# Patient Record
Sex: Male | Born: 1965 | Race: Black or African American | Hispanic: No | Marital: Single | Smoking: Former smoker
Health system: Southern US, Community
[De-identification: ages and names within clinical notes are randomized; demographics above are authoritative.]

## PROBLEM LIST (undated history)

## (undated) DIAGNOSIS — I10 Essential (primary) hypertension: Secondary | ICD-10-CM

## (undated) DIAGNOSIS — E785 Hyperlipidemia, unspecified: Secondary | ICD-10-CM

## (undated) DIAGNOSIS — I2510 Atherosclerotic heart disease of native coronary artery without angina pectoris: Secondary | ICD-10-CM

## (undated) HISTORY — DX: Hyperlipidemia, unspecified: E78.5

## (undated) HISTORY — PX: CARDIAC CATHETERIZATION: SHX172

## (undated) HISTORY — DX: Atherosclerotic heart disease of native coronary artery without angina pectoris: I25.10

---

## 2008-03-02 ENCOUNTER — Emergency Department: Payer: Self-pay | Admitting: Internal Medicine

## 2009-05-19 ENCOUNTER — Emergency Department: Payer: Self-pay | Admitting: Emergency Medicine

## 2009-11-10 ENCOUNTER — Ambulatory Visit: Payer: Self-pay | Admitting: Cardiovascular Disease

## 2009-11-10 ENCOUNTER — Inpatient Hospital Stay: Payer: Self-pay | Admitting: Internal Medicine

## 2011-11-02 ENCOUNTER — Emergency Department: Payer: Self-pay | Admitting: Emergency Medicine

## 2016-08-11 ENCOUNTER — Encounter: Payer: Self-pay | Admitting: Emergency Medicine

## 2016-08-11 DIAGNOSIS — F17210 Nicotine dependence, cigarettes, uncomplicated: Secondary | ICD-10-CM | POA: Insufficient documentation

## 2016-08-11 DIAGNOSIS — N390 Urinary tract infection, site not specified: Secondary | ICD-10-CM | POA: Insufficient documentation

## 2016-08-11 DIAGNOSIS — Z5181 Encounter for therapeutic drug level monitoring: Secondary | ICD-10-CM | POA: Insufficient documentation

## 2016-08-11 DIAGNOSIS — I10 Essential (primary) hypertension: Secondary | ICD-10-CM | POA: Insufficient documentation

## 2016-08-11 NOTE — ED Triage Notes (Signed)
Pt presents to ED with headache and elevated blood pressure for the past few days. Pt states he has been taking his medications as prescribed. Pt currently has clear speech and steady gait. No distress noted. Appears slighly anxious. Very talkative in triage.

## 2016-08-12 ENCOUNTER — Emergency Department
Admission: EM | Admit: 2016-08-12 | Discharge: 2016-08-12 | Disposition: A | Discharge status: Home / Self Care | Payer: Medicare Other | Attending: Emergency Medicine | Admitting: Emergency Medicine

## 2016-08-12 ENCOUNTER — Emergency Department: Payer: Medicare Other

## 2016-08-12 DIAGNOSIS — I10 Essential (primary) hypertension: Secondary | ICD-10-CM

## 2016-08-12 DIAGNOSIS — N390 Urinary tract infection, site not specified: Secondary | ICD-10-CM

## 2016-08-12 HISTORY — DX: Essential (primary) hypertension: I10

## 2016-08-12 LAB — URINALYSIS, COMPLETE (UACMP) WITH MICROSCOPIC
Bilirubin Urine: NEGATIVE
Glucose, UA: NEGATIVE mg/dL
Ketones, ur: NEGATIVE mg/dL
Nitrite: NEGATIVE
Protein, ur: NEGATIVE mg/dL
Specific Gravity, Urine: 1.021 (ref 1.005–1.030)
pH: 5 (ref 5.0–8.0)

## 2016-08-12 LAB — TROPONIN I: Troponin I: <0.03 ng/mL (ref <0.03)

## 2016-08-12 LAB — URINE DRUG SCREEN, QUALITATIVE (ARMC ONLY)
Amphetamines, Ur Screen: NOT DETECTED
Barbiturates, Ur Screen: NOT DETECTED
Benzodiazepine, Ur Scrn: NOT DETECTED
Cannabinoid 50 Ng, Ur ~~LOC~~: NOT DETECTED
Cocaine Metabolite,Ur ~~LOC~~: NOT DETECTED
MDMA (Ecstasy)Ur Screen: NOT DETECTED
Methadone Scn, Ur: NOT DETECTED
Opiate, Ur Screen: NOT DETECTED
Phencyclidine (PCP) Ur S: NOT DETECTED
Tricyclic, Ur Screen: NOT DETECTED

## 2016-08-12 LAB — CBC WITH DIFFERENTIAL/PLATELET
Basophils Absolute: 0 10*3/uL (ref 0–0.1)
Basophils Relative: 1 %
Eosinophils Absolute: 0.2 10*3/uL (ref 0–0.7)
Eosinophils Relative: 3 %
HCT: 43.4 % (ref 40.0–52.0)
Hemoglobin: 15.2 g/dL (ref 13.0–18.0)
Lymphocytes Relative: 52 %
Lymphs Abs: 4 10*3/uL — ABNORMAL HIGH (ref 1.0–3.6)
MCH: 30.3 pg (ref 26.0–34.0)
MCHC: 35.2 g/dL (ref 32.0–36.0)
MCV: 86.3 fL (ref 80.0–100.0)
Monocytes Absolute: 0.6 10*3/uL (ref 0.2–1.0)
Monocytes Relative: 8 %
Neutro Abs: 2.8 10*3/uL (ref 1.4–6.5)
Neutrophils Relative %: 36 %
Platelets: 239 10*3/uL (ref 150–440)
RBC: 5.02 MIL/uL (ref 4.40–5.90)
RDW: 14.9 % — ABNORMAL HIGH (ref 11.5–14.5)
WBC: 7.7 10*3/uL (ref 3.8–10.6)

## 2016-08-12 LAB — BASIC METABOLIC PANEL
Anion gap: 6 (ref 5–15)
BUN: 14 mg/dL (ref 6–20)
CO2: 25 mmol/L (ref 22–32)
Calcium: 8.7 mg/dL — ABNORMAL LOW (ref 8.9–10.3)
Chloride: 107 mmol/L (ref 101–111)
Creatinine, Ser: 0.86 mg/dL (ref 0.61–1.24)
GFR calc Af Amer: >60 mL/min (ref >60)
GFR calc non Af Amer: >60 mL/min (ref >60)
Glucose, Bld: 124 mg/dL — ABNORMAL HIGH (ref 65–99)
Potassium: 3.5 mmol/L (ref 3.5–5.1)
Sodium: 138 mmol/L (ref 135–145)

## 2016-08-12 MED ORDER — DOXYCYCLINE HYCLATE 100 MG PO TABS
100.0000 mg | ORAL_TABLET | Freq: Once | ORAL | Status: AC
  Administered 2016-08-12: 100 mg via ORAL
  Filled 2016-08-12: qty 1

## 2016-08-12 MED ORDER — ACETAMINOPHEN 325 MG PO TABS
650.0000 mg | ORAL_TABLET | Freq: Once | ORAL | Status: AC
  Administered 2016-08-12: 650 mg via ORAL
  Filled 2016-08-12: qty 2

## 2016-08-12 MED ORDER — CLONIDINE HCL 0.1 MG PO TABS
0.1000 mg | ORAL_TABLET | Freq: Once | ORAL | Status: AC
  Administered 2016-08-12: 0.1 mg via ORAL
  Filled 2016-08-12: qty 1

## 2016-08-12 MED ORDER — DOXYCYCLINE HYCLATE 50 MG PO CAPS: 100.0000 mg | ORAL_CAPSULE | Freq: Two times a day (BID) | ORAL | 0 refills | Status: DC

## 2016-08-12 NOTE — ED Notes (Signed)
Pt went to x-ray.

## 2016-08-12 NOTE — ED Provider Notes (Signed)
[facility] Emergency Department Provider Note   ____________________________________________   First MD Initiated Contact with Patient 08/12/16 0026     (approximate)  I have reviewed the triage vital signs and the nursing notes.   HISTORY  Chief Complaint Hypertension and Headache    HPI Jeff Rios is a 51 y.o. male with a history of hypertension who presents to the ED from home with a chief complaint of generalized headache secondary to elevated blood pressure. Patient does not recall the name of his antihypertensive, but states "when I'm feeling good eye don't take it like should". Reports he has been taking his BP medicine daily for the past 1-2 weeks. Noted generalized headache for the past several days, which is his every that his blood pressure is elevated. Patient has not been taking his blood pressure at home and does not know what his baseline blood pressure is. Aside from the mild headache, patient denies associated vision changes, neck pain, chest pain, shortness of breath, abdominal pain, nausea, vomiting, dizziness, numbness or tingling. Denies recent travel or trauma. Has not taken anything for his headache.Admits to increased stressors recently.   Past Medical History:  Diagnosis Date  . Hypertension     There are no active problems to display for this patient.   History reviewed. No pertinent surgical history.  Prior to Admission medications   Not on File    Allergies Patient has no known allergies.  No family history on file.  Social History Social History  Substance Use Topics  . Smoking status: Current Every Day Smoker    Packs/day: 1.00    Types: Cigarettes  . Smokeless tobacco: Never Used  . Alcohol use No  Denies illicit drug use  Review of Systems  Constitutional: No fever/chills. Eyes: No visual changes. ENT: No sore throat. Cardiovascular: Denies chest pain. Respiratory: Denies shortness of  breath. Gastrointestinal: No abdominal pain.  No nausea, no vomiting.  No diarrhea.  No constipation. Genitourinary: Negative for dysuria. Musculoskeletal: Negative for back pain. Skin: Negative for rash. Neurological: Positive for headache. Negative for focal weakness or numbness.  10-point ROS otherwise negative.  ____________________________________________   PHYSICAL EXAM:  VITAL SIGNS: ED Triage Vitals  Enc Vitals Group     BP 08/11/16 2136 (!) 151/95     Pulse Rate 08/11/16 2136 82     Resp 08/11/16 2136 18     Temp 08/11/16 2136 98.2 F (36.8 C)     Temp Source 08/11/16 2136 Oral     SpO2 08/11/16 2136 97 %     Weight 08/11/16 2137 160 lb (72.6 kg)     Height 08/11/16 2137  (1.6 m)     Head Circumference --      Peak Flow --      Pain Score 08/11/16 2135 8     Pain Loc --      Pain Edu? --      Excl. in GC? --     Constitutional: Alert and oriented. Well appearing and in no acute distress. Eyes: Conjunctivae are normal. PERRL. EOMI. Head: Atraumatic. Nose: No congestion/rhinnorhea. Mouth/Throat: Mucous membranes are moist.  Oropharynx non-erythematous. Neck: No stridor.  No carotid bruits.  Supple neck without meningismus. Cardiovascular: Normal rate, regular rhythm. Grossly normal heart sounds.  Good peripheral circulation. Respiratory: Normal respiratory effort.  No retractions. Lungs CTAB. Gastrointestinal: Soft and nontender. No distention. No abdominal bruits. No CVA tenderness. Musculoskeletal: No lower extremity tenderness nor edema.  No joint effusions.  Neurologic:  Normal speech and language. No gross focal neurologic deficits are appreciated. MAEx4. No gait instability. Skin:  Skin is warm, dry and intact. No rash noted. Psychiatric: Mood and affect are normal. Speech and behavior are normal.  ____________________________________________   LABS (all labs ordered are listed, but only abnormal results are displayed)  Labs Reviewed  CBC WITH  DIFFERENTIAL/PLATELET - Abnormal; Notable for the following:       Result Value   RDW 14.9 (*)    Lymphs Abs 4.0 (*)    All other components within normal limits  BASIC METABOLIC PANEL - Abnormal; Notable for the following:    Glucose, Bld 124 (*)    Calcium 8.7 (*)    All other components within normal limits  URINALYSIS, COMPLETE (UACMP) WITH MICROSCOPIC - Abnormal; Notable for the following:    Color, Urine YELLOW (*)    APPearance HAZY (*)    Hgb urine dipstick SMALL (*)    Leukocytes, UA MODERATE (*)    Bacteria, UA RARE (*)    Squamous Epithelial / LPF 0-5 (*)    Crystals PRESENT (*)    All other components within normal limits  TROPONIN I  URINE DRUG SCREEN, QUALITATIVE ([facility] ONLY)   ____________________________________________  EKG  ED ECG REPORT I, Kylani Wires J, the attending physician, personally viewed and interpreted this ECG.   Date: 08/12/2016  EKG Time: 0054  Rate: 78  Rhythm: normal EKG, normal sinus rhythm  Axis: RAD  Intervals:none  ST&T Change: Nonspecific  ____________________________________________  RADIOLOGY  Chest X-ray interpreted per Dr. Cherly Hensen: No acute cardiopulmonary process seen. ____________________________________________   PROCEDURES  Procedure(s) performed: None  Procedures  Critical Care performed: No  ____________________________________________   INITIAL IMPRESSION / ASSESSMENT AND PLAN / ED COURSE  Pertinent labs & imaging results that were available during my care of the patient were reviewed by me and considered in my medical decision making (see chart for details).  51 year old male with a history of hypertension who presents for generalized headache secondary to elevated blood pressure. He is neurologically intact without focal deficits, and denies illicit drug use. Do not feel CT head is warranted at this time. Will obtain screening lab work, administer clonidine and reassess.  Clinical Course as of Aug 13 315    Wed Aug 12, 2016  1610 Delay secondary to lab processing urine. Updated patient on laboratory urinalysis results. Will place him on doxycycline. Blood pressure currently 118/82 without intervention. Strict return precautions given. Patient verbalizes understanding and agrees with plan of care.  [JS]    Clinical Course User Index [JS] Irean Hong, MD     ____________________________________________   FINAL CLINICAL IMPRESSION(S) / ED DIAGNOSES  Final diagnoses:  Essential hypertension  Urinary tract infection without hematuria, site unspecified      NEW MEDICATIONS STARTED DURING THIS VISIT:  New Prescriptions   No medications on file     Note:  This document was prepared using Dragon voice recognition software and may include unintentional dictation errors.    Irean Hong, MD 08/12/16 [phone]

## 2016-08-12 NOTE — Discharge Instructions (Signed)
1. Take antibiotic as prescribed (doxycycline 100 mg twice daily 7 days). 2. Continue to take your blood pressure medications daily as directed by your doctor.  3. Return to the ER for worsening symptoms, persistent vomiting, difficulty breathing or other concerns.

## 2016-08-12 NOTE — ED Notes (Signed)
Pt returned from xray

## 2018-10-15 ENCOUNTER — Emergency Department
Admission: EM | Admit: 2018-10-15 | Discharge: 2018-10-15 | Disposition: A | Discharge status: Home / Self Care | Payer: Medicare Other | Attending: Emergency Medicine | Admitting: Emergency Medicine

## 2018-10-15 ENCOUNTER — Encounter: Payer: Self-pay | Admitting: Emergency Medicine

## 2018-10-15 ENCOUNTER — Other Ambulatory Visit: Payer: Self-pay

## 2018-10-15 DIAGNOSIS — K641 Second degree hemorrhoids: Secondary | ICD-10-CM | POA: Diagnosis not present

## 2018-10-15 DIAGNOSIS — I10 Essential (primary) hypertension: Secondary | ICD-10-CM | POA: Insufficient documentation

## 2018-10-15 DIAGNOSIS — F17210 Nicotine dependence, cigarettes, uncomplicated: Secondary | ICD-10-CM | POA: Diagnosis not present

## 2018-10-15 DIAGNOSIS — K6289 Other specified diseases of anus and rectum: Secondary | ICD-10-CM | POA: Diagnosis present

## 2018-10-15 MED ORDER — HYDROCODONE-ACETAMINOPHEN 5-325 MG PO TABS: 1.0000 | ORAL_TABLET | Freq: Four times a day (QID) | ORAL | 0 refills | Status: AC | PRN

## 2018-10-15 MED ORDER — DIBUCAINE (PERIANAL) 1 % EX OINT: 1.0000 "application " | TOPICAL_OINTMENT | CUTANEOUS | 0 refills | Status: AC | PRN

## 2018-10-15 MED ORDER — HYDROCORTISONE ACETATE 25 MG RE SUPP: 25.0000 mg | Freq: Two times a day (BID) | RECTAL | 1 refills | Status: DC

## 2018-10-15 MED ORDER — ONDANSETRON 4 MG PO TBDP
4.0000 mg | ORAL_TABLET | Freq: Once | ORAL | Status: AC
  Administered 2018-10-15: 4 mg via ORAL
  Filled 2018-10-15: qty 1

## 2018-10-15 MED ORDER — HYDROCODONE-ACETAMINOPHEN 5-325 MG PO TABS
1.0000 | ORAL_TABLET | Freq: Once | ORAL | Status: AC
  Administered 2018-10-15: 1 via ORAL
  Filled 2018-10-15: qty 1

## 2018-10-15 MED ORDER — IBUPROFEN 800 MG PO TABS
800.0000 mg | ORAL_TABLET | Freq: Once | ORAL | Status: AC
  Administered 2018-10-15: 800 mg via ORAL
  Filled 2018-10-15: qty 1

## 2018-10-15 NOTE — ED Triage Notes (Signed)
Pt arrived via POV with reports of hemorrhoid pain states worse since yesterday, pt states he has used Anusol in the past which helped, but does not have any more.  Pt has tried using warm water. Pt requesting something to make them go away and not return. Has hx of hemorrhoids.

## 2018-10-15 NOTE — ED Provider Notes (Signed)
[facility] Emergency Department Provider Note  ____________________________________________  Time seen: Approximately 3:32 PM  I have reviewed the triage vital signs and the nursing notes.   HISTORY  Chief Complaint Rectal Pain (Hemorrhoids)    HPI Jeff Rios is a 53 y.o. male presents to the emergency department with 8 out of 10 acute hemorrhoid pain.  Patient states that he has a history of hemorrhoids and has always had to use Anusol in the past.  Patient states that he is out of Anusol.  Patient states that he only feels hemorrhoid pain with straining.  Pain became worse last night patient became concerned.  He denies recent constipation.  No other alleviating measures have been attempted.        Past Medical History:  Diagnosis Date  . Hypertension     There are no active problems to display for this patient.   History reviewed. No pertinent surgical history.  Prior to Admission medications   Medication Sig Start Date End Date Taking? Authorizing Provider  dibucaine (NUPERCAINAL) 1 % OINT Place 1 application rectally as needed for up to 5 days for hemorrhoids. 10/15/18 10/20/18  Orvil FeilWoods, Harlean Regula M, PA-C  doxycycline (VIBRAMYCIN) 50 MG capsule Take 2 capsules (100 mg total) by mouth 2 (two) times daily. 08/12/16   Irean HongSung, Jade J, MD  HYDROcodone-acetaminophen (NORCO) 5-325 MG tablet Take 1 tablet by mouth every 6 (six) hours as needed for up to 2 days for moderate pain. 10/15/18 10/17/18  Orvil FeilWoods, Valleri Hendricksen M, PA-C  hydrocortisone (ANUSOL-HC) 25 MG suppository Place 1 suppository (25 mg total) rectally every 12 (twelve) hours. 10/15/18 10/15/19  Orvil FeilWoods, Leeland Lovelady M, PA-C    Allergies Patient has no known allergies.  History reviewed. No pertinent family history.  Social History Social History   Tobacco Use  . Smoking status: Current Every Day Smoker    Packs/day: 1.00    Types: Cigarettes  . Smokeless tobacco: Never Used  Substance Use Topics  .  Alcohol use: No  . Drug use: No     Review of Systems  Constitutional: No fever/chills Eyes: No visual changes. No discharge ENT: No upper respiratory complaints. Cardiovascular: no chest pain. Respiratory: no cough. No SOB. Gastrointestinal: No abdominal pain.  No nausea, no vomiting.  No diarrhea.  No constipation.  Patient has hemorrhoid pain. Genitourinary: Negative for dysuria. No hematuria Musculoskeletal: Negative for musculoskeletal pain. Neurological: Negative for headaches, focal weakness or numbness.   ____________________________________________   PHYSICAL EXAM:  VITAL SIGNS: ED Triage Vitals  Enc Vitals Group     BP 10/15/18 1237 (!) 161/82     Pulse Rate 10/15/18 1237 (!) 102     Resp 10/15/18 1237 16     Temp 10/15/18 1237 98.6 F (37 C)     Temp Source 10/15/18 1237 Oral     SpO2 10/15/18 1237 98 %     Weight 10/15/18 1237 170 lb (77.1 kg)     Height 10/15/18 1237 5\' 4"  (1.626 m)     Head Circumference --      Peak Flow --      Pain Score 10/15/18 1252 7     Pain Loc --      Pain Edu? --      Excl. in GC? --      Constitutional: Alert and oriented. Well appearing and in no acute distress. Eyes: Conjunctivae are normal. PERRL. EOMI. Head: Atraumatic. Cardiovascular: Normal rate, regular rhythm. Normal S1 and S2.  Good peripheral circulation. Respiratory:  Normal respiratory effort without tachypnea or retractions. Lungs CTAB. Good air entry to the bases with no decreased or absent breath sounds. Gastrointestinal: Bowel sounds 4 quadrants. Soft and nontender to palpation. No guarding or rigidity. No palpable masses. No distention. No CVA tenderness.  Patient has a grade 2 hemorrhoids. Musculoskeletal: Full range of motion to all extremities. No gross deformities appreciated. Neurologic:  Normal speech and language. No gross focal neurologic deficits are appreciated.  Skin:  Skin is warm, dry and intact. No rash noted. Psychiatric: Mood and affect are  normal. Speech and behavior are normal. Patient exhibits appropriate insight and judgement.   ____________________________________________   LABS (all labs ordered are listed, but only abnormal results are displayed)  Labs Reviewed - No data to display ____________________________________________  EKG   ____________________________________________  RADIOLOGY   No results found.  ____________________________________________    PROCEDURES  Procedure(s) performed:    Procedures    Medications  HYDROcodone-acetaminophen (NORCO/VICODIN) 5-325 MG per tablet 1 tablet (1 tablet Oral Given 10/15/18 1540)  ibuprofen (ADVIL) tablet 800 mg (800 mg Oral Given 10/15/18 1541)  ondansetron (ZOFRAN-ODT) disintegrating tablet 4 mg (4 mg Oral Given 10/15/18 1541)     ____________________________________________   INITIAL IMPRESSION / ASSESSMENT AND PLAN / ED COURSE  Pertinent labs & imaging results that were available during my care of the patient were reviewed by me and considered in my medical decision making (see chart for details).  Review of the [place] CSRS was performed in accordance of the [place] prior to dispensing any controlled drugs.           Assessment and Plan:  Hemorrhoids Patient presents to the emergency department with hemorrhoid pain.  Patient has grade 2 hemorrhoids on physical exam.  Patient was discharged with Anusol, dibucaine and a short course of Norco.  He was advised to follow-up with general surgery as needed.  He voiced understanding regarding post ED care.  All patient questions were answered.    ____________________________________________  FINAL CLINICAL IMPRESSION(S) / ED DIAGNOSES  Final diagnoses:  Grade II hemorrhoids      NEW MEDICATIONS STARTED DURING THIS VISIT:  ED Discharge Orders         Ordered    hydrocortisone (ANUSOL-HC) 25 MG suppository  Every 12 hours     10/15/18 1557    dibucaine (NUPERCAINAL) 1 % OINT  As needed      10/15/18 1557    HYDROcodone-acetaminophen (NORCO) 5-325 MG tablet  Every 6 hours PRN     10/15/18 1557              This chart was dictated using voice recognition software/Dragon. Despite best efforts to proofread, errors can occur which can change the meaning. Any change was purely unintentional.    Lannie Fields, PA-C 10/15/18 1600    Harvest Dark, MD 10/15/18 2141

## 2018-10-15 NOTE — Discharge Instructions (Signed)
You have been diagnosed with hemorrhoids. You can apply dibucaine ointment for rectal pain. Use Anusol. Make sure to take Epson salt baths.

## 2019-03-05 ENCOUNTER — Emergency Department
Admission: EM | Admit: 2019-03-05 | Discharge: 2019-03-06 | Disposition: A | Discharge status: Home / Self Care | Payer: Medicare Other | Attending: Emergency Medicine | Admitting: Emergency Medicine

## 2019-03-05 ENCOUNTER — Emergency Department: Payer: Medicare Other

## 2019-03-05 ENCOUNTER — Encounter: Payer: Self-pay | Admitting: Emergency Medicine

## 2019-03-05 ENCOUNTER — Other Ambulatory Visit: Payer: Self-pay

## 2019-03-05 DIAGNOSIS — Z79899 Other long term (current) drug therapy: Secondary | ICD-10-CM | POA: Insufficient documentation

## 2019-03-05 DIAGNOSIS — T783XXA Angioneurotic edema, initial encounter: Secondary | ICD-10-CM | POA: Insufficient documentation

## 2019-03-05 DIAGNOSIS — F17210 Nicotine dependence, cigarettes, uncomplicated: Secondary | ICD-10-CM | POA: Insufficient documentation

## 2019-03-05 DIAGNOSIS — I10 Essential (primary) hypertension: Secondary | ICD-10-CM | POA: Diagnosis not present

## 2019-03-05 MED ORDER — EPINEPHRINE 0.3 MG/0.3ML IJ SOAJ: 0.3000 mg | INTRAMUSCULAR | 1 refills | Status: AC | PRN

## 2019-03-05 MED ORDER — METOPROLOL TARTRATE 25 MG PO TABS: 12.5000 mg | ORAL_TABLET | Freq: Two times a day (BID) | ORAL | 0 refills | Status: DC

## 2019-03-05 MED ORDER — DIPHENHYDRAMINE HCL 50 MG/ML IJ SOLN
12.5000 mg | Freq: Once | INTRAMUSCULAR | Status: AC
  Administered 2019-03-05: 12.5 mg via INTRAVENOUS
  Filled 2019-03-05: qty 1

## 2019-03-05 MED ORDER — SODIUM CHLORIDE 0.9 % IV BOLUS
1000.0000 mL | Freq: Once | INTRAVENOUS | Status: AC
  Administered 2019-03-05: 1000 mL via INTRAVENOUS

## 2019-03-05 MED ORDER — HYDROCHLOROTHIAZIDE 12.5 MG PO TABS: 12.5000 mg | ORAL_TABLET | Freq: Every day | ORAL | 0 refills | Status: DC

## 2019-03-05 MED ORDER — METHYLPREDNISOLONE SODIUM SUCC 125 MG IJ SOLR
125.0000 mg | Freq: Once | INTRAMUSCULAR | Status: AC
  Administered 2019-03-05: 125 mg via INTRAVENOUS
  Filled 2019-03-05: qty 2

## 2019-03-05 MED ORDER — FAMOTIDINE IN NACL 20-0.9 MG/50ML-% IV SOLN
20.0000 mg | Freq: Once | INTRAVENOUS | Status: AC
  Administered 2019-03-05: 20 mg via INTRAVENOUS
  Filled 2019-03-05: qty 50

## 2019-03-05 MED ORDER — PREDNISONE 20 MG PO TABS: 40.0000 mg | ORAL_TABLET | Freq: Every day | ORAL | 0 refills | Status: AC

## 2019-03-05 NOTE — Discharge Instructions (Addendum)
You presented with swelling.  You should take the steroids for the next 5 days.  It is possible that this could be secondary to the amlodipine so we will discontinue this.  We have restarted a new medication to help with your blood pressure called metoprolol. Wait into the swelling comes down to restart this just so that we can tell if there is a reaction to it.  You should follow with your primary care doctor in one week to have BP check and they can increase medications if needed.   Use the EpiPen only for a new reaction that causes shortness of breath.

## 2019-03-05 NOTE — ED Provider Notes (Addendum)
[facility] Emergency Department Provider Note  ____________________________________________   First MD Initiated Contact with Patient 03/05/19 [phone]     (approximate)  I have reviewed the triage vital signs and the nursing notes.   HISTORY  Chief Complaint Allergic Reaction    HPI Jeff Rios is a 53 y.o. male with hypertension on amlodipine who presents with allergic reaction.  Patient does not remember being stung by anything but around 430 he looked in the mirror and noted that he had increased swelling of his lips, tongue, around his eyes.  He denies any shortness of breath or feeling any scratchiness in his throat.  Denies any wheezing or stridor.  He denies ever having this previous.  Denies any ACE inhibitor use.  Swelling onset 430, constant, staying about constant, nothing brought it on, EMS gave 50 of p.o. Benadryl        Past Medical History:  Diagnosis Date  . Hypertension     There are no active problems to display for this patient.   No past surgical history on file.  Prior to Admission medications   Medication Sig Start Date End Date Taking? Authorizing Provider  doxycycline (VIBRAMYCIN) 50 MG capsule Take 2 capsules (100 mg total) by mouth 2 (two) times daily. 08/12/16   Irean Hong, MD  hydrocortisone (ANUSOL-HC) 25 MG suppository Place 1 suppository (25 mg total) rectally every 12 (twelve) hours. 10/15/18 10/15/19  Orvil Feil, PA-C    Allergies Patient has no known allergies.  No family history on file.  Social History Social History   Tobacco Use  . Smoking status: Current Every Day Smoker    Packs/day: 1.00    Types: Cigarettes  . Smokeless tobacco: Never Used  Substance Use Topics  . Alcohol use: No  . Drug use: No      Review of Systems Constitutional: No fever/chills Eyes: No visual changes. ENT: No sore throat. + facial swelling  Cardiovascular: Denies chest pain. Respiratory: Denies  shortness of breath. Gastrointestinal: No abdominal pain.  No nausea, no vomiting.  No diarrhea.  No constipation. Genitourinary: Negative for dysuria. Musculoskeletal: Negative for back pain. Skin: Negative for rash. Neurological: Negative for headaches, focal weakness or numbness. All other ROS negative ____________________________________________   PHYSICAL EXAM:  VITAL SIGNS: Blood pressure (!) 141/86, pulse (!) 105, temperature 97.6 F (36.4 C), temperature source Oral, resp. rate (!) 24, height 5\' 2"  (1.575 m), weight 79.4 kg, SpO2 94 %.  Constitutional: Alert and oriented. Well appearing and in no acute distress. Eyes: Conjunctivae are normal. EOMI. mild swelling around the eyes. Head: Atraumatic. Nose: No congestion/rhinnorhea. Mouth/Throat: Slight swelling of the lips and the tongue.  Oropharynx appears clear. Neck: No stridor. Trachea Midline. FROM Cardiovascular: tachycardiac regular rhythm. Grossly normal heart sounds.  Good peripheral circulation. Respiratory: Normal respiratory effort.  No retractions. Lungs CTAB. Gastrointestinal: Soft and nontender. No distention. No abdominal bruits.  Musculoskeletal: No lower extremity tenderness nor edema.  No joint effusions. Neurologic:  Normal speech and language. No gross focal neurologic deficits are appreciated.  Skin:  Skin is warm, dry and intact. No rash noted. Psychiatric: Mood and affect are normal. Speech and behavior are normal. GU: Deferred   ____________________________________________   ED ECG REPORT I, , the attending physician, personally viewed and interpreted this ECG.  EKG is sinus tachycardia rate of 107, no ST elevation, no T inversion, normal intervals. ____________________________________________ PROCEDURES  Procedure(s) performed (including Critical Care):  Procedures  ____________________________________________   INITIAL IMPRESSION / ASSESSMENT AND PLAN / ED COURSE  Jeff Rios was evaluated in Emergency Department on 03/05/2019 for the symptoms described in the history of present illness. He was evaluated in the context of the global COVID-19 pandemic, which necessitated consideration that the patient might be at risk for infection with the SARS-CoV-2 virus that causes COVID-19. Institutional protocols and algorithms that pertain to the evaluation of patients at risk for COVID-19 are in a state of rapid change based on information released by regulatory bodies including the CDC and federal and state organizations. These policies and algorithms were followed during the patient's care in the ED.    Patient is a 53 year old who presents with sudden onset swelling of his mouth, tongue, eyes.  No oropharynx involvement.  Lungs are clear no evidence of wheezing.  No other system involved such as GI.  Will give patient Benadryl, Pepcid, steroids and continue to closely monitor and will have low threshold to give epinephrine if he develops any other additional symptoms.  No evidence of cellulitis or abscess based on examination.   8:13 PM reevaluated patient has not had any progression of symptoms.  Patient continues to deny any symptoms in the back of his throat.  10:38 PM evaluated patient no progression of swelling.  Lungs sound clear.  Discussed with patient follow-up with primary care doctor and allergist for testing for hereditary angioedema.  Will discharge with 5-day course of prednisone as well as an EpiPen and we discussed appropriate use for that.  Lungs continue sound clear.  Chest x-ray without any evidence of pneumonia or aspiration.  11:25 PM reevaluated patient.  Patient is tolerating p.o.  Patient's swelling is better in nature.  There are some case reports of amlodipine causing angioedema so I discussed with patient holding off on that for now just in case it was the cause of the reaction.  We will start him on a low-dose of metoprolol to help prevent  hypertension.  Patient will hold off on starting that for the next few days until this swelling has come down.  Patient feels comfortable with this plan. Recommend BP check 1 week.      ____________________________________________   FINAL CLINICAL IMPRESSION(S) / ED DIAGNOSES   Final diagnoses:  Angioedema, initial encounter      MEDICATIONS GIVEN DURING THIS VISIT:  Medications  diphenhydrAMINE (BENADRYL) injection 12.5 mg (12.5 mg Intravenous Given 03/05/19 1939)  famotidine (PEPCID) IVPB 20 mg premix (0 mg Intravenous Stopped 03/05/19 2119)  methylPREDNISolone sodium succinate (SOLU-MEDROL) 125 mg/2 mL injection 125 mg (125 mg Intravenous Given 03/05/19 1938)  sodium chloride 0.9 % bolus 1,000 mL (1,000 mLs Intravenous New Bag/Given 03/05/19 2120)     ED Discharge Orders         Ordered    hydrochlorothiazide (HYDRODIURIL) 12.5 MG tablet  Daily,   Status:  Discontinued     03/05/19 2327    predniSONE (DELTASONE) 20 MG tablet  Daily     03/05/19 2329    EPINEPHrine 0.3 mg/0.3 mL IJ SOAJ injection  As needed     03/05/19 2329    metoprolol tartrate (LOPRESSOR) 25 MG tablet  2 times daily     03/05/19 2350           Note:  This document was prepared using Dragon voice recognition software and may include unintentional dictation errors.   Vanessa [place], MD 03/05/19 2332    Vanessa [place], MD 03/05/19 2351

## 2019-03-05 NOTE — ED Triage Notes (Signed)
PT to ED via [facility] from home with c/o allergic reaction. pt has taken amlodipine and naproxen for a while, no new medications started recently. Pt states he ate chocolate today and noticed tongue started swelling and rash to body around 630pm.  50mg  bendryl iv given ems. Pt currently alert and oriented. Pt respirations even and unlabored. Swelling noted to tongue and lips. Pt denies shortness of breath or throat pain at this time. VSS for ems. MD funke at bedside.

## 2019-03-06 NOTE — ED Notes (Signed)
E-signature not working at this time. Pt verbalized understanding of D/C instructions, prescriptions and follow up care with no further questions at this time. Pt in NAD and ambulatory at time of D/C.  

## 2019-03-18 ENCOUNTER — Encounter: Payer: Self-pay | Admitting: Emergency Medicine

## 2019-03-18 ENCOUNTER — Inpatient Hospital Stay
Admission: EM | Admit: 2019-03-18 | Discharge: 2019-03-21 | DRG: 247 | Disposition: A | Discharge status: Home / Self Care | Payer: Medicare Other | Attending: Internal Medicine | Admitting: Internal Medicine

## 2019-03-18 ENCOUNTER — Emergency Department: Payer: Medicare Other

## 2019-03-18 ENCOUNTER — Emergency Department
Admission: EM | Admit: 2019-03-18 | Discharge: 2019-03-18 | Discharge status: Absent without leave | Payer: Medicare Other | Source: Home / Self Care

## 2019-03-18 ENCOUNTER — Encounter
Admission: EM | Disposition: A | Discharge status: Home / Self Care | Payer: Self-pay | Source: Home / Self Care | Attending: Internal Medicine

## 2019-03-18 ENCOUNTER — Inpatient Hospital Stay (HOSPITAL_COMMUNITY)
Admit: 2019-03-18 | Discharge: 2019-03-18 | Disposition: A | Discharge status: Home / Self Care | Payer: Medicare Other | Attending: Cardiovascular Disease | Admitting: Cardiovascular Disease

## 2019-03-18 ENCOUNTER — Other Ambulatory Visit: Payer: Self-pay

## 2019-03-18 DIAGNOSIS — F17210 Nicotine dependence, cigarettes, uncomplicated: Secondary | ICD-10-CM | POA: Diagnosis present

## 2019-03-18 DIAGNOSIS — I422 Other hypertrophic cardiomyopathy: Secondary | ICD-10-CM | POA: Diagnosis present

## 2019-03-18 DIAGNOSIS — Z720 Tobacco use: Secondary | ICD-10-CM

## 2019-03-18 DIAGNOSIS — I249 Acute ischemic heart disease, unspecified: Secondary | ICD-10-CM

## 2019-03-18 DIAGNOSIS — I214 Non-ST elevation (NSTEMI) myocardial infarction: Secondary | ICD-10-CM | POA: Diagnosis present

## 2019-03-18 DIAGNOSIS — I200 Unstable angina: Secondary | ICD-10-CM

## 2019-03-18 DIAGNOSIS — R079 Chest pain, unspecified: Secondary | ICD-10-CM | POA: Diagnosis present

## 2019-03-18 DIAGNOSIS — I2510 Atherosclerotic heart disease of native coronary artery without angina pectoris: Secondary | ICD-10-CM | POA: Diagnosis not present

## 2019-03-18 DIAGNOSIS — R001 Bradycardia, unspecified: Secondary | ICD-10-CM | POA: Diagnosis not present

## 2019-03-18 DIAGNOSIS — F17200 Nicotine dependence, unspecified, uncomplicated: Secondary | ICD-10-CM | POA: Diagnosis not present

## 2019-03-18 DIAGNOSIS — E785 Hyperlipidemia, unspecified: Secondary | ICD-10-CM | POA: Diagnosis present

## 2019-03-18 DIAGNOSIS — Z20828 Contact with and (suspected) exposure to other viral communicable diseases: Secondary | ICD-10-CM | POA: Diagnosis present

## 2019-03-18 DIAGNOSIS — R072 Precordial pain: Secondary | ICD-10-CM

## 2019-03-18 DIAGNOSIS — R739 Hyperglycemia, unspecified: Secondary | ICD-10-CM | POA: Diagnosis present

## 2019-03-18 DIAGNOSIS — I25110 Atherosclerotic heart disease of native coronary artery with unstable angina pectoris: Secondary | ICD-10-CM | POA: Diagnosis present

## 2019-03-18 DIAGNOSIS — I10 Essential (primary) hypertension: Secondary | ICD-10-CM

## 2019-03-18 HISTORY — PX: CORONARY/GRAFT ACUTE MI REVASCULARIZATION: CATH118305

## 2019-03-18 HISTORY — PX: LEFT HEART CATH AND CORONARY ANGIOGRAPHY: CATH118249

## 2019-03-18 HISTORY — PX: CORONARY STENT INTERVENTION: CATH118234

## 2019-03-18 LAB — CBC WITH DIFFERENTIAL/PLATELET
Abs Immature Granulocytes: 0.03 10*3/uL (ref 0.00–0.07)
Basophils Absolute: 0 10*3/uL (ref 0.0–0.1)
Basophils Relative: 0 %
Eosinophils Absolute: 0.1 10*3/uL (ref 0.0–0.5)
Eosinophils Relative: 1 %
Hemoglobin: 15.2 g/dL (ref 13.0–17.0)
Immature Granulocytes: 0 %
Lymphocytes Relative: 29 %
Lymphs Abs: 2.2 10*3/uL (ref 0.7–4.0)
Monocytes Absolute: 0.7 10*3/uL (ref 0.1–1.0)
Monocytes Relative: 9 %
Neutro Abs: 4.5 10*3/uL (ref 1.7–7.7)
Neutrophils Relative %: 61 %
Platelets: 248 10*3/uL (ref 150–400)
WBC: 7.5 10*3/uL (ref 4.0–10.5)

## 2019-03-18 LAB — HEMOGLOBIN A1C
Hgb A1c MFr Bld: 5.4 % (ref 4.8–5.6)
Mean Plasma Glucose: 108.28 mg/dL

## 2019-03-18 LAB — URINE DRUG SCREEN, QUALITATIVE (ARMC ONLY)
Amphetamines, Ur Screen: NOT DETECTED
Barbiturates, Ur Screen: NOT DETECTED
Benzodiazepine, Ur Scrn: POSITIVE — AB
Cannabinoid 50 Ng, Ur ~~LOC~~: NOT DETECTED
Cocaine Metabolite,Ur ~~LOC~~: NOT DETECTED
MDMA (Ecstasy)Ur Screen: NOT DETECTED
Methadone Scn, Ur: NOT DETECTED
Opiate, Ur Screen: POSITIVE — AB
Phencyclidine (PCP) Ur S: NOT DETECTED
Tricyclic, Ur Screen: NOT DETECTED

## 2019-03-18 LAB — APTT: aPTT: 26 seconds (ref 24–36)

## 2019-03-18 LAB — COMPREHENSIVE METABOLIC PANEL
ALT: 21 U/L (ref 0–44)
AST: 20 U/L (ref 15–41)
Albumin: 4 g/dL (ref 3.5–5.0)
Alkaline Phosphatase: 54 U/L (ref 38–126)
Anion gap: 8 (ref 5–15)
BUN: 16 mg/dL (ref 6–20)
CO2: 25 mmol/L (ref 22–32)
Calcium: 8.7 mg/dL — ABNORMAL LOW (ref 8.9–10.3)
Chloride: 102 mmol/L (ref 98–111)
Creatinine, Ser: 1.11 mg/dL (ref 0.61–1.24)
GFR calc Af Amer: >60 mL/min (ref >60)
GFR calc non Af Amer: >60 mL/min (ref >60)
Glucose, Bld: 110 mg/dL — ABNORMAL HIGH (ref 70–99)
Potassium: 3.5 mmol/L (ref 3.5–5.1)
Sodium: 135 mmol/L (ref 135–145)
Total Bilirubin: 0.7 mg/dL (ref 0.3–1.2)
Total Protein: 7 g/dL (ref 6.5–8.1)

## 2019-03-18 LAB — TROPONIN I (HIGH SENSITIVITY)
Troponin I (High Sensitivity): 59 ng/L — ABNORMAL HIGH (ref <18)
Troponin I (High Sensitivity): 210 ng/L (ref <18)
Troponin I (High Sensitivity): 876 ng/L (ref <18)
Troponin I (High Sensitivity): 24643 ng/L (ref <18)

## 2019-03-18 LAB — PROTIME-INR
INR: 1 (ref 0.8–1.2)
Prothrombin Time: 12.7 seconds (ref 11.4–15.2)

## 2019-03-18 LAB — LIPID PANEL
Cholesterol: 164 mg/dL (ref 0–200)
HDL: 42 mg/dL (ref >40)
LDL Cholesterol: 116 mg/dL — ABNORMAL HIGH (ref 0–99)
Total CHOL/HDL Ratio: 3.9 RATIO
Triglycerides: 32 mg/dL (ref <150)
VLDL: 6 mg/dL (ref 0–40)

## 2019-03-18 LAB — ECHOCARDIOGRAM COMPLETE
Height: 64 in
Weight: 2868.8 oz

## 2019-03-18 LAB — LIPASE, BLOOD: Lipase: 38 U/L (ref 11–51)

## 2019-03-18 LAB — SARS CORONAVIRUS 2 BY RT PCR (HOSPITAL ORDER, PERFORMED IN ~~LOC~~ HOSPITAL LAB): SARS Coronavirus 2: NEGATIVE

## 2019-03-18 LAB — SARS CORONAVIRUS 2 (TAT 6-24 HRS): SARS Coronavirus 2: NEGATIVE

## 2019-03-18 LAB — MRSA PCR SCREENING: MRSA by PCR: NEGATIVE

## 2019-03-18 LAB — ETHANOL: Alcohol, Ethyl (B): <10 mg/dL (ref <10)

## 2019-03-18 LAB — HIV ANTIBODY (ROUTINE TESTING W REFLEX): HIV Screen 4th Generation wRfx: NONREACTIVE

## 2019-03-18 SURGERY — CORONARY/GRAFT ACUTE MI REVASCULARIZATION
Anesthesia: Moderate Sedation

## 2019-03-18 MED ORDER — MIDAZOLAM HCL 2 MG/2ML IJ SOLN
INTRAMUSCULAR | Status: DC | PRN
  Administered 2019-03-18: 1 mg via INTRAVENOUS

## 2019-03-18 MED ORDER — PRASUGREL HCL 10 MG PO TABS
10.0000 mg | ORAL_TABLET | Freq: Every day | ORAL | Status: DC
  Administered 2019-03-19 – 2019-03-21 (×2): 10 mg via ORAL
  Filled 2019-03-18 (×3): qty 1

## 2019-03-18 MED ORDER — HEPARIN (PORCINE) 25000 UT/250ML-% IV SOLN
850.0000 [IU]/h | INTRAVENOUS | Status: DC
  Administered 2019-03-18: 850 [IU]/h via INTRAVENOUS
  Filled 2019-03-18: qty 250

## 2019-03-18 MED ORDER — HEPARIN (PORCINE) IN NACL 1000-0.9 UT/500ML-% IV SOLN
INTRAVENOUS | Status: AC
  Filled 2019-03-18: qty 1000

## 2019-03-18 MED ORDER — HEPARIN SODIUM (PORCINE) 1000 UNIT/ML IJ SOLN
INTRAMUSCULAR | Status: DC | PRN
  Administered 2019-03-18: 3000 [IU] via INTRAVENOUS
  Administered 2019-03-18: 4000 [IU] via INTRAVENOUS

## 2019-03-18 MED ORDER — SODIUM CHLORIDE 0.9 % WEIGHT BASED INFUSION: 3.0000 mL/kg/h | INTRAVENOUS | Status: DC

## 2019-03-18 MED ORDER — SODIUM CHLORIDE 0.9% FLUSH
3.0000 mL | Freq: Two times a day (BID) | INTRAVENOUS | Status: DC
  Administered 2019-03-18 – 2019-03-19 (×3): 3 mL via INTRAVENOUS

## 2019-03-18 MED ORDER — VERAPAMIL HCL 2.5 MG/ML IV SOLN
INTRAVENOUS | Status: DC | PRN
  Administered 2019-03-18: 2.5 mg via INTRA_ARTERIAL

## 2019-03-18 MED ORDER — ATROPINE SULFATE 1 MG/10ML IJ SOSY
PREFILLED_SYRINGE | INTRAMUSCULAR | Status: AC
  Filled 2019-03-18: qty 10

## 2019-03-18 MED ORDER — ATORVASTATIN CALCIUM 20 MG PO TABS
40.0000 mg | ORAL_TABLET | Freq: Every day | ORAL | Status: DC
  Administered 2019-03-18 – 2019-03-19 (×2): 40 mg via ORAL
  Filled 2019-03-18 (×2): qty 2

## 2019-03-18 MED ORDER — HEPARIN BOLUS VIA INFUSION
4000.0000 [IU] | Freq: Once | INTRAVENOUS | Status: AC
  Administered 2019-03-18: 4000 [IU] via INTRAVENOUS
  Filled 2019-03-18: qty 4000

## 2019-03-18 MED ORDER — MORPHINE SULFATE (PF) 2 MG/ML IV SOLN
2.0000 mg | INTRAVENOUS | Status: DC | PRN
  Administered 2019-03-18: 2 mg via INTRAVENOUS
  Filled 2019-03-18: qty 1

## 2019-03-18 MED ORDER — ASPIRIN EC 81 MG PO TBEC
81.0000 mg | DELAYED_RELEASE_TABLET | Freq: Every day | ORAL | Status: DC
  Administered 2019-03-18 – 2019-03-21 (×3): 81 mg via ORAL
  Filled 2019-03-18 (×3): qty 1

## 2019-03-18 MED ORDER — ALPRAZOLAM 0.25 MG PO TABS: 0.2500 mg | ORAL_TABLET | Freq: Two times a day (BID) | ORAL | Status: DC | PRN

## 2019-03-18 MED ORDER — SODIUM CHLORIDE 0.9% FLUSH: 3.0000 mL | Freq: Two times a day (BID) | INTRAVENOUS | Status: DC

## 2019-03-18 MED ORDER — SODIUM CHLORIDE 0.9% FLUSH: 3.0000 mL | INTRAVENOUS | Status: DC | PRN

## 2019-03-18 MED ORDER — SODIUM CHLORIDE 0.9 % IV BOLUS
1000.0000 mL | Freq: Once | INTRAVENOUS | Status: AC
  Administered 2019-03-18: 1000 mL via INTRAVENOUS

## 2019-03-18 MED ORDER — SODIUM CHLORIDE 0.9 % IV SOLN: 250.0000 mL | INTRAVENOUS | Status: DC | PRN

## 2019-03-18 MED ORDER — IOHEXOL 300 MG/ML  SOLN
INTRAMUSCULAR | Status: DC | PRN
  Administered 2019-03-18: 135 mL

## 2019-03-18 MED ORDER — HEPARIN SODIUM (PORCINE) 5000 UNIT/ML IJ SOLN: 4000.0000 [IU] | Freq: Once | INTRAMUSCULAR | Status: DC

## 2019-03-18 MED ORDER — METOPROLOL TARTRATE 25 MG PO TABS: 12.5000 mg | ORAL_TABLET | Freq: Two times a day (BID) | ORAL | Status: DC

## 2019-03-18 MED ORDER — MIDAZOLAM HCL 2 MG/2ML IJ SOLN
INTRAMUSCULAR | Status: AC
  Filled 2019-03-18: qty 2

## 2019-03-18 MED ORDER — PRASUGREL HCL 10 MG PO TABS
ORAL_TABLET | ORAL | Status: AC
  Filled 2019-03-18: qty 6

## 2019-03-18 MED ORDER — ACETAMINOPHEN 325 MG PO TABS
650.0000 mg | ORAL_TABLET | ORAL | Status: DC | PRN
  Administered 2019-03-18 – 2019-03-20 (×3): 650 mg via ORAL
  Filled 2019-03-18 (×2): qty 2

## 2019-03-18 MED ORDER — ZOLPIDEM TARTRATE 5 MG PO TABS: 5.0000 mg | ORAL_TABLET | Freq: Every evening | ORAL | Status: DC | PRN

## 2019-03-18 MED ORDER — NITROGLYCERIN 1 MG/10 ML FOR IR/CATH LAB
INTRA_ARTERIAL | Status: DC | PRN
  Administered 2019-03-18: 100 ug via INTRACORONARY

## 2019-03-18 MED ORDER — PRASUGREL HCL 10 MG PO TABS
ORAL_TABLET | ORAL | Status: DC | PRN
  Administered 2019-03-18: 60 mg via ORAL

## 2019-03-18 MED ORDER — VERAPAMIL HCL 2.5 MG/ML IV SOLN
INTRAVENOUS | Status: AC
  Filled 2019-03-18: qty 2

## 2019-03-18 MED ORDER — ALUM & MAG HYDROXIDE-SIMETH 200-200-20 MG/5ML PO SUSP
30.0000 mL | Freq: Once | ORAL | Status: AC
  Administered 2019-03-18: 30 mL via ORAL
  Filled 2019-03-18: qty 30

## 2019-03-18 MED ORDER — CHLORHEXIDINE GLUCONATE CLOTH 2 % EX PADS
6.0000 | MEDICATED_PAD | Freq: Every day | CUTANEOUS | Status: DC
  Administered 2019-03-18: 6 via TOPICAL

## 2019-03-18 MED ORDER — HEPARIN (PORCINE) IN NACL 1000-0.9 UT/500ML-% IV SOLN
INTRAVENOUS | Status: DC | PRN
  Administered 2019-03-18: 500 mL

## 2019-03-18 MED ORDER — ENOXAPARIN SODIUM 40 MG/0.4ML ~~LOC~~ SOLN
40.0000 mg | SUBCUTANEOUS | Status: DC
  Administered 2019-03-19: 40 mg via SUBCUTANEOUS
  Filled 2019-03-18 (×2): qty 0.4

## 2019-03-18 MED ORDER — ONDANSETRON HCL 4 MG/2ML IJ SOLN: 4.0000 mg | Freq: Four times a day (QID) | INTRAMUSCULAR | Status: DC | PRN

## 2019-03-18 MED ORDER — ATROPINE SULFATE 1 MG/10ML IJ SOSY
PREFILLED_SYRINGE | INTRAMUSCULAR | Status: DC | PRN
  Administered 2019-03-18: 1 mg via INTRAVENOUS

## 2019-03-18 MED ORDER — LIDOCAINE VISCOUS HCL 2 % MT SOLN
15.0000 mL | Freq: Once | OROMUCOSAL | Status: AC
  Administered 2019-03-18: 15 mL via ORAL
  Filled 2019-03-18: qty 15

## 2019-03-18 MED ORDER — NITROGLYCERIN 2 % TD OINT: 1.0000 [in_us] | TOPICAL_OINTMENT | Freq: Four times a day (QID) | TRANSDERMAL | Status: DC

## 2019-03-18 MED ORDER — HEPARIN (PORCINE) 25000 UT/250ML-% IV SOLN: 10.0000 [IU]/kg/h | INTRAVENOUS | Status: DC

## 2019-03-18 MED ORDER — ASPIRIN 81 MG PO CHEW
324.0000 mg | CHEWABLE_TABLET | ORAL | Status: AC
  Administered 2019-03-18: 324 mg via ORAL
  Filled 2019-03-18: qty 4

## 2019-03-18 MED ORDER — ACETAMINOPHEN 500 MG PO TABS
1000.0000 mg | ORAL_TABLET | Freq: Once | ORAL | Status: AC
  Administered 2019-03-18: 1000 mg via ORAL
  Filled 2019-03-18: qty 2

## 2019-03-18 MED ORDER — SODIUM CHLORIDE 0.9 % IV SOLN
INTRAVENOUS | Status: DC
  Administered 2019-03-18: 09:00:00 via INTRAVENOUS

## 2019-03-18 MED ORDER — SODIUM CHLORIDE 0.9 % WEIGHT BASED INFUSION
1.0000 mL/kg/h | INTRAVENOUS | Status: AC
  Administered 2019-03-18 (×2): 1 mL/kg/h via INTRAVENOUS

## 2019-03-18 MED ORDER — SODIUM CHLORIDE 0.9 % WEIGHT BASED INFUSION: 1.0000 mL/kg/h | INTRAVENOUS | Status: DC

## 2019-03-18 MED ORDER — SODIUM CHLORIDE 0.9 % IV SOLN: INTRAVENOUS | Status: DC

## 2019-03-18 MED ORDER — HEPARIN SODIUM (PORCINE) 1000 UNIT/ML IJ SOLN
INTRAMUSCULAR | Status: AC
  Filled 2019-03-18: qty 1

## 2019-03-18 MED ORDER — NITROGLYCERIN 0.4 MG SL SUBL
0.4000 mg | SUBLINGUAL_TABLET | SUBLINGUAL | Status: DC | PRN
  Administered 2019-03-18 (×3): 0.4 mg via SUBLINGUAL
  Filled 2019-03-18 (×3): qty 1

## 2019-03-18 MED ORDER — NITROGLYCERIN 0.4 MG/SPRAY TL SOLN: 1.0000 | Status: DC | PRN

## 2019-03-18 MED ORDER — NITROGLYCERIN 1 MG/10 ML FOR IR/CATH LAB
INTRA_ARTERIAL | Status: AC
  Filled 2019-03-18: qty 10

## 2019-03-18 SURGICAL SUPPLY — 14 items
BALLN MINITREK RX 2.0X12 (BALLOONS) ×2
BALLN ~~LOC~~ EUPHORA RX 2.5X15 (BALLOONS) ×2
BALLOON MINITREK RX 2.0X12 (BALLOONS) ×1 IMPLANT
BALLOON ~~LOC~~ EUPHORA RX 2.5X15 (BALLOONS) ×1 IMPLANT
CATH INFINITI 5FR JK (CATHETERS) ×2 IMPLANT
CATH VISTA GUIDE 6FR JR4 (CATHETERS) ×2 IMPLANT
DEVICE INFLAT 30 PLUS (MISCELLANEOUS) ×2 IMPLANT
DEVICE RAD COMP TR BAND LRG (VASCULAR PRODUCTS) ×2 IMPLANT
GLIDESHEATH SLEND SS 6F .021 (SHEATH) ×2 IMPLANT
KIT MANI 3VAL PERCEP (MISCELLANEOUS) ×2 IMPLANT
PACK CARDIAC CATH (CUSTOM PROCEDURE TRAY) ×2 IMPLANT
STENT RESOLUTE ONYX 2.25X22 (Permanent Stent) ×2 IMPLANT
WIRE ROSEN-J .035X260CM (WIRE) ×2 IMPLANT
WIRE RUNTHROUGH .014X180CM (WIRE) ×2 IMPLANT

## 2019-03-18 NOTE — Progress Notes (Addendum)
Phlebotomy called RN saying patient was having chest pain. On assessment patient reported 9-10/10 substernal chest tightness. BP 115/80, but dropped to 86/60 after 1 SL nitro with no relief in chest pain. O2 applied. BP slightly improved but CP remained 9-10/10. Per Christell Faith PA, gave morphine instead of another nitro. EKG done and given to [facility] who is discussing with MD. Will continue to monitor.  10:40 -- prepping for cardiac cath. Chewable aspirin given as ordered. Patient resting a little better but continues to report 9-10/10 chest pain/tightness. Consent signed. Lab called with critical troponin 876; Christell Faith PA was in the room and notified. Vitals stable at this time. 115/72, HR 53, O2 100% on 2L. Rapid COVID swab done and delivered to lab.  Patient transferred to cath lab with 2 RNs, pads and monitor on. Bedside handoff in cath lab. Dr. Fletcher Anon waiting in cath lab and spoke with patient.

## 2019-03-18 NOTE — Progress Notes (Signed)
1330 Patient received from the Cath lab post PCI. TR band intact with 13cc of air in band.

## 2019-03-18 NOTE — H&P (Addendum)
[facility] NAME: Jeff Rios    MR#:  [id number]  DATE OF BIRTH:  [birth year]  DATE OF ADMISSION:  03/18/2019  PRIMARY CARE PHYSICIAN: Patient, [name]   REQUESTING/REFERRING PHYSICIAN: Brenton Grills, MD CHIEF COMPLAINT:   Chief Complaint  Patient presents with  . Chest Pain    HISTORY OF PRESENT ILLNESS:  Jeff Rios  is a 53 y.o. African-American male male with a known history of, who presented to the emergency room with acute onset of midsternal chest pain, graded 8-10/10 in severity that started at 1 AM this morning while the patient was getting up and going to the bathroom.  He describes it as chest burning and epigastric tightness with no radiation.  He denies any associated dyspnea or palpitations.  No cough or wheezing or hemoptysis.  No leg pain or edema or recent travels or surgeries.  No bleeding diathesis.  He continues to smoke about 3 to 4 cigarettes/day.  On presentation to the emergency room, blood pressure was 147/96 with otherwise normal vital signs.  Labs revealed borderline potassium of 3.5 and high high-sensitivity troponin I of 59.  For which x-ray showed no acute cardiopulmonary disease.  EKG showed normal sinus rhythm with a rate of 76 with right axis deviation and suspected left atrial enlargement with poor R wave progression and T wave inversion laterally.  There was a question initially about the possibility of STEMI and Dr. Fletcher Anon was notified however he did not think EKG was consistent with STEMI.  Repeat EKG revealed normal sinus rhythm with rate of 67 with similar findings, T wave inversion anterolaterally and inferiorly.  The patient was given IV heparin bolus and infusion as well as sublingual nitroglycerin, a liter bolus of IV normal saline 1 g p.o. Tylenol.  He will be admitted to telemetry bed for further evaluation and management. PAST MEDICAL HISTORY:   Past Medical History:  Diagnosis Date  .  Hypertension     PAST SURGICAL HISTORY:  History reviewed. No pertinent surgical history.  SOCIAL HISTORY:   Social History   Tobacco Use  . Smoking status: Current Every Day Smoker    Packs/day: 1.00    Types: Cigarettes  . Smokeless tobacco: Never Used  Substance Use Topics  . Alcohol use: No    FAMILY HISTORY:  History reviewed. No pertinent family history.  DRUG ALLERGIES:  No Known Allergies  REVIEW OF SYSTEMS:   ROS As per history of present illness. All pertinent systems were reviewed above. Constitutional,  HEENT, cardiovascular, respiratory, GI, GU, musculoskeletal, neuro, psychiatric, endocrine,  integumentary and hematologic systems were reviewed and are otherwise  negative/unremarkable except for positive findings mentioned above in the HPI.   MEDICATIONS AT HOME:   Prior to Admission medications   Medication Sig Start Date End Date Taking? Authorizing Provider  metoprolol tartrate (LOPRESSOR) 25 MG tablet Take 0.5 tablets (12.5 mg total) by mouth 2 (two) times daily. 03/05/19 04/04/19  Vanessa Glen Flora, MD  hydrochlorothiazide (HYDRODIURIL) 12.5 MG tablet Take 1 tablet (12.5 mg total) by mouth daily. 03/05/19 03/05/19  Vanessa McPherson, MD      VITAL SIGNS:  Blood pressure 140/83, pulse 71, temperature 98.1 F (36.7 C), temperature source Oral, resp. rate (!) 25, height 5\' 4"  (1.626 m), weight 72.6 kg, SpO2 96 %.  PHYSICAL EXAMINATION:  Physical Exam  GENERAL:  53 y.o.-year-old African-American male patient lying in the bed with no acute distress.  EYES: Pupils equal,  round, reactive to light and accommodation. No scleral icterus. Extraocular muscles intact.  HEENT: Head atraumatic, normocephalic. Oropharynx and nasopharynx clear.  NECK:  Supple, no jugular venous distention. No thyroid enlargement, no tenderness.  LUNGS: Normal breath sounds bilaterally, no wheezing, rales,rhonchi or crepitation. No use of accessory muscles of respiration.  CARDIOVASCULAR:  Regular rate and rhythm, S1, S2 normal. No murmurs, rubs, or gallops.  ABDOMEN: Soft, nondistended, nontender. Bowel sounds present. No organomegaly or mass.  EXTREMITIES: No pedal edema, cyanosis, or clubbing.  NEUROLOGIC: Cranial nerves II through XII are intact. Muscle strength 5/5 in all extremities. Sensation intact. Gait not checked.  PSYCHIATRIC: The patient is alert and oriented x 3.  Normal affect and good eye contact. SKIN: No obvious rash, lesion, or ulcer.   LABORATORY PANEL:   CBC No results for input(s): WBC, HGB, HCT, PLT in the last 168 hours. ------------------------------------------------------------------------------------------------------------------  Chemistries  Recent Labs  Lab 03/18/19 0451  NA 135  K 3.5  CL 102  CO2 25  GLUCOSE 110*  BUN 16  CREATININE 1.11  CALCIUM 8.7*  AST 20  ALT 21  ALKPHOS 54  BILITOT 0.7   ------------------------------------------------------------------------------------------------------------------  Cardiac Enzymes No results for input(s): TROPONINI in the last 168 hours. ------------------------------------------------------------------------------------------------------------------  RADIOLOGY:  Dg Chest Portable 1 View  Result Date: 03/18/2019 CLINICAL DATA:  Chest pain. Code STEMI. EXAM: PORTABLE CHEST 1 VIEW COMPARISON:  03/05/2019 FINDINGS: The heart size appears within normal limits. There is no pleural effusion or edema. The lungs are clear. Visualized osseous structures are unremarkable. IMPRESSION: No active cardiopulmonary abnormalities. Electronically Signed   By: Signa Kell M.D.   On: 03/18/2019 05:20      IMPRESSION AND PLAN:   1.  Acute coronary syndrome manifested by chest pain with abnormal EKG with ischemic changes and elevated troponin I concerning for likely non-ST elevation MI. -The patient will be admitted to telemetry  bed. -We will follow serial troponin I's. -We will continue IV  heparin drip. -We will place the patient on aspirin as well as statin therapy and obtain fasting lipids. -We will manage pain with as needed IV morphine sulfate and sublingual nitroglycerin. -We will place him on Nitropaste.  We will continue his beta-blocker therapy. -With persistent pain we will place him on IV nitro drip. -We will obtain a 2D echo. -A cardiology consultation will be obtained by Dr. Antoine Poche with [facility].  I notified him regarding the consult.  2.  Hypertension.  We will continue beta-blocker therapy.  3.  Ongoing tobacco abuse.  I counseled him for smoking cessation and he will receive further counseling here.  4.  DVT prophylaxis.  The patient will be on IV heparin.   All the records are reviewed and case discussed with ED provider. The plan of care was discussed in details with the patient (and family). I answered all questions. The patient agreed to proceed with the above mentioned plan. Further management will depend upon hospital course.   CODE STATUS: Full code  TOTAL TIME TAKING CARE OF THIS PATIENT: 50 minutes.    Hannah Beat M.D on 03/18/2019 at 6:21 AM  [facility]   From 7 PM-7 AM, contact night-coverage [email or web address]  CC: Primary care physician; Patient, [name]   Note: This dictation was prepared with Dragon dictation along with smaller phrase technology. Any transcriptional errors that result from this process are unintentional.

## 2019-03-18 NOTE — Progress Notes (Signed)
PROGRESS NOTE    Jeff Rios  EXH:[id number] DOB: 08/27/1965 DOA: 03/18/2019 PCP: Patient, [name]   Brief Narrative:  53 year old African-American male with a known history of hypertension who presented to the ER with chest pain that started approximately 1:00 in the morning.  Patient reported substernal with chest burning and tightness.  He denied radiation dyspnea palpitations.  He did endorse continued tobacco dependence.  In the emergency room patient was found to have a mildly elevated troponin which increased on subsequent troponin.  He also was noted to have ST changes which was reviewed by cardiology and felt not to be an ST elevation MI.  They did recommend heparin.  After admission patient continued chest pain repeat EKG cardiology reviewed did not feel it was an ST elevation MI but because of persistent chest pain in the setting of heparin are taken the patient to the cath lab for catheterization   Assessment & Plan:   Active Problems:   Chest pain   ACS (acute coronary syndrome) (HCC)   Non-ST elevation (NSTEMI) myocardial infarction (HCC)  Non-ST elevation MI: Aspirin, Lipitor, heparin drip Cardiology seen consultation with planned heart catheterization.  Hypertension: Patient was taking metoprolol preadmission will continue post-cath   DVT prophylaxis: FY:[id number] drip  Code Status: Full code    Code Status Orders  (From admission, onward)         Start     Ordered   03/18/19 0615  Full code  Continuous     03/18/19 0619        Code Status History    This patient has a current code status but no historical code status.   Advance Care Planning Activity     Family Communication: Discussed in detail with patient Disposition Plan:   Patient will remain inpatient for heart catheterization, post catheterization care IV anticoagulation with heparin and expert subspecialty consultation Consults called: None Admission status: Inpatient   Consultants:    Cardiology  Procedures:  Dg Chest Portable 1 View  Result Date: 03/18/2019 CLINICAL DATA:  Chest pain. Code STEMI. EXAM: PORTABLE CHEST 1 VIEW COMPARISON:  03/05/2019 FINDINGS: The heart size appears within normal limits. There is no pleural effusion or edema. The lungs are clear. Visualized osseous structures are unremarkable. IMPRESSION: No active cardiopulmonary abnormalities. Electronically Signed   By: Kerby Moors M.D.   On: 03/18/2019 05:20   Dg Chest Portable 1 View  Result Date: 03/05/2019 CLINICAL DATA:  Allergic reaction, tongue swelling, rash, shortness of breath, smoker EXAM: PORTABLE CHEST 1 VIEW COMPARISON:  Portable exam 2220 hours compared to 08/12/2016 FINDINGS: Borderline enlargement of cardiac silhouette. Mediastinal contours and pulmonary vascularity normal. Mild chronic elevation of RIGHT diaphragm with mild RIGHT basilar atelectasis. Remaining lungs clear. No pleural effusion or pneumothorax. IMPRESSION: Mild RIGHT basilar atelectasis. Electronically Signed   By: Lavonia Dana M.D.   On: 03/05/2019 22:28     Antimicrobials:   None   Subjective: Patient with chest pain this morning .EKG obtained Reviewed by cardiology with plans for heart catheterization urgently for persistent chest pain in the setting of heparin drip  Objective: Vitals:   03/18/19 1331 03/18/19 1400 03/18/19 1430 03/18/19 1500  BP:  127/89  112/72  Pulse: 89 79 82 71  Resp: (!) 23 (!) 24 (!) 21 (!) 22  Temp:      TempSrc:      SpO2:  97% 98% 98%  Weight:      Height:       No  intake or output data in the 24 hours ending 03/18/19 1612 Filed Weights   03/18/19 0454 03/18/19 0831  Weight: 72.6 kg 81.3 kg    Examination:  General exam: Appears calm appears somewhat uncomfortable  Respiratory system: Clear to auscultation. Respiratory effort normal. Cardiovascular system: S1 & S2 heard, RRR. No JVD, murmurs, rubs, gallops or clicks. No pedal edema. Gastrointestinal system: Abdomen  is nondistended, soft and nontender. No organomegaly or masses felt. Normal bowel sounds heard. Central nervous system: Alert and oriented. No focal neurological deficits. Extremities: Warm well perfused, no edema. Skin: No rashes, lesions or ulcers Psychiatry: Judgement and insight appear normal. Mood & affect appropriate.     Data Reviewed: I have personally reviewed following labs and imaging studies  CBC: Recent Labs  Lab 03/18/19 0451  WBC 7.5  NEUTROABS 4.5  HGB 15.2  HCT RESULTS UNAVAILABLE DUE TO INTERFERING SUBSTANCE  MCV RESULTS UNAVAILABLE DUE TO INTERFERING SUBSTANCE  PLT 248   Basic Metabolic Panel: Recent Labs  Lab 03/18/19 0451  NA 135  K 3.5  CL 102  CO2 25  GLUCOSE 110*  BUN 16  CREATININE 1.11  CALCIUM 8.7*   GFR: Estimated Creatinine Clearance: 75.7 mL/min (by C-G formula based on SCr of 1.11 mg/dL). Liver Function Tests: Recent Labs  Lab 03/18/19 0451  AST 20  ALT 21  ALKPHOS 54  BILITOT 0.7  PROT 7.0  ALBUMIN 4.0   Recent Labs  Lab 03/18/19 0451  LIPASE 38   No results for input(s): AMMONIA in the last 168 hours. Coagulation Profile: Recent Labs  Lab 03/18/19 0451  INR 1.0   Cardiac Enzymes: No results for input(s): CKTOTAL, CKMB, CKMBINDEX, TROPONINI in the last 168 hours. BNP (last 3 results) No results for input(s): PROBNP in the last 8760 hours. HbA1C: No results for input(s): HGBA1C in the last 72 hours. CBG: No results for input(s): GLUCAP in the last 168 hours. Lipid Profile: Recent Labs    03/18/19 0701  CHOL 164  HDL 42  LDLCALC 116*  TRIG 32  CHOLHDL 3.9   Thyroid Function Tests: No results for input(s): TSH, T4TOTAL, FREET4, T3FREE, THYROIDAB in the last 72 hours. Anemia Panel: No results for input(s): VITAMINB12, FOLATE, FERRITIN, TIBC, IRON, RETICCTPCT in the last 72 hours. Sepsis Labs: No results for input(s): PROCALCITON, LATICACIDVEN in the last 168 hours.  Recent Results (from the past 240  hour(s))  SARS CORONAVIRUS 2 (TAT 6-24 HRS) Nasopharyngeal Nasopharyngeal Swab     Status: None   Collection Time: 03/18/19  4:51 AM   Specimen: Nasopharyngeal Swab  Result Value Ref Range Status   SARS Coronavirus 2 NEGATIVE NEGATIVE Final    Comment: (NOTE) SARS-CoV-2 target nucleic acids are NOT DETECTED. The SARS-CoV-2 RNA is generally detectable in upper and lower respiratory specimens during the acute phase of infection. Negative results do not preclude SARS-CoV-2 infection, do not rule out co-infections with other pathogens, and should not be used as the sole basis for treatment or other patient management decisions. Negative results must be combined with clinical observations, patient history, and epidemiological information. The expected result is Negative. Fact Sheet for Patients: HairSlick.no Fact Sheet for Healthcare Providers: quierodirigir.com This test is not yet approved or cleared by the [place] FDA and  has been authorized for detection and/or diagnosis of SARS-CoV-2 by FDA under an Emergency Use Authorization (EUA). This EUA will remain  in effect (meaning this test can be used) for the duration of the COVID-19 declaration under Section 56  4(b)(1) of the Act, 21 U.S.C. section 360bbb-3(b)(1), unless the authorization is terminated or revoked sooner. Performed at [facility], [address] [place], [place] [address]   SARS Coronavirus 2 by RT PCR (hospital order, performed in [facility] hospital lab) Nasopharyngeal Nasopharyngeal Swab     Status: None   Collection Time: 03/18/19 10:50 AM   Specimen: Nasopharyngeal Swab  Result Value Ref Range Status   SARS Coronavirus 2 NEGATIVE NEGATIVE Final    Comment: (NOTE) If result is NEGATIVE SARS-CoV-2 target nucleic acids are NOT DETECTED. The SARS-CoV-2 RNA is generally detectable in upper and lower  respiratory specimens during the acute phase of  infection. The lowest  concentration of SARS-CoV-2 viral copies this assay can detect is 250  copies / mL. A negative result does not preclude SARS-CoV-2 infection  and should not be used as the sole basis for treatment or other  patient management decisions.  A negative result may occur with  improper specimen collection / handling, submission of specimen other  than nasopharyngeal swab, presence of viral mutation(s) within the  areas targeted by this assay, and inadequate number of viral copies  (<250 copies / mL). A negative result must be combined with clinical  observations, patient history, and epidemiological information. If result is POSITIVE SARS-CoV-2 target nucleic acids are DETECTED. The SARS-CoV-2 RNA is generally detectable in upper and lower  respiratory specimens dur ing the acute phase of infection.  Positive  results are indicative of active infection with SARS-CoV-2.  Clinical  correlation with patient history and other diagnostic information is  necessary to determine patient infection status.  Positive results do  not rule out bacterial infection or co-infection with other viruses. If result is PRESUMPTIVE POSTIVE SARS-CoV-2 nucleic acids MAY BE PRESENT.   A presumptive positive result was obtained on the submitted specimen  and confirmed on repeat testing.  While 2019 novel coronavirus  (SARS-CoV-2) nucleic acids may be present in the submitted sample  additional confirmatory testing may be necessary for epidemiological  and / or clinical management purposes  to differentiate between  SARS-CoV-2 and other Sarbecovirus currently known to infect humans.  If clinically indicated additional testing with an alternate test  methodology [phone] is advised. The SARS-CoV-2 RNA is generally  detectable in upper and lower respiratory sp ecimens during the acute  phase of infection. The expected result is Negative. Fact Sheet for Patients:   BoilerBrush.com.cyhttps://www.fda.gov/media/136312/download Fact Sheet for Healthcare Providers: https://pope.com/https://www.fda.gov/media/136313/download This test is not yet approved or cleared by the [place] FDA and has been authorized for detection and/or diagnosis of SARS-CoV-2 by FDA under an Emergency Use Authorization (EUA).  This EUA will remain in effect (meaning this test can be used) for the duration of the COVID-19 declaration under Section 564(b)(1) of the Act, 21 U.S.C. section 360bbb-3(b)(1), unless the authorization is terminated or revoked sooner. Performed at [facility], [address] [place], [place] [address]          Radiology Studies: Dg Chest Portable 1 View  Result Date: 03/18/2019 CLINICAL DATA:  Chest pain. Code STEMI. EXAM: PORTABLE CHEST 1 VIEW COMPARISON:  03/05/2019 FINDINGS: The heart size appears within normal limits. There is no pleural effusion or edema. The lungs are clear. Visualized osseous structures are unremarkable. IMPRESSION: No active cardiopulmonary abnormalities. Electronically Signed   By: Signa Kellaylor  Stroud M.D.   On: 03/18/2019 05:20        Scheduled Meds: . aspirin EC  81 mg Oral Daily  .  atorvastatin  40 mg Oral q1800  . [START ON 03/19/2019] enoxaparin (LOVENOX) injection  40 mg Subcutaneous Q24H  . [START ON 03/19/2019] prasugrel  10 mg Oral Daily  . sodium chloride flush  3 mL Intravenous Q12H  . sodium chloride flush  3 mL Intravenous Q12H   Continuous Infusions: . sodium chloride 555 mL/hr at 03/18/19 1204  . sodium chloride    . sodium chloride    . [START ON 03/19/2019] sodium chloride     Followed by  . [START ON 03/19/2019] sodium chloride    . sodium chloride 1 mL/kg/hr (03/18/19 1330)     LOS: 0 days    Time spent: 35 min    Burke Keels, MD [facility]  If 7PM-7AM, please contact night-coverage  03/18/2019, 4:12 PM

## 2019-03-18 NOTE — Progress Notes (Signed)
Christell Faith PA notified of elevated repeat troponin

## 2019-03-18 NOTE — Consult Note (Addendum)
Cardiology Consultation:   Patient ID: Jeff Rios; [id number]; 1965-12-21   Admit date: 03/18/2019 Date of Consult: 03/18/2019  Primary Care Provider: Patient, [name] Primary Cardiologist: [facility] - [facility]   Patient Profile:   Jeff Rios is a 53 y.o. male with a hx of HTN and ongoing tobacco abuse who is being seen today for the evaluation of NSTEMI at the request of Dr. Arville CareMansy.  History of Present Illness:   Jeff Rios has no previously known cardiac history.  Patient was recently seen in the ED on 03/05/2019 with idiopathic angioedema and treated with steroids.  There was some concern this may have been related to his amlodipine leading to its discontinuation and initiation of metoprolol.  Following this, patient did note improvement in swelling.  He was woken up this morning around 1 AM with severe substernal chest pain that was described as pressure and squeezing.  Pain was rated 10 out of 10.  There has been some associated nausea, shortness of breath, and diaphoresis.  Patient notes over the past several weeks he has had intermittent exertional chest discomfort though this is the first time it has woken him up from sleep.  He denies any lower extremity swelling, abdominal surgeon, orthopnea, PND, early satiety.  No palpitations, dizziness, presyncope, syncope.  Given severity of pain he presented [facility].  [facility] they were found to have BP 140s systolic upon admission, currently in the 90s systolic, HR 50s to 60s bpm, temp afebrile, oxygen saturation 97% on room air, weight 72.6 kg. EKG showed sinus rhythm with diffuse nonspecific st/t changes as detailed below, CXR showed no acute cardiopulmonary process.  EKG was reviewed by on-call STEMI MD and nondiagnostic for ST elevation MI.  With recommendation to trend troponin and repeat EKG.  Follow-up EKG largely unchanged as outlined below.  Labs showed high sensitivity  troponin 59-->210, Potassium 3.5, BUN/SCr 16/1.11, WBC 7.5, HGB 15.2, PLT 248, LDL 116. He has received SL NTG x 3 along with being placed on a heparin gtt. Currently, he notes 10 out of 10 chest pain. He was given SL NTG x 1 with drop in systolic BP from 829115 to 91 mmHg and continued angina. In this setting he was given IV morphine. Repeat EKG this morning showed sinus bradycardia, 55 bpm, possible left atrial enlargement, baseline wandering along the inferior leads, continued anterior lateral T wave inversion. Repeat high sensitivity troponin 876.  Patient has continued to note 10 out of 10 chest pain since his arrival to the ED despite following a nitroglycerin x4 as well as IV morphine.  Upon initial evaluation this morning, patient noted severe 10 out of 10 chest pain located substernally and described as pressure/squeezing without radiation.  He denies any associated diaphoresis, nausea, vomiting, shortness of breath, or palpitations at this time.  Patient denies any illicit substances.  He denies any family history of premature coronary disease.  Patient's Covid test is pending.  Past Medical History:  Diagnosis Date   Hypertension     History reviewed. No pertinent surgical history.   Home Meds: Prior to Admission medications   Medication Sig Start Date End Date Taking? Authorizing Provider  metoprolol tartrate (LOPRESSOR) 25 MG tablet Take 0.5 tablets (12.5 mg total) by mouth 2 (two) times daily. 03/05/19 04/04/19 Yes Concha SeFunke, Mary E, MD  hydrochlorothiazide (HYDRODIURIL) 12.5 MG tablet Take 1 tablet (12.5 mg total) by mouth daily. 03/05/19 03/05/19  Fuller PlanFunke,  Alben Spittle, MD    Inpatient Medications: Scheduled Meds:  aspirin EC  81 mg Oral Daily   atorvastatin  40 mg Oral q1800   metoprolol tartrate  12.5 mg Oral BID   nitroGLYCERIN  1 inch Topical Q6H   Continuous Infusions:  sodium chloride 75 mL/hr at 03/18/19 0848   heparin 850 Units/hr (03/18/19 0546)   PRN Meds: acetaminophen,  ALPRAZolam, morphine injection, nitroGLYCERIN, ondansetron (ZOFRAN) IV, zolpidem  Allergies:  No Known Allergies  Social History:   Social History   Socioeconomic History   Marital status: Single    Spouse name: Not on file   Number of children: Not on file   Years of education: Not on file   Highest education level: Not on file  Occupational History   Not on file  Social Needs   Financial resource strain: Not on file   Food insecurity    Worry: Not on file    Inability: Not on file   Transportation needs    Medical: Not on file    Non-medical: Not on file  Tobacco Use   Smoking status: Current Every Day Smoker    Packs/day: 1.00    Types: Cigarettes   Smokeless tobacco: Never Used  Substance and Sexual Activity   Alcohol use: No   Drug use: No   Sexual activity: Not on file  Lifestyle   Physical activity    Days per week: Not on file    Minutes per session: Not on file   Stress: Not on file  Relationships   Social connections    Talks on phone: Not on file    Gets together: Not on file    Attends religious service: Not on file    Active member of club or organization: Not on file    Attends meetings of clubs or organizations: Not on file    Relationship status: Not on file   Intimate partner violence    Fear of current or ex partner: Not on file    Emotionally abused: Not on file    Physically abused: Not on file    Forced sexual activity: Not on file  Other Topics Concern   Not on file  Social History Narrative   Not on file     Family History:  History reviewed. No pertinent family history.   Patient unaware of any family history  ROS:  Review of Systems  Constitutional: Positive for diaphoresis and malaise/fatigue. Negative for chills, fever and weight loss.  HENT: Negative for congestion.   Eyes: Negative for discharge and redness.  Respiratory: Positive for shortness of breath. Negative for cough, hemoptysis, sputum production  and wheezing.   Cardiovascular: Positive for chest pain. Negative for palpitations, orthopnea, claudication, leg swelling and PND.  Gastrointestinal: Positive for nausea. Negative for abdominal pain, blood in stool, heartburn, melena and vomiting.  Genitourinary: Negative for hematuria.  Musculoskeletal: Negative for falls and myalgias.  Skin: Negative for rash.  Neurological: Positive for weakness. Negative for dizziness, tingling, tremors, sensory change, speech change, focal weakness and loss of consciousness.  Endo/Heme/Allergies: Does not bruise/bleed easily.  Psychiatric/Behavioral: Negative for substance abuse. The patient is not nervous/anxious.   All other systems reviewed and are negative.     Physical Exam/Data:   Vitals:   03/18/19 0630 03/18/19 0730 03/18/19 0831 03/18/19 0958  BP: 130/83 128/82 129/83 115/80  Pulse: 65 64 (!) 56 (!) 53  Resp: 14 (!) 21 16   Temp:   97.8 F (36.6  C)   TempSrc:   Oral   SpO2: 99% 97% 100%   Weight:      Height:       No intake or output data in the 24 hours ending 03/18/19 1003 Filed Weights   03/18/19 0454  Weight: 72.6 kg   Body mass index is 27.46 kg/m.   Physical Exam: General: Toxic appearing. Head: Normocephalic, atraumatic, sclera non-icteric, no xanthomas, nares without discharge.  Neck: Negative for carotid bruits. JVD not elevated. Lungs: Clear bilaterally to auscultation without wheezes, rales, or rhonchi. Breathing is unlabored. Heart: RRR with S1 S2. No murmurs, rubs, or gallops appreciated.  Pain is not reproducible to palpation. Abdomen: Soft, non-tender, non-distended with normoactive bowel sounds. No hepatomegaly. No rebound/guarding. No obvious abdominal masses. Msk:  Strength and tone appear normal for age. Extremities: No clubbing or cyanosis. No edema. Distal pedal pulses are 2+ and equal bilaterally. Neuro: Alert and oriented X 3. No facial asymmetry. No focal deficit. Moves all extremities  spontaneously. Psych:  Responds to questions appropriately with a normal affect.   EKG:  The EKG was personally reviewed and demonstrates: 11/14, 4: 48 - NSR, 76 bpm, RAD, baseline wandering, LVH with early repolarization abnormality, lateral TWI. 11/14, 5:43 - NSR, 67 bpm, possible prior septal infarct, diffuse nonspecific st/t changes.  11/14, 1030 - sinus bradycardia, 55 bpm, possible left atrial enlargement, baseline wandering along the inferior leads, anterolateral T wave inversion Telemetry:  Telemetry was personally reviewed and demonstrates: SR to sinus bradycardia, 50s to 60s bpm, rare PVC, TWI  Weights: Filed Weights   03/18/19 0454  Weight: 72.6 kg    Relevant CV Studies: 2D echo pending  Laboratory Data:  Chemistry Recent Labs  Lab 03/18/19 0451  NA 135  K 3.5  CL 102  CO2 25  GLUCOSE 110*  BUN 16  CREATININE 1.11  CALCIUM 8.7*  GFRNONAA >60  GFRAA >60  ANIONGAP 8    Recent Labs  Lab 03/18/19 0451  PROT 7.0  ALBUMIN 4.0  AST 20  ALT 21  ALKPHOS 54  BILITOT 0.7   Hematology Recent Labs  Lab 03/18/19 0451  WBC 7.5  RBC RESULTS UNAVAILABLE DUE TO INTERFERING SUBSTANCE  HGB 15.2  HCT RESULTS UNAVAILABLE DUE TO INTERFERING SUBSTANCE  MCV RESULTS UNAVAILABLE DUE TO INTERFERING SUBSTANCE  [facility] RESULTS UNAVAILABLE DUE TO INTERFERING SUBSTANCE  [facility] RESULTS UNAVAILABLE DUE TO INTERFERING SUBSTANCE  RDW RESULTS UNAVAILABLE DUE TO INTERFERING SUBSTANCE  PLT 248   Cardiac EnzymesNo results for input(s): TROPONINI in the last 168 hours. No results for input(s): TROPIPOC in the last 168 hours.  BNPNo results for input(s): BNP, PROBNP in the last 168 hours.  DDimer No results for input(s): DDIMER in the last 168 hours.  Radiology/Studies:  Dg Chest Portable 1 View  Result Date: 03/18/2019 IMPRESSION: No active cardiopulmonary abnormalities. Electronically Signed   By: Kerby Moors M.D.   On: 03/18/2019 05:20    Assessment and Plan:   1.  NSTEMI: -Ongoing, 10 out of 10 chest pain since arrival to the ED -High-sensitivity troponin has trended to 876 this morning -EKG nondiagnostic for ST elevation MI though with continued anterolateral T wave inversion -Case discussed with on-call STEMI MD with recommendation to activate cath lab for urgent LHC this morning -[facility] notified -Will attempt to get patient swabbed with rapid test for Covid given emergent procedure -Give ASA 324 mg x 1 -Hold off on loading dose of Brilinta after discussion with interventional cardiology -Place Zoll monitor on  patient -Sublingual nitroglycerin as needed -IV morphine as needed -Supplemental oxygen -Obtain echo -Hypotension precludes addition of beta-blocker or ACE inhibitor at this time, escalate as able -Lipitor -Risks and benefits of cardiac catheterization have been discussed with the patient including risks of bleeding, bruising, infection, kidney damage, stroke, heart attack, urgent need for bypass, injury to a limb and death. The patient understands these risks and is willing to proceed with the procedure. All questions have been answered and concerns listened to  2.  HTN: -Blood pressure soft in the setting of #1 -Metoprolol and HCTZ on hold -Resume as able  3.  HLD: -LDL of 116 with likely goal being less than 70 following LHC -Lipitor as above  4.  Hyperglycemia: -Check A1c for further risk stratification  5.  Tobacco abuse: -Complete cessation is recommended   For questions or updates, please contact [facility] Please consult [email or web address] for contact info under Cardiology/STEMI.   Signed, Eula Listen, PA-C [facility] Pager: [phone] 03/18/2019, 10:03 AM

## 2019-03-18 NOTE — ED Provider Notes (Signed)
[facility] Emergency Department Provider Note  ____________________________________________  Time seen: Approximately 5:49 AM  I have reviewed the triage vital signs and the nursing notes.   HISTORY  Chief Complaint Chest Pain    HPI Jeff Rios is a 53 y.o. male with a history of hypertension who comes the ED complaining of chest pain that started at 1:00 AM this morning while he was getting up to go to the bathroom.  Pain is in the center of the chest, described as tightness and burning, nonradiating, no aggravating or alleviating factors.  No shortness of breath diaphoresis or vomiting.  Patient notes that he has been having similar pain for the past week that is typically worse with exertion and better with rest.  Overall has been getting worse over the week.   EMS was concerned that the patient's initial EKG showed a STEMI, the activated code STEMI.  However, Dr. Kirke Corin of cardiology called to the ED and advised at 4:40 AM that the EKG does not meet STEMI criteria and Cath Lab did not need to be activated unless other concerning findings were discovered.   Past Medical History:  Diagnosis Date  . Hypertension      There are no active problems to display for this patient.    History reviewed. No pertinent surgical history.   Prior to Admission medications   Medication Sig Start Date End Date Taking? Authorizing Provider  doxycycline (VIBRAMYCIN) 50 MG capsule Take 2 capsules (100 mg total) by mouth 2 (two) times daily. 08/12/16   Irean Hong, MD  hydrocortisone (ANUSOL-HC) 25 MG suppository Place 1 suppository (25 mg total) rectally every 12 (twelve) hours. 10/15/18 10/15/19  Orvil Feil, PA-C  metoprolol tartrate (LOPRESSOR) 25 MG tablet Take 0.5 tablets (12.5 mg total) by mouth 2 (two) times daily. 03/05/19 04/04/19  Concha Se, MD  hydrochlorothiazide (HYDRODIURIL) 12.5 MG tablet Take 1 tablet (12.5 mg total) by mouth daily. 03/05/19  03/05/19  Concha Se, MD     Allergies Patient has no known allergies.   History reviewed. No pertinent family history.  Social History Social History   Tobacco Use  . Smoking status: Current Every Day Smoker    Packs/day: 1.00    Types: Cigarettes  . Smokeless tobacco: Never Used  Substance Use Topics  . Alcohol use: No  . Drug use: No    Review of Systems  Constitutional:   No fever or chills.  ENT:   No sore throat. No rhinorrhea. Cardiovascular: Positive as above chest pain without syncope. Respiratory:   No dyspnea or cough. Gastrointestinal:   Negative for abdominal pain, vomiting and diarrhea.  Musculoskeletal:   Negative for focal pain or swelling All other systems reviewed and are negative except as documented above in ROS and HPI.  ____________________________________________   PHYSICAL EXAM:  VITAL SIGNS: ED Triage Vitals  Enc Vitals Group     BP 03/18/19 0453 (!) 147/96     Pulse Rate 03/18/19 0453 70     Resp 03/18/19 0453 20     Temp 03/18/19 0453 98.1 F (36.7 C)     Temp Source 03/18/19 0453 Oral     SpO2 03/18/19 0453 97 %     Weight 03/18/19 0454 160 lb (72.6 kg)     Height 03/18/19 0454 5\' 4"  (1.626 m)     Head Circumference --      Peak Flow --      Pain Score 03/18/19 0453 10  Pain Loc --      Pain Edu? --      Excl. in [place]? --     Vital signs reviewed, nursing assessments reviewed.   Constitutional:   Alert and oriented. Non-toxic appearance. Eyes:   Conjunctivae are normal. EOMI. PERRL. ENT      Head:   Normocephalic and atraumatic.      Nose:   Wearing a mask.      Mouth/Throat:   Wearing a mask.      Neck:   No meningismus. Full ROM. Hematological/Lymphatic/Immunilogical:   No cervical lymphadenopathy. Cardiovascular:   RRR. Symmetric bilateral radial and DP pulses.  No murmurs. Cap refill less than 2 seconds. Respiratory:   Normal respiratory effort without tachypnea/retractions. Breath sounds are clear and equal  bilaterally. No wheezes/rales/rhonchi. Gastrointestinal:   Soft and nontender. Non distended. There is no CVA tenderness.  No rebound, rigidity, or guarding.  Musculoskeletal:   Normal range of motion in all extremities. No joint effusions.  No lower extremity tenderness.  No edema.  Chest wall nontender Neurologic:   Normal speech and language.  Motor grossly intact. No acute focal neurologic deficits are appreciated.  Skin:    Skin is warm, dry and intact. No rash noted.  No petechiae, purpura, or bullae.  ____________________________________________    LABS (pertinent positives/negatives) (all labs ordered are listed, but only abnormal results are displayed) Labs Reviewed  COMPREHENSIVE METABOLIC PANEL - Abnormal; Notable for the following components:      Result Value   Glucose, Bld 110 (*)    Calcium 8.7 (*)    All other components within normal limits  TROPONIN I (HIGH SENSITIVITY) - Abnormal; Notable for the following components:   Troponin I (High Sensitivity) 59 (*)    All other components within normal limits  SARS CORONAVIRUS 2 (TAT 6-24 HRS)  ETHANOL  LIPASE, BLOOD  PROTIME-INR  APTT  CBC WITH DIFFERENTIAL/PLATELET  HEPARIN LEVEL (UNFRACTIONATED)   ____________________________________________   EKG  Interpreted by me Sinus rhythm rate of 76, right axis, normal intervals.  Poor R wave progression.  Normal ST segments.  T wave inversions in V5 and V6.  Repeat EKG interpreted by me, shows sinus rhythm rate of 67.  Right axis, poor R wave progression.  No evolving ischemic changes.  Persistent T wave inversions in the lateral leads.  ____________________________________________    [id number]  Dg Chest Portable 1 View  Result Date: 03/18/2019 CLINICAL DATA:  Chest pain. Code STEMI. EXAM: PORTABLE CHEST 1 VIEW COMPARISON:  03/05/2019 FINDINGS: The heart size appears within normal limits. There is no pleural effusion or edema. The lungs are clear. Visualized  osseous structures are unremarkable. IMPRESSION: No active cardiopulmonary abnormalities. Electronically Signed   By: Kerby Moors M.D.   On: 03/18/2019 05:20    ____________________________________________   PROCEDURES .Critical Care Performed by: Carrie Mew, MD Authorized by: Carrie Mew, MD   Critical care provider statement:    Critical care time (minutes):  35   Critical care time was exclusive of:  Separately billable procedures and treating other patients   Critical care was necessary to treat or prevent imminent or life-threatening deterioration of the following conditions:  Cardiac failure   Critical care was time spent personally by me on the following activities:  Development of treatment plan with patient or surrogate, discussions with consultants, evaluation of patient's response to treatment, examination of patient, obtaining history from patient or surrogate, ordering and performing treatments and interventions, ordering and review of laboratory  studies, ordering and review of radiographic studies, pulse oximetry, re-evaluation of patient's condition and review of old charts    ____________________________________________  DIFFERENTIAL DIAGNOSIS   Non-STEMI, unstable angina, pneumonia, pleural effusion, pleurisy, GERD  CLINICAL IMPRESSION / ASSESSMENT AND PLAN / ED COURSE  Medications ordered in the ED: Medications  nitroGLYCERIN (NITROSTAT) SL tablet 0.4 mg (0.4 mg Sublingual Given 03/18/19 0548)  heparin bolus via infusion 4,000 Units (4,000 Units Intravenous Bolus from Bag 03/18/19 0547)    Followed by  heparin ADULT infusion 100 units/mL (25000 units/25150mL sodium chloride 0.45%) (850 Units/hr Intravenous New Bag/Given 03/18/19 0546)  sodium chloride 0.9 % bolus 1,000 mL (1,000 mLs Intravenous New Bag/Given 03/18/19 0504)  acetaminophen (TYLENOL) tablet 1,000 mg (1,000 mg Oral Given 03/18/19 0502)    Pertinent labs & imaging results that were  available during my care of the patient were reviewed by me and considered in my medical decision making (see chart for details).  Jeff Rios was evaluated in Emergency Department on 03/18/2019 for the symptoms described in the history of present illness. He was evaluated in the context of the global COVID-19 pandemic, which necessitated consideration that the patient might be at risk for infection with the SARS-CoV-2 virus that causes COVID-19. Institutional protocols and algorithms that pertain to the evaluation of patients at risk for COVID-19 are in a state of rapid change based on information released by regulatory bodies including the CDC and federal and state organizations. These policies and algorithms were followed during the patient's care in the ED.   Patient presents with acute chest pain onset this morning at 1:00 AM.  EKG nondiagnostic.  Concerning for unstable angina with accelerating symptoms.  Will start heparin bolus and drip while waiting for labs.  Clinical Course as of Mar 17 552  Sat Mar 18, 2019  [id number] Initial troponin 59.  Heparin bolus and infusion already ordered for symptoms of unstable angina.   [PS]    Clinical Course User Index [PS] Sharman CheekStafford, Davari Lopes, MD     ____________________________________________   FINAL CLINICAL IMPRESSION(S) / ED DIAGNOSES    Final diagnoses:  Precordial pain  Unstable angina [facility](HCC)     ED Discharge Orders    None      Portions of this note were generated with dragon dictation software. Dictation errors may occur despite best attempts at proofreading.   Sharman CheekStafford, Elizebath Wever, MD 03/18/19 [phone]

## 2019-03-18 NOTE — ED Notes (Signed)
Report given to Taylor, RN

## 2019-03-18 NOTE — Progress Notes (Signed)
Patient admitted to unit. Oriented to room, call bell, and staff. Bed in lowest position. Fall safety plan reviewed. Full assessment to Epic. Skin assessment verified with Stacie Glaze. Telemetry box verification with tele clerk- Box#: --[id number]---. Will continue to monitor.  Patient resting at this time. Reports pain is manageable, maybe 3/10, but states it has been going up with any activity. Encouraged him to rest, reminded that he had orders for NPO and bedrest. Call bell and phone in reach.  **Home meds sent to pharmacy**

## 2019-03-18 NOTE — ED Triage Notes (Signed)
Patient presents from home with chest pain, code STEMI called en route however cardiologist cancelled on arrival. Patient is complaining of burning pain in mid chest area without radiation. VSS

## 2019-03-18 NOTE — Progress Notes (Signed)
ANTICOAGULATION CONSULT NOTE - Initial Consult  Pharmacy Consult for Heparin Indication: chest pain/ACS  No Known Allergies  Patient Measurements: Height: 5\' 4"  (162.6 cm) Weight: 160 lb (72.6 kg) IBW/kg (Calculated) : 59.2 HEPARIN DW (KG): 72.6  Vital Signs: Temp: 98.1 F (36.7 C) (11/14 0453) Temp Source: Oral (11/14 0453) BP: 144/88 (11/14 0500) Pulse Rate: 67 (11/14 0500)  Labs: No results for input(s): HGB, HCT, PLT, APTT, LABPROT, INR, HEPARINUNFRC, HEPRLOWMOCWT, CREATININE, CKTOTAL, CKMB, TROPONINIHS in the last 72 hours.  CrCl cannot be calculated (Patient's most recent lab result is older than the maximum 21 days allowed.).   Medical History: Past Medical History:  Diagnosis Date  . Hypertension    Medications:  (Not in a hospital admission)  Assessment: Patient presents from home with chest pain, code STEMI called en route however cardiologist cancelled on arrival. Patient is complaining of burning pain in mid chest area without radiation.  Pharmacy asked to initiate and monitor Heparin per protocol.  Baseline labs ordered.   Goal of Therapy:  Heparin level 0.3-0.7 units/ml Monitor platelets by anticoagulation protocol: Yes   Plan:  Heparin 4000 units bolus x 1 then infusion at 850 units/hr Check HL 8 hours after infusion started  Hart Robinsons A 03/18/2019,5:10 AM

## 2019-03-18 NOTE — ED Notes (Signed)
Attempt to call report unsuccessful.

## 2019-03-18 NOTE — Progress Notes (Signed)
*  PRELIMINARY RESULTS* Echocardiogram 2D Echocardiogram has been performed.  Jeff Rios 03/18/2019, 3:34 PM

## 2019-03-19 DIAGNOSIS — I10 Essential (primary) hypertension: Secondary | ICD-10-CM | POA: Diagnosis present

## 2019-03-19 DIAGNOSIS — E785 Hyperlipidemia, unspecified: Secondary | ICD-10-CM | POA: Diagnosis present

## 2019-03-19 LAB — CBC
HCT: 38.1 % — ABNORMAL LOW (ref 39.0–52.0)
Hemoglobin: 13.8 g/dL (ref 13.0–17.0)
MCH: 29.4 pg (ref 26.0–34.0)
MCHC: 36.2 g/dL — ABNORMAL HIGH (ref 30.0–36.0)
MCV: 81.1 fL (ref 80.0–100.0)
Platelets: 217 10*3/uL (ref 150–400)
RBC: 4.7 MIL/uL (ref 4.22–5.81)
RDW: 13.3 % (ref 11.5–15.5)
WBC: 6.3 10*3/uL (ref 4.0–10.5)
nRBC: 0 % (ref 0.0–0.2)

## 2019-03-19 LAB — BASIC METABOLIC PANEL
Anion gap: 5 (ref 5–15)
BUN: 10 mg/dL (ref 6–20)
CO2: 24 mmol/L (ref 22–32)
Calcium: 8 mg/dL — ABNORMAL LOW (ref 8.9–10.3)
Chloride: 108 mmol/L (ref 98–111)
Creatinine, Ser: 1.13 mg/dL (ref 0.61–1.24)
GFR calc Af Amer: >60 mL/min (ref >60)
GFR calc non Af Amer: >60 mL/min (ref >60)
Glucose, Bld: 126 mg/dL — ABNORMAL HIGH (ref 70–99)
Potassium: 4.1 mmol/L (ref 3.5–5.1)
Sodium: 137 mmol/L (ref 135–145)

## 2019-03-19 MED ORDER — POTASSIUM CHLORIDE CRYS ER 20 MEQ PO TBCR
10.0000 meq | EXTENDED_RELEASE_TABLET | Freq: Once | ORAL | Status: AC
  Administered 2019-03-19: 10 meq via ORAL
  Filled 2019-03-19: qty 1

## 2019-03-19 MED ORDER — CHLORHEXIDINE GLUCONATE CLOTH 2 % EX PADS
6.0000 | MEDICATED_PAD | Freq: Every day | CUTANEOUS | Status: DC
  Administered 2019-03-19: 6 via TOPICAL

## 2019-03-19 MED ORDER — FUROSEMIDE 10 MG/ML IJ SOLN
20.0000 mg | Freq: Once | INTRAMUSCULAR | Status: AC
  Administered 2019-03-19: 20 mg via INTRAVENOUS
  Filled 2019-03-19: qty 2

## 2019-03-19 NOTE — Progress Notes (Addendum)
Progress Note  Patient Name: Jeff Rios Date of Encounter: 03/19/2019  Primary Cardiologist: New to [facility] - consult by Arida  Subjective   No further chest pain. No SOB, palpitations, dizziness, presyncope, or syncope. Tolerating DAPT without issues. No issues from right radial cath site. BP improving.   Inpatient Medications    Scheduled Meds: . aspirin EC  81 mg Oral Daily  . atorvastatin  40 mg Oral q1800  . Chlorhexidine Gluconate Cloth  6 each Topical Daily  . enoxaparin (LOVENOX) injection  40 mg Subcutaneous Q24H  . prasugrel  10 mg Oral Daily  . sodium chloride flush  3 mL Intravenous Q12H   Continuous Infusions:  PRN Meds: acetaminophen, ALPRAZolam, morphine injection, nitroGLYCERIN, ondansetron (ZOFRAN) IV, zolpidem   Vital Signs    Vitals:   03/19/19 0300 03/19/19 0400 03/19/19 0500 03/19/19 0600  BP: 119/90 129/75 124/90 111/85  Pulse: (!) 51 (!) 52 95 (!) 55  Resp: (!) 25 18 16  (!) 28  Temp:      TempSrc:      SpO2: 99% 98% 98% 100%  Weight:      Height:        Intake/Output Summary (Last 24 hours) at 03/19/2019 0754 Last data filed at 03/19/2019 0000 Gross per 24 hour  Intake 1109.87 ml  Output -  Net 1109.87 ml   Filed Weights   03/18/19 0454 03/18/19 0831 03/18/19 1330  Weight: 72.6 kg 81.3 kg 85.5 kg    Telemetry    SR - Personally Reviewed  ECG    No new tracings - Personally Reviewed  Physical Exam   GEN: No acute distress.   Neck: No JVD. Cardiac: RRR, no murmurs, rubs, or gallops. Right radial cardiac cath site is well healing without bleeding, bruising, swelling, warmth, erythema, or TTP. Radial pulse 2+.  Respiratory: Clear to auscultation bilaterally.  GI: Soft, nontender, non-distended.   MS: No edema; No deformity. Neuro:  Alert and oriented x 3; Nonfocal.  Psych: Normal affect.  Labs    Chemistry Recent Labs  Lab 03/18/19 0451 03/19/19 0614  NA 135 137  K 3.5 4.1  CL 102 108  CO2 25 24  GLUCOSE  110* 126*  BUN 16 10  CREATININE 1.11 1.13  CALCIUM 8.7* 8.0*  PROT 7.0  --   ALBUMIN 4.0  --   AST 20  --   ALT 21  --   ALKPHOS 54  --   BILITOT 0.7  --   GFRNONAA >60 >60  GFRAA >60 >60  ANIONGAP 8 5     Hematology Recent Labs  Lab 03/18/19 0451 03/19/19 0614  WBC 7.5 6.3  RBC RESULTS UNAVAILABLE DUE TO INTERFERING SUBSTANCE 4.70  HGB 15.2 13.8  HCT RESULTS UNAVAILABLE DUE TO INTERFERING SUBSTANCE 38.1*  MCV RESULTS UNAVAILABLE DUE TO INTERFERING SUBSTANCE 81.1  MCH RESULTS UNAVAILABLE DUE TO INTERFERING SUBSTANCE 29.4  MCHC RESULTS UNAVAILABLE DUE TO INTERFERING SUBSTANCE 36.2*  RDW RESULTS UNAVAILABLE DUE TO INTERFERING SUBSTANCE 13.3  PLT 248 217    Cardiac EnzymesNo results for input(s): TROPONINI in the last 168 hours. No results for input(s): TROPIPOC in the last 168 hours.   BNPNo results for input(s): BNP, PROBNP in the last 168 hours.   DDimer No results for input(s): DDIMER in the last 168 hours.   Radiology    Dg Chest Portable 1 View  Result Date: 03/18/2019 IMPRESSION: No active cardiopulmonary abnormalities. Electronically Signed   By: Queen Slough.D.  On: 03/18/2019 05:20    Cardiac Studies   LHC 03/18/2019:  The left ventricular systolic function is normal.  LV end diastolic pressure is normal.  The left ventricular ejection fraction is 55-65% by visual estimate.  Mid LAD lesion is 80% stenosed.  Prox RCA lesion is 30% stenosed.  Dist RCA lesion is 100% stenosed.  Post intervention, there is a 0% residual stenosis.  A drug-eluting stent was successfully placed using a STENT RESOLUTE ONYX [id number].  RPDA lesion is 40% stenosed.   1.  Significant two-vessel coronary artery disease.  The culprit is an occluded distal right coronary artery with faint left-to-right collaterals.  The supplied area is not large and that is the likely reason for nondiagnostic EKG changes for inferior STEMI.  There is also significant mid LAD stenosis  which appears to be hazy. 2.  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  Significantly elevated LVEDP in the low 30s. 3.  Successful angioplasty and drug-eluting stent placement to the distal RCA.  Recommendations: Continue dual antiplatelet therapy for at least 1 year.  Aggressive treatment of risk factors. Recommend staged PCI of the LAD before hospital discharge.  This is planned for Monday morning. __________  2D echo 03/18/2019: 1. Left ventricular ejection fraction, by visual estimation, is 60 to 65%. The left ventricle has normal function. There is mildly increased left ventricular hypertrophy.  2. Left ventricular diastolic parameters are consistent with Grade I diastolic dysfunction (impaired relaxation).  3. Global right ventricle has normal systolic function.The right ventricular size is normal. No increase in right ventricular wall thickness.  4. The mitral valve is normal in structure. No evidence of mitral valve regurgitation. No evidence of mitral stenosis.  5. The tricuspid valve is normal in structure. Tricuspid valve regurgitation is not demonstrated.  6. The aortic valve is normal in structure. Aortic valve regurgitation is not visualized. No evidence of aortic valve sclerosis or stenosis.  7. The pulmonic valve was normal in structure. Pulmonic valve regurgitation is trivial.  8. TR signal is inadequate for assessing pulmonary artery systolic pressure.  9. The inferior vena cava is normal in size with greater than 50% respiratory variability, suggesting right atrial pressure of 3 mmHg.  Patient Profile     53 y.o. male with history of HTN and ongoing tobacco abuse who is being seen today for the evaluation of NSTEMI.  Assessment & Plan    1. NSTEMI/CAD involving the native coronary arteries: -Admitted with NSTEMI on 11/14 -Underwent urgent LHC morning  of 11/14 secondary to ongoing 10/10 angina as detailed above -Status post PCI/DEs to the RCA with residual disease in the LAD with plans for staged PCI 03/20/2019 -DAPT with ASA and Effient for the next 12 months without interruption -Aggressive risk factor modification  -NPO at midnight -Lipitor as below -Post cath instructions discussed in detail  -Cardiac rehab  -Risks and benefits of cardiac catheterization have been discussed with the patient including risks of bleeding, bruising, infection, kidney damage, stroke, heart attack, urgent need for bypass, injury to a limb, and death. The patient understands these risks and is willing to proceed with the procedure. All questions have been answered and concerns listened to  2. Possible HCM: -Spade-like appearance of the LV on LV gram with a mid cavity gradient suggestive of HCM -Significantly elevated LVEDP in the low 30s mmHg -Gentle diuresis as allowed by BP -Plan for cMRI as  an outpatient, further recommendations pending  3. HTN: -Blood pressure is higher and improving into the low 100s to 120s systolic -Metoprolol currently on hold secondary to bradycardic rates, restart when/if able  4. HLD: -LDL of 116 this admission with a goal of < 70 -Started on Lipitor 40 mg this admission -Will need follow up fasting labs as an outpatient in ~ 8 weeks, if LDL remains above goal at that time escalate Lipitor to 80 mg daily  5. Hyperglycemia: -A1c 5.4 this admission  6. Tobacco abuse: -Cessation is advised  -Declines nicotine patches while admitted at this time, though would like them at discharge    For questions or updates, please contact [facility] Please consult [email or web address] for contact info under Cardiology/STEMI.    Signed, Eula Listen, PA-C [facility] Pager: [phone] 03/19/2019, 7:54 AM  I have seen and examined this patient with Eula Listen.  Agree with above, note added to reflect my findings.  On exam, RRR, no  murmurs, lungs clear.  No current chest pain.  Respiratory status is stable.  He feels well this morning.  We will plan for repeat heart catheterization tomorrow with possible stenting of the LAD.  Will M. Camnitz MD 03/19/2019 12:56 PM

## 2019-03-19 NOTE — Progress Notes (Signed)
PROGRESS NOTE    Jeff Rios  PPI:[id number] DOB: May 02, 1966 DOElmyra Rios: 03/18/2019 PCP: Patient, [name]   Brief Narrative:  53 year old African-American male with a known history of hypertension who presented to the ER with chest pain that started approximately 1:00 in the morning.  Patient reported substernal with chest burning and tightness.  He denied radiation dyspnea palpitations.  He did endorse continued tobacco dependence.  In the emergency room patient was found to have a mildly elevated troponin which increased on subsequent troponin.  He also was noted to have ST changes which was reviewed by cardiology and felt not to be an ST elevation MI.  They did recommend heparin.  After admission patient continued chest pain repeat EKG cardiology reviewed did not feel it was an ST elevation MI but because of persistent chest pain in the setting of heparin are taken the patient to the cath lab for catheterization   Assessment & Plan:   Active Problems:   Chest pain   ACS (acute coronary syndrome) (HCC)   Non-ST elevation (NSTEMI) myocardial infarction [facility](HCC)   Non-ST elevation MI: Status post left heart cath 1114 with PCI/DES to the RCA Planned staged cath on Monday for residual disease in LAD Continue dual antiplatelet therapy with aspirin and Effient for 12 months Patient is n.p.o. after midnight Cont Lipitor  Hypertension: Patient was taking metoprolol preadmission Holding post-cath secondary bradycardia titrated as possible  Hyperlipidemia Continue Lipitor LDL 116 goal is less than 70  Hyperglycemia hemoglobin A1c controlled at 5.4  DVT prophylaxis: Lovenox SQ  Code Status: full code    Code Status Orders  (From admission, onward)         Start     Ordered   03/18/19 0615  Full code  Continuous     03/18/19 0619        Code Status History    This patient has a current code status but no historical code status.   Advance Care Planning Activity     Family  Communication: Discussed in detail with patient Disposition Plan:   Remain inpatient with plans heart catheterization tomorrow.  Patient medically not stable for discharge Consults called: None Admission status: Inpatient   Consultants:   Cardiology  Procedures:  Dg Chest Portable 1 View  Result Date: 03/18/2019 CLINICAL DATA:  Chest pain. Code STEMI. EXAM: PORTABLE CHEST 1 VIEW COMPARISON:  03/05/2019 FINDINGS: The heart size appears within normal limits. There is no pleural effusion or edema. The lungs are clear. Visualized osseous structures are unremarkable. IMPRESSION: No active cardiopulmonary abnormalities. Electronically Signed   By: Jeff Rios  Rios M.D.   On: 03/18/2019 05:20   Dg Chest Portable 1 View  Result Date: 03/05/2019 CLINICAL DATA:  Allergic reaction, tongue swelling, rash, shortness of breath, smoker EXAM: PORTABLE CHEST 1 VIEW COMPARISON:  Portable exam 2220 hours compared to 08/12/2016 FINDINGS: Borderline enlargement of cardiac silhouette. Mediastinal contours and pulmonary vascularity normal. Mild chronic elevation of RIGHT diaphragm with mild RIGHT basilar atelectasis. Remaining lungs clear. No pleural effusion or pneumothorax. IMPRESSION: Mild RIGHT basilar atelectasis. Electronically Signed   By: Jeff SouthwardMark  Rios M.D.   On: 03/05/2019 22:28     Antimicrobials:   None   Subjective: Patient reports no chest pain this morning Ambulating nicely without complication  Objective: Vitals:   03/19/19 1000 03/19/19 1100 03/19/19 1200 03/19/19 1303  BP: 107/76   113/62  Pulse: 66   71  Resp: (!) 27 (!) 22 (!) 23 (!) 29  Temp:  TempSrc:      SpO2: 96%   98%  Weight:      Height:        Intake/Output Summary (Last 24 hours) at 03/19/2019 1552 Last data filed at 03/19/2019 1300 Gross per 24 hour  Intake 1469.87 ml  Output -  Net 1469.87 ml   Filed Weights   03/18/19 0454 03/18/19 0831 03/18/19 1330  Weight: 72.6 kg 81.3 kg 85.5 kg    Examination:   General exam: Appears calm and comfortable  Respiratory system: Clear to auscultation. Respiratory effort normal. Cardiovascular system: Bradycardic moderately, no murmurs noted Gastrointestinal system: Abdomen is nondistended, soft and nontender. No organomegaly or masses felt. Normal bowel sounds heard. Central nervous system: Alert and oriented. No focal neurological deficits. Extremities: Radial puncture site for heart cath stable no evidence of bleeding or swelling, otherwise exam normal warm well perfused. Skin: No rashes, lesions or ulcers Psychiatry: Judgement and insight appear normal. Mood & affect appropriate.     Data Reviewed: I have personally reviewed following labs and imaging studies  CBC: Recent Labs  Lab 03/18/19 0451 03/19/19 0614  WBC 7.5 6.3  NEUTROABS 4.5  --   HGB 15.2 13.8  HCT RESULTS UNAVAILABLE DUE TO INTERFERING SUBSTANCE 38.1*  MCV RESULTS UNAVAILABLE DUE TO INTERFERING SUBSTANCE 81.1  PLT 248 884   Basic Metabolic Panel: Recent Labs  Lab 03/18/19 0451 03/19/19 0614  NA 135 137  K 3.5 4.1  CL 102 108  CO2 25 24  GLUCOSE 110* 126*  BUN 16 10  CREATININE 1.11 1.13  CALCIUM 8.7* 8.0*   GFR: Estimated Creatinine Clearance: 74.7 mL/min (by C-G formula based on SCr of 1.13 mg/dL). Liver Function Tests: Recent Labs  Lab 03/18/19 0451  AST 20  ALT 21  ALKPHOS 54  BILITOT 0.7  PROT 7.0  ALBUMIN 4.0   Recent Labs  Lab 03/18/19 0451  LIPASE 38   No results for input(s): AMMONIA in the last 168 hours. Coagulation Profile: Recent Labs  Lab 03/18/19 0451  INR 1.0   Cardiac Enzymes: No results for input(s): CKTOTAL, CKMB, CKMBINDEX, TROPONINI in the last 168 hours. BNP (last 3 results) No results for input(s): PROBNP in the last 8760 hours. HbA1C: Recent Labs    03/18/19 1330  HGBA1C 5.4   CBG: No results for input(s): GLUCAP in the last 168 hours. Lipid Profile: Recent Labs    03/18/19 0701  CHOL 164  HDL 42  LDLCALC  116*  TRIG 32  CHOLHDL 3.9   Thyroid Function Tests: No results for input(s): TSH, T4TOTAL, FREET4, T3FREE, THYROIDAB in the last 72 hours. Anemia Panel: No results for input(s): VITAMINB12, FOLATE, FERRITIN, TIBC, IRON, RETICCTPCT in the last 72 hours. Sepsis Labs: No results for input(s): PROCALCITON, LATICACIDVEN in the last 168 hours.  Recent Results (from the past 240 hour(s))  SARS CORONAVIRUS 2 (TAT 6-24 HRS) Nasopharyngeal Nasopharyngeal Swab     Status: None   Collection Time: 03/18/19  4:51 AM   Specimen: Nasopharyngeal Swab  Result Value Ref Range Status   SARS Coronavirus 2 NEGATIVE NEGATIVE Final    Comment: (NOTE) SARS-CoV-2 target nucleic acids are NOT DETECTED. The SARS-CoV-2 RNA is generally detectable in upper and lower respiratory specimens during the acute phase of infection. Negative results do not preclude SARS-CoV-2 infection, do not rule out co-infections with other pathogens, and should not be used as the sole basis for treatment or other patient management decisions. Negative results must be combined with clinical observations, patient  history, and epidemiological information. The expected result is Negative. Fact Sheet for Patients: HairSlick.no Fact Sheet for Healthcare Providers: quierodirigir.com This test is not yet approved or cleared by the [place] FDA and  has been authorized for detection and/or diagnosis of SARS-CoV-2 by FDA under an Emergency Use Authorization (EUA). This EUA will remain  in effect (meaning this test can be used) for the duration of the COVID-19 declaration under Section 56 4(b)(1) of the Act, 21 U.S.C. section 360bbb-3(b)(1), unless the authorization is terminated or revoked sooner. Performed at [facility], [address], [place], [place] [address]   SARS Coronavirus 2 by RT PCR (hospital order, performed in [facility] hospital lab) Nasopharyngeal  Nasopharyngeal Swab     Status: None   Collection Time: 03/18/19 10:50 AM   Specimen: Nasopharyngeal Swab  Result Value Ref Range Status   SARS Coronavirus 2 NEGATIVE NEGATIVE Final    Comment: (NOTE) If result is NEGATIVE SARS-CoV-2 target nucleic acids are NOT DETECTED. The SARS-CoV-2 RNA is generally detectable in upper and lower  respiratory specimens during the acute phase of infection. The lowest  concentration of SARS-CoV-2 viral copies this assay can detect is 250  copies / mL. A negative result does not preclude SARS-CoV-2 infection  and should not be used as the sole basis for treatment or other  patient management decisions.  A negative result may occur with  improper specimen collection / handling, submission of specimen other  than nasopharyngeal swab, presence of viral mutation(s) within the  areas targeted by this assay, and inadequate number of viral copies  (<250 copies / mL). A negative result must be combined with clinical  observations, patient history, and epidemiological information. If result is POSITIVE SARS-CoV-2 target nucleic acids are DETECTED. The SARS-CoV-2 RNA is generally detectable in upper and lower  respiratory specimens dur ing the acute phase of infection.  Positive  results are indicative of active infection with SARS-CoV-2.  Clinical  correlation with patient history and other diagnostic information is  necessary to determine patient infection status.  Positive results do  not rule out bacterial infection or co-infection with other viruses. If result is PRESUMPTIVE POSTIVE SARS-CoV-2 nucleic acids MAY BE PRESENT.   A presumptive positive result was obtained on the submitted specimen  and confirmed on repeat testing.  While 2019 novel coronavirus  (SARS-CoV-2) nucleic acids may be present in the submitted sample  additional confirmatory testing may be necessary for epidemiological  and / or clinical management purposes  to differentiate between   SARS-CoV-2 and other Sarbecovirus currently known to infect humans.  If clinically indicated additional testing with an alternate test  methodology [id number]) is advised. The SARS-CoV-2 RNA is generally  detectable in upper and lower respiratory sp ecimens during the acute  phase of infection. The expected result is Negative. Fact Sheet for Patients:  BoilerBrush.com.cy Fact Sheet for Healthcare Providers: https://pope.com/ This test is not yet approved or cleared by the [place] FDA and has been authorized for detection and/or diagnosis of SARS-CoV-2 by FDA under an Emergency Use Authorization (EUA).  This EUA will remain in effect (meaning this test can be used) for the duration of the COVID-19 declaration under Section 564(b)(1) of the Act, 21 U.S.C. section 360bbb-3(b)(1), unless the authorization is terminated or revoked sooner. Performed at [facility], [address], [place], [place] [address]   MRSA PCR Screening     Status: None   Collection Time: 03/18/19  3:38 PM  Specimen: Nasopharyngeal  Result Value Ref Range Status   MRSA by PCR NEGATIVE NEGATIVE Final    Comment:        The GeneXpert MRSA Assay (FDA approved for NASAL specimens only), is one component of a comprehensive MRSA colonization surveillance program. It is not intended to diagnose MRSA infection nor to guide or monitor treatment for MRSA infections. Performed at [facility], [address], [place], [place] [address]          Radiology Studies: Dg Chest Portable 1 View  Result Date: 03/18/2019 CLINICAL DATA:  Chest pain. Code STEMI. EXAM: PORTABLE CHEST 1 VIEW COMPARISON:  03/05/2019 FINDINGS: The heart size appears within normal limits. There is no pleural effusion or edema. The lungs are clear. Visualized osseous structures are unremarkable. IMPRESSION: No active cardiopulmonary abnormalities. Electronically Signed    By: Jeff Kell M.D.   On: 03/18/2019 05:20        Scheduled Meds: . aspirin EC  81 mg Oral Daily  . atorvastatin  40 mg Oral q1800  . Chlorhexidine Gluconate Cloth  6 each Topical Daily  . enoxaparin (LOVENOX) injection  40 mg Subcutaneous Q24H  . prasugrel  10 mg Oral Daily  . sodium chloride flush  3 mL Intravenous Q12H   Continuous Infusions:   LOS: 1 day    Time spent: 35 min    Burke Keels, MD [facility]  If 7PM-7AM, please contact night-coverage  03/19/2019, 3:52 PM

## 2019-03-19 NOTE — H&P (View-Only) (Signed)
Progress Note  Patient Name: Jeff Rios Date of Encounter: 03/19/2019  Primary Cardiologist: New to [facility] - consult by Arida  Subjective   No further chest pain. No SOB, palpitations, dizziness, presyncope, or syncope. Tolerating DAPT without issues. No issues from right radial cath site. BP improving.   Inpatient Medications    Scheduled Meds: . aspirin EC  81 mg Oral Daily  . atorvastatin  40 mg Oral q1800  . Chlorhexidine Gluconate Cloth  6 each Topical Daily  . enoxaparin (LOVENOX) injection  40 mg Subcutaneous Q24H  . prasugrel  10 mg Oral Daily  . sodium chloride flush  3 mL Intravenous Q12H   Continuous Infusions:  PRN Meds: acetaminophen, ALPRAZolam, morphine injection, nitroGLYCERIN, ondansetron (ZOFRAN) IV, zolpidem   Vital Signs    Vitals:   03/19/19 0300 03/19/19 0400 03/19/19 0500 03/19/19 0600  BP: 119/90 129/75 124/90 111/85  Pulse: (!) 51 (!) 52 95 (!) 55  Resp: (!) 25 18 16  (!) 28  Temp:      TempSrc:      SpO2: 99% 98% 98% 100%  Weight:      Height:        Intake/Output Summary (Last 24 hours) at 03/19/2019 0754 Last data filed at 03/19/2019 0000 Gross per 24 hour  Intake 1109.87 ml  Output -  Net 1109.87 ml   Filed Weights   03/18/19 0454 03/18/19 0831 03/18/19 1330  Weight: 72.6 kg 81.3 kg 85.5 kg    Telemetry    SR - Personally Reviewed  ECG    No new tracings - Personally Reviewed  Physical Exam   GEN: No acute distress.   Neck: No JVD. Cardiac: RRR, no murmurs, rubs, or gallops. Right radial cardiac cath site is well healing without bleeding, bruising, swelling, warmth, erythema, or TTP. Radial pulse 2+.  Respiratory: Clear to auscultation bilaterally.  GI: Soft, nontender, non-distended.   MS: No edema; No deformity. Neuro:  Alert and oriented x 3; Nonfocal.  Psych: Normal affect.  Labs    Chemistry Recent Labs  Lab 03/18/19 0451 03/19/19 0614  NA 135 137  K 3.5 4.1  CL 102 108  CO2 25 24  GLUCOSE  110* 126*  BUN 16 10  CREATININE 1.11 1.13  CALCIUM 8.7* 8.0*  PROT 7.0  --   ALBUMIN 4.0  --   AST 20  --   ALT 21  --   ALKPHOS 54  --   BILITOT 0.7  --   GFRNONAA >60 >60  GFRAA >60 >60  ANIONGAP 8 5     Hematology Recent Labs  Lab 03/18/19 0451 03/19/19 0614  WBC 7.5 6.3  RBC RESULTS UNAVAILABLE DUE TO INTERFERING SUBSTANCE 4.70  HGB 15.2 13.8  HCT RESULTS UNAVAILABLE DUE TO INTERFERING SUBSTANCE 38.1*  MCV RESULTS UNAVAILABLE DUE TO INTERFERING SUBSTANCE 81.1  MCH RESULTS UNAVAILABLE DUE TO INTERFERING SUBSTANCE 29.4  MCHC RESULTS UNAVAILABLE DUE TO INTERFERING SUBSTANCE 36.2*  RDW RESULTS UNAVAILABLE DUE TO INTERFERING SUBSTANCE 13.3  PLT 248 217    Cardiac EnzymesNo results for input(s): TROPONINI in the last 168 hours. No results for input(s): TROPIPOC in the last 168 hours.   BNPNo results for input(s): BNP, PROBNP in the last 168 hours.   DDimer No results for input(s): DDIMER in the last 168 hours.   Radiology    Dg Chest Portable 1 View  Result Date: 03/18/2019 IMPRESSION: No active cardiopulmonary abnormalities. Electronically Signed   By: Queen Slough.D.  On: 03/18/2019 05:20    Cardiac Studies   LHC 03/18/2019:  The left ventricular systolic function is normal.  LV end diastolic pressure is normal.  The left ventricular ejection fraction is 55-65% by visual estimate.  Mid LAD lesion is 80% stenosed.  Prox RCA lesion is 30% stenosed.  Dist RCA lesion is 100% stenosed.  Post intervention, there is a 0% residual stenosis.  A drug-eluting stent was successfully placed using a STENT RESOLUTE ONYX 2.25X22.  RPDA lesion is 40% stenosed.   1.  Significant two-vessel coronary artery disease.  The culprit is an occluded distal right coronary artery with faint left-to-right collaterals.  The supplied area is not large and that is the likely reason for nondiagnostic EKG changes for inferior STEMI.  There is also significant mid LAD stenosis  which appears to be hazy. 2.  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  Significantly elevated LVEDP in the low 30s. 3.  Successful angioplasty and drug-eluting stent placement to the distal RCA.  Recommendations: Continue dual antiplatelet therapy for at least 1 year.  Aggressive treatment of risk factors. Recommend staged PCI of the LAD before hospital discharge.  This is planned for Monday morning. __________  2D echo 03/18/2019: 1. Left ventricular ejection fraction, by visual estimation, is 60 to 65%. The left ventricle has normal function. There is mildly increased left ventricular hypertrophy.  2. Left ventricular diastolic parameters are consistent with Grade I diastolic dysfunction (impaired relaxation).  3. Global right ventricle has normal systolic function.The right ventricular size is normal. No increase in right ventricular wall thickness.  4. The mitral valve is normal in structure. No evidence of mitral valve regurgitation. No evidence of mitral stenosis.  5. The tricuspid valve is normal in structure. Tricuspid valve regurgitation is not demonstrated.  6. The aortic valve is normal in structure. Aortic valve regurgitation is not visualized. No evidence of aortic valve sclerosis or stenosis.  7. The pulmonic valve was normal in structure. Pulmonic valve regurgitation is trivial.  8. TR signal is inadequate for assessing pulmonary artery systolic pressure.  9. The inferior vena cava is normal in size with greater than 50% respiratory variability, suggesting right atrial pressure of 3 mmHg.  Patient Profile     53 y.o. male with history of HTN and ongoing tobacco abuse who is being seen today for the evaluation of NSTEMI.  Assessment & Plan    1. NSTEMI/CAD involving the native coronary arteries: -Admitted with NSTEMI on 11/14 -Underwent urgent LHC morning  of 11/14 secondary to ongoing 10/10 angina as detailed above -Status post PCI/DEs to the RCA with residual disease in the LAD with plans for staged PCI 03/20/2019 -DAPT with ASA and Effient for the next 12 months without interruption -Aggressive risk factor modification  -NPO at midnight -Lipitor as below -Post cath instructions discussed in detail  -Cardiac rehab  -Risks and benefits of cardiac catheterization have been discussed with the patient including risks of bleeding, bruising, infection, kidney damage, stroke, heart attack, urgent need for bypass, injury to a limb, and death. The patient understands these risks and is willing to proceed with the procedure. All questions have been answered and concerns listened to  2. Possible HCM: -Spade-like appearance of the LV on LV gram with a mid cavity gradient suggestive of HCM -Significantly elevated LVEDP in the low 30s mmHg -Gentle diuresis as allowed by BP -Plan for cMRI as   an outpatient, further recommendations pending  3. HTN: -Blood pressure is higher and improving into the low 100s to 120s systolic -Metoprolol currently on hold secondary to bradycardic rates, restart when/if able  4. HLD: -LDL of 116 this admission with a goal of < 70 -Started on Lipitor 40 mg this admission -Jeff Rios need follow up fasting labs as an outpatient in ~ 8 weeks, if LDL remains above goal at that time escalate Lipitor to 80 mg daily  5. Hyperglycemia: -A1c 5.4 this admission  6. Tobacco abuse: -Cessation is advised  -Declines nicotine patches while admitted at this time, though would like them at discharge    For questions or updates, please contact [facility] Please consult [email or web address] for contact info under Cardiology/STEMI.    Signed, Eula Listen, PA-C [facility] Pager: [phone] 03/19/2019, 7:54 AM  I have seen and examined this patient with Eula Listen.  Agree with above, note added to reflect my findings.  On exam, RRR, no  murmurs, lungs clear.  No current chest pain.  Respiratory status is stable.  He feels well this morning.  We Miya Luviano plan for repeat heart catheterization tomorrow with possible stenting of the LAD.  Kjell Brannen M. Wyeth Hoffer MD 03/19/2019 12:56 PM

## 2019-03-20 ENCOUNTER — Encounter: Payer: Self-pay | Admitting: Cardiovascular Disease

## 2019-03-20 ENCOUNTER — Encounter
Admission: EM | Disposition: A | Discharge status: Home / Self Care | Payer: Medicare Other | Source: Home / Self Care | Attending: Internal Medicine

## 2019-03-20 ENCOUNTER — Encounter
Admission: EM | Disposition: A | Discharge status: Home / Self Care | Payer: Self-pay | Source: Home / Self Care | Attending: Internal Medicine

## 2019-03-20 DIAGNOSIS — I2510 Atherosclerotic heart disease of native coronary artery without angina pectoris: Secondary | ICD-10-CM

## 2019-03-20 HISTORY — PX: CORONARY STENT INTERVENTION: CATH118234

## 2019-03-20 LAB — GLUCOSE, CAPILLARY: Glucose-Capillary: 94 mg/dL (ref 70–99)

## 2019-03-20 LAB — POCT ACTIVATED CLOTTING TIME
Activated Clotting Time: 323 seconds
Activated Clotting Time: 417 seconds

## 2019-03-20 SURGERY — LEFT HEART CATH AND CORONARY ANGIOGRAPHY
Anesthesia: Moderate Sedation

## 2019-03-20 SURGERY — CORONARY STENT INTERVENTION
Anesthesia: Moderate Sedation

## 2019-03-20 MED ORDER — SODIUM CHLORIDE 0.9 % IV SOLN: 250.0000 mL | INTRAVENOUS | Status: DC | PRN

## 2019-03-20 MED ORDER — MIDAZOLAM HCL 2 MG/2ML IJ SOLN
INTRAMUSCULAR | Status: AC
  Filled 2019-03-20: qty 2

## 2019-03-20 MED ORDER — ASPIRIN 81 MG PO CHEW
CHEWABLE_TABLET | ORAL | Status: AC
  Filled 2019-03-20: qty 1

## 2019-03-20 MED ORDER — HEPARIN SODIUM (PORCINE) 1000 UNIT/ML IJ SOLN
INTRAMUSCULAR | Status: DC | PRN
  Administered 2019-03-20: 8000 [IU] via INTRAVENOUS

## 2019-03-20 MED ORDER — PRASUGREL HCL 10 MG PO TABS
ORAL_TABLET | ORAL | Status: DC | PRN
  Administered 2019-03-20: 10 mg via ORAL

## 2019-03-20 MED ORDER — SODIUM CHLORIDE 0.9% FLUSH: 3.0000 mL | Freq: Two times a day (BID) | INTRAVENOUS | Status: DC

## 2019-03-20 MED ORDER — MIDAZOLAM HCL 2 MG/2ML IJ SOLN
INTRAMUSCULAR | Status: DC | PRN
  Administered 2019-03-20: 1 mg via INTRAVENOUS

## 2019-03-20 MED ORDER — VERAPAMIL HCL 2.5 MG/ML IV SOLN
INTRAVENOUS | Status: AC
  Filled 2019-03-20: qty 2

## 2019-03-20 MED ORDER — NITROGLYCERIN 1 MG/10 ML FOR IR/CATH LAB
INTRA_ARTERIAL | Status: DC | PRN
  Administered 2019-03-20: 200 ug via INTRACORONARY

## 2019-03-20 MED ORDER — NITROGLYCERIN 1 MG/10 ML FOR IR/CATH LAB
INTRA_ARTERIAL | Status: AC
  Filled 2019-03-20: qty 10

## 2019-03-20 MED ORDER — FENTANYL CITRATE (PF) 100 MCG/2ML IJ SOLN
INTRAMUSCULAR | Status: DC | PRN
  Administered 2019-03-20: 50 ug via INTRAVENOUS

## 2019-03-20 MED ORDER — ATORVASTATIN CALCIUM 80 MG PO TABS
80.0000 mg | ORAL_TABLET | Freq: Every day | ORAL | Status: DC
  Administered 2019-03-20: 80 mg via ORAL
  Filled 2019-03-20: qty 4
  Filled 2019-03-20: qty 1

## 2019-03-20 MED ORDER — SODIUM CHLORIDE 0.9% FLUSH
3.0000 mL | Freq: Two times a day (BID) | INTRAVENOUS | Status: DC
  Administered 2019-03-20 – 2019-03-21 (×2): 3 mL via INTRAVENOUS

## 2019-03-20 MED ORDER — SODIUM CHLORIDE 0.9% FLUSH: 3.0000 mL | INTRAVENOUS | Status: DC | PRN

## 2019-03-20 MED ORDER — SODIUM CHLORIDE 0.9 % IV SOLN
INTRAVENOUS | Status: AC | PRN
  Administered 2019-03-20: 3 mL/kg/h via INTRAVENOUS

## 2019-03-20 MED ORDER — IOHEXOL 300 MG/ML  SOLN
INTRAMUSCULAR | Status: DC | PRN
  Administered 2019-03-20: 55 mL

## 2019-03-20 MED ORDER — ACETAMINOPHEN 325 MG PO TABS
ORAL_TABLET | ORAL | Status: AC
  Filled 2019-03-20: qty 2

## 2019-03-20 MED ORDER — ASPIRIN 81 MG PO CHEW
CHEWABLE_TABLET | ORAL | Status: DC | PRN
  Administered 2019-03-20: 81 mg via ORAL

## 2019-03-20 MED ORDER — FENTANYL CITRATE (PF) 100 MCG/2ML IJ SOLN
INTRAMUSCULAR | Status: AC
  Filled 2019-03-20: qty 2

## 2019-03-20 MED ORDER — CARVEDILOL 3.125 MG PO TABS
3.1250 mg | ORAL_TABLET | Freq: Two times a day (BID) | ORAL | Status: DC
  Administered 2019-03-21: 3.125 mg via ORAL
  Filled 2019-03-20 (×2): qty 1

## 2019-03-20 MED ORDER — VERAPAMIL HCL 2.5 MG/ML IV SOLN
INTRAVENOUS | Status: DC | PRN
  Administered 2019-03-20: 2.5 mg via INTRAVENOUS

## 2019-03-20 MED ORDER — HEPARIN SODIUM (PORCINE) 1000 UNIT/ML IJ SOLN
INTRAMUSCULAR | Status: AC
  Filled 2019-03-20: qty 1

## 2019-03-20 MED ORDER — HEPARIN (PORCINE) IN NACL 1000-0.9 UT/500ML-% IV SOLN
INTRAVENOUS | Status: AC
  Filled 2019-03-20: qty 1000

## 2019-03-20 MED ORDER — HEPARIN (PORCINE) IN NACL 1000-0.9 UT/500ML-% IV SOLN
INTRAVENOUS | Status: DC | PRN
  Administered 2019-03-20: 500 mL

## 2019-03-20 MED ORDER — SODIUM CHLORIDE 0.9 % WEIGHT BASED INFUSION
1.0000 mL/kg/h | INTRAVENOUS | Status: AC
  Administered 2019-03-20 (×2): 1 mL/kg/h via INTRAVENOUS

## 2019-03-20 MED ORDER — PRASUGREL HCL 10 MG PO TABS
ORAL_TABLET | ORAL | Status: AC
  Filled 2019-03-20: qty 1

## 2019-03-20 MED ORDER — SODIUM CHLORIDE 0.9 % IV SOLN: INTRAVENOUS | Status: DC

## 2019-03-20 SURGICAL SUPPLY — 13 items
BALLN TREK RX 2.5X12 (BALLOONS) ×2
BALLN ~~LOC~~ TREK RX 3.25X12 (BALLOONS) ×2
BALLOON TREK RX 2.5X12 (BALLOONS) ×1 IMPLANT
BALLOON ~~LOC~~ TREK RX 3.25X12 (BALLOONS) ×1 IMPLANT
CATH LAUNCHER 6FR EBU3.5 (CATHETERS) ×2 IMPLANT
DEVICE INFLAT 30 PLUS (MISCELLANEOUS) ×2 IMPLANT
DEVICE RAD TR BAND REGULAR (VASCULAR PRODUCTS) ×2 IMPLANT
GLIDESHEATH SLEND SS 6F .021 (SHEATH) ×2 IMPLANT
KIT MANI 3VAL PERCEP (MISCELLANEOUS) ×2 IMPLANT
PACK CARDIAC CATH (CUSTOM PROCEDURE TRAY) ×2 IMPLANT
STENT RESOLUTE ONYX 3.0X15 (Permanent Stent) ×2 IMPLANT
WIRE INTUITION PROPEL ST 180CM (WIRE) ×2 IMPLANT
WIRE ROSEN-J .035X260CM (WIRE) ×2 IMPLANT

## 2019-03-20 NOTE — Progress Notes (Addendum)
Progress Note  Patient Name: Jeff RicksMickey Rios Date of Encounter: 03/20/2019  Primary Cardiologist: [facility] - consult by Kirke CorinArida  Subjective   He is feeling much better with no further chest pain or shortness of breath.  He underwent staged LAD PCI this morning.  Inpatient Medications    Scheduled Meds: . [MAR Hold] aspirin EC  81 mg Oral Daily  . [MAR Hold] atorvastatin  40 mg Oral q1800  . [MAR Hold] Chlorhexidine Gluconate Cloth  6 each Topical Daily  . [MAR Hold] enoxaparin (LOVENOX) injection  40 mg Subcutaneous Q24H  . [MAR Hold] prasugrel  10 mg Oral Daily  . [MAR Hold] sodium chloride flush  3 mL Intravenous Q12H  . sodium chloride flush  3 mL Intravenous Q12H   Continuous Infusions: . sodium chloride    . [START ON 03/21/2019] sodium chloride     PRN Meds: sodium chloride, [MAR Hold] acetaminophen, [MAR Hold] ALPRAZolam, [MAR Hold]  morphine injection, [MAR Hold] nitroGLYCERIN, [MAR Hold] ondansetron (ZOFRAN) IV, sodium chloride flush, [MAR Hold] zolpidem   Vital Signs    Vitals:   03/20/19 0400 03/20/19 0414 03/20/19 0659 03/20/19 0744  BP:  111/79 118/68   Pulse:  66 72   Resp: 17 19 20    Temp: 97.9 F (36.6 C)  98 F (36.7 C)   TempSrc: Oral  Oral   SpO2:  97% 97% 100%  Weight:      Height:        Intake/Output Summary (Last 24 hours) at 03/20/2019 0847 Last data filed at 03/20/2019 0400 Gross per 24 hour  Intake 360 ml  Output 700 ml  Net -340 ml   Filed Weights   03/18/19 0454 03/18/19 0831 03/18/19 1330  Weight: 72.6 kg 81.3 kg 85.5 kg    Telemetry    SR - Personally Reviewed  ECG    No new tracings - Personally Reviewed  Physical Exam   GEN: No acute distress.   Neck: No JVD. Cardiac: RRR, no murmurs, rubs, or gallops. Right radial cardiac cath site is well healing without bleeding, bruising, swelling, warmth, erythema, or TTP. Radial pulse 2+.  Respiratory: Clear to auscultation bilaterally.  GI: Soft, nontender,  non-distended.   MS: No edema; No deformity. Neuro:  Alert and oriented x 3; Nonfocal.  Psych: Normal affect.  Labs    Chemistry Recent Labs  Lab 03/18/19 0451 03/19/19 0614  NA 135 137  K 3.5 4.1  CL 102 108  CO2 25 24  GLUCOSE 110* 126*  BUN 16 10  CREATININE 1.11 1.13  CALCIUM 8.7* 8.0*  PROT 7.0  --   ALBUMIN 4.0  --   AST 20  --   ALT 21  --   ALKPHOS 54  --   BILITOT 0.7  --   GFRNONAA >60 >60  GFRAA >60 >60  ANIONGAP 8 5     Hematology Recent Labs  Lab 03/18/19 0451 03/19/19 0614  WBC 7.5 6.3  RBC RESULTS UNAVAILABLE DUE TO INTERFERING SUBSTANCE 4.70  HGB 15.2 13.8  HCT RESULTS UNAVAILABLE DUE TO INTERFERING SUBSTANCE 38.1*  MCV RESULTS UNAVAILABLE DUE TO INTERFERING SUBSTANCE 81.1  MCH RESULTS UNAVAILABLE DUE TO INTERFERING SUBSTANCE 29.4  MCHC RESULTS UNAVAILABLE DUE TO INTERFERING SUBSTANCE 36.2*  RDW RESULTS UNAVAILABLE DUE TO INTERFERING SUBSTANCE 13.3  PLT 248 217    Cardiac EnzymesNo results for input(s): TROPONINI in the last 168 hours. No results for input(s): TROPIPOC in the last 168 hours.   BNPNo  results for input(s): BNP, PROBNP in the last 168 hours.   DDimer No results for input(s): DDIMER in the last 168 hours.   Radiology    Dg Chest Portable 1 View  Result Date: 03/18/2019 IMPRESSION: No active cardiopulmonary abnormalities. Electronically Signed   By: Signa Kell M.D.   On: 03/18/2019 05:20    Cardiac Studies   LHC 03/18/2019:  The left ventricular systolic function is normal.  LV end diastolic pressure is normal.  The left ventricular ejection fraction is 55-65% by visual estimate.  Mid LAD lesion is 80% stenosed.  Prox RCA lesion is 30% stenosed.  Dist RCA lesion is 100% stenosed.  Post intervention, there is a 0% residual stenosis.  A drug-eluting stent was successfully placed using a STENT RESOLUTE ONYX 2.25X22.  RPDA lesion is 40% stenosed.   1.  Significant two-vessel coronary artery disease.  The  culprit is an occluded distal right coronary artery with faint left-to-right collaterals.  The supplied area is not large and that is the likely reason for nondiagnostic EKG changes for inferior STEMI.  There is also significant mid LAD stenosis which appears to be hazy. 2.  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  Significantly elevated LVEDP in the low 30s. 3.  Successful angioplasty and drug-eluting stent placement to the distal RCA.  Recommendations: Continue dual antiplatelet therapy for at least 1 year.  Aggressive treatment of risk factors. Recommend staged PCI of the LAD before hospital discharge.  This is planned for Monday morning. __________  2D echo 03/18/2019: 1. Left ventricular ejection fraction, by visual estimation, is 60 to 65%. The left ventricle has normal function. There is mildly increased left ventricular hypertrophy.  2. Left ventricular diastolic parameters are consistent with Grade I diastolic dysfunction (impaired relaxation).  3. Global right ventricle has normal systolic function.The right ventricular size is normal. No increase in right ventricular wall thickness.  4. The mitral valve is normal in structure. No evidence of mitral valve regurgitation. No evidence of mitral stenosis.  5. The tricuspid valve is normal in structure. Tricuspid valve regurgitation is not demonstrated.  6. The aortic valve is normal in structure. Aortic valve regurgitation is not visualized. No evidence of aortic valve sclerosis or stenosis.  7. The pulmonic valve was normal in structure. Pulmonic valve regurgitation is trivial.  8. TR signal is inadequate for assessing pulmonary artery systolic pressure.  9. The inferior vena cava is normal in size with greater than 50% respiratory variability, suggesting right atrial pressure of 3 mmHg.  Patient Profile     53 y.o.  male with history of HTN and ongoing tobacco abuse who is being seen today for the evaluation of NSTEMI.  Assessment & Plan    1. NSTEMI/CAD involving the native coronary arteries: -Admitted with NSTEMI on 11/14.  Due to persistent chest pain, he underwent urgent cardiac catheterization on Saturday which showed occluded distal RCA which was treated successfully with PCI and drug-eluting stent placement. He underwent staged LAD PCI this morning without complications. -Continue dual antiplatelet therapy with aspirin and prasugrel for at least 12 months.  Prasugrel is generic. -Continue high-dose atorvastatin -No ACE inhibitors or ARB due to normal ejection fraction and relatively low blood pressure. -The patient was referred to cardiac rehab -I instructed him to stay off work for 2 weeks.  2. Possible HCM: -Spade-like appearance of the LV on LV gram with a mild  mid cavity gradient suggestive of HCM -I reviewed his echocardiogram and there was no significant left ventricular hypertrophy.  Given that the gradient was mild, I do not think further work-up is needed for this.  3. HTN: -Patient had angioedema earlier this month thought to be due to amlodipine given no other explanation.  He was on metoprolol prior to his myocardial infarction.  He did have intermittent bradycardia but he is no longer bradycardic and blood pressure is elevated.  Thus, I elected to start small dose carvedilol.  If blood pressure remains elevated, consider an ARB.  4. HLD: -Continue high-dose atorvastatin  5. Hyperglycemia: Likely reactive -A1c 5.4 this admission  6. Tobacco abuse: -Cessation is advised  -Declines nicotine patches while admitted at this time, though would like them at discharge    Possible discharge home tomorrow. Stay off work for 2 weeks. Follow-up in our office in 1 to 2 weeks.  For questions or updates, please contact [place] Please consult [email or web address] for contact info under  Cardiology/STEMI.    Signed, Kathlyn Sacramento, MD [facility] 03/20/2019, 8:47 AM

## 2019-03-20 NOTE — Progress Notes (Signed)
PROGRESS NOTE    Jeff Rios  PYK:[id number] DOB: 1966-02-29 DOA: 03/18/2019 PCP: Patient, [name]   Brief Narrative:  53 year old African-American male with a known history of hypertension who presented to the ER with chest pain that started approximately 1:00 in the morning. Patient reported substernal with chest burning and tightness. He denied radiation dyspnea palpitations. He did endorse continued tobacco dependence. In the emergency room patient was found to have a mildly elevated troponin which increased on subsequent troponin. He also was noted to have ST changes which was reviewed by cardiology and felt not to be an ST elevation MI. They did recommend heparin. After admission patient continued chest pain repeat EKG cardiology reviewed did not feel it was an ST elevation MI but because of persistent chest pain in the setting of heparin are taken the patient to the cath lab for catheterization   Assessment & Plan:   Active Problems:   Chest pain   ACS (acute coronary syndrome) (HCC)   Non-ST elevation (NSTEMI) myocardial infarction (HCC)   HTN (hypertension)   HLD (hyperlipidemia)   Non-ST elevation MI: Status post left heart cath 1114 with PCI/DES to the RCA S/P staged cath  Monday for residual disease in LAD Continue dual antiplatelet therapy with aspirin and Effient for 12 months Cont Lipitor Prob d/c tues  Hypertension: Patient was taking metoprolol preadmission Holding post-cath secondary bradycardia titrated as possible per cards  Hyperlipidemia Continue Lipitor LDL 116 goal is less than 70  Hyperglycemia hemoglobin A1c controlled at 5.4  DVT prophylaxis: Lovenox SQ  Code Status: full    Code Status Orders  (From admission, onward)         Start     Ordered   03/18/19 0615  Full code  Continuous     03/18/19 0619        Code Status History    This patient has a current code status but no historical code status.   Advance Care  Planning Activity     Family Communication: discussed in detail with patient Disposition Plan:   Patient remained in patient post catheterization management in the ICU, patient not medically stable for discharge Consults called: None Admission status: Inpatient   Consultants:   Cardiology  Procedures:  Dg Chest Portable 1 View  Result Date: 03/18/2019 CLINICAL DATA:  Chest pain. Code STEMI. EXAM: PORTABLE CHEST 1 VIEW COMPARISON:  03/05/2019 FINDINGS: The heart size appears within normal limits. There is no pleural effusion or edema. The lungs are clear. Visualized osseous structures are unremarkable. IMPRESSION: No active cardiopulmonary abnormalities. Electronically Signed   By: Kerby Moors M.D.   On: 03/18/2019 05:20   Dg Chest Portable 1 View  Result Date: 03/05/2019 CLINICAL DATA:  Allergic reaction, tongue swelling, rash, shortness of breath, smoker EXAM: PORTABLE CHEST 1 VIEW COMPARISON:  Portable exam 2220 hours compared to 08/12/2016 FINDINGS: Borderline enlargement of cardiac silhouette. Mediastinal contours and pulmonary vascularity normal. Mild chronic elevation of RIGHT diaphragm with mild RIGHT basilar atelectasis. Remaining lungs clear. No pleural effusion or pneumothorax. IMPRESSION: Mild RIGHT basilar atelectasis. Electronically Signed   By: Lavonia Dana M.D.   On: 03/05/2019 22:28     Antimicrobials:   None   Subjective: Patient stable without complications no chest pain  Objective: Vitals:   03/20/19 0930 03/20/19 1000 03/20/19 1100 03/20/19 1200  BP: 134/83 128/85 133/90 (!) 125/96  Pulse: (!) 59 72 78 67  Resp: 16 (!) 25 19 20   Temp:  97.7 F (36.5  C)    TempSrc:  Oral    SpO2: 99% 98% 98% 99%  Weight:      Height:        Intake/Output Summary (Last 24 hours) at 03/20/2019 1241 Last data filed at 03/20/2019 1200 Gross per 24 hour  Intake 392.78 ml  Output 700 ml  Net -307.22 ml   Filed Weights   03/18/19 0454 03/18/19 0831 03/18/19 1330   Weight: 72.6 kg 81.3 kg 85.5 kg    Examination:  General exam: Appears calm and comfortable  Respiratory system: Clear to auscultation. Respiratory effort normal. Cardiovascular system: Bradycardic moderately, no murmurs noted Gastrointestinal system: Abdomen is nondistended, soft and nontender. No organomegaly or masses felt. Normal bowel sounds heard. Central nervous system: Alert and oriented. No focal neurological deficits. Extremities: Radial puncture site for heart cath stable remains without evidence of bleeding or swelling, otherwise exam normal warm well perfused. Skin: No rashes, lesions or ulcers Psychiatry: Judgement and insight appear normal. Mood & affect appropriate..     Data Reviewed: I have personally reviewed following labs and imaging studies  CBC: Recent Labs  Lab 03/18/19 0451 03/19/19 0614  WBC 7.5 6.3  NEUTROABS 4.5  --   HGB 15.2 13.8  HCT RESULTS UNAVAILABLE DUE TO INTERFERING SUBSTANCE 38.1*  MCV RESULTS UNAVAILABLE DUE TO INTERFERING SUBSTANCE 81.1  PLT 248 217   Basic Metabolic Panel: Recent Labs  Lab 03/18/19 0451 03/19/19 0614  NA 135 137  K 3.5 4.1  CL 102 108  CO2 25 24  GLUCOSE 110* 126*  BUN 16 10  CREATININE 1.11 1.13  CALCIUM 8.7* 8.0*   GFR: Estimated Creatinine Clearance: 74.7 mL/min (by C-G formula based on SCr of 1.13 mg/dL). Liver Function Tests: Recent Labs  Lab 03/18/19 0451  AST 20  ALT 21  ALKPHOS 54  BILITOT 0.7  PROT 7.0  ALBUMIN 4.0   Recent Labs  Lab 03/18/19 0451  LIPASE 38   No results for input(s): AMMONIA in the last 168 hours. Coagulation Profile: Recent Labs  Lab 03/18/19 0451  INR 1.0   Cardiac Enzymes: No results for input(s): CKTOTAL, CKMB, CKMBINDEX, TROPONINI in the last 168 hours. BNP (last 3 results) No results for input(s): PROBNP in the last 8760 hours. HbA1C: Recent Labs    03/18/19 1330  HGBA1C 5.4   CBG: No results for input(s): GLUCAP in the last 168 hours. Lipid  Profile: Recent Labs    03/18/19 0701  CHOL 164  HDL 42  LDLCALC 116*  TRIG 32  CHOLHDL 3.9   Thyroid Function Tests: No results for input(s): TSH, T4TOTAL, FREET4, T3FREE, THYROIDAB in the last 72 hours. Anemia Panel: No results for input(s): VITAMINB12, FOLATE, FERRITIN, TIBC, IRON, RETICCTPCT in the last 72 hours. Sepsis Labs: No results for input(s): PROCALCITON, LATICACIDVEN in the last 168 hours.  Recent Results (from the past 240 hour(s))  SARS CORONAVIRUS 2 (TAT 6-24 HRS) Nasopharyngeal Nasopharyngeal Swab     Status: None   Collection Time: 03/18/19  4:51 AM   Specimen: Nasopharyngeal Swab  Result Value Ref Range Status   SARS Coronavirus 2 NEGATIVE NEGATIVE Final    Comment: (NOTE) SARS-CoV-2 target nucleic acids are NOT DETECTED. The SARS-CoV-2 RNA is generally detectable in upper and lower respiratory specimens during the acute phase of infection. Negative results do not preclude SARS-CoV-2 infection, do not rule out co-infections with other pathogens, and should not be used as the sole basis for treatment or other patient management decisions. Negative results must  be combined with clinical observations, patient history, and epidemiological information. The expected result is Negative. Fact Sheet for Patients: HairSlick.no Fact Sheet for Healthcare Providers: quierodirigir.com This test is not yet approved or cleared by the [place] FDA and  has been authorized for detection and/or diagnosis of SARS-CoV-2 by FDA under an Emergency Use Authorization (EUA). This EUA will remain  in effect (meaning this test can be used) for the duration of the COVID-19 declaration under Section 56 4(b)(1) of the Act, 21 U.S.C. section 360bbb-3(b)(1), unless the authorization is terminated or revoked sooner. Performed at [facility], [address], [place], [place] [address]   SARS Coronavirus 2 by RT PCR  (hospital order, performed in [facility] hospital lab) Nasopharyngeal Nasopharyngeal Swab     Status: None   Collection Time: 03/18/19 10:50 AM   Specimen: Nasopharyngeal Swab  Result Value Ref Range Status   SARS Coronavirus 2 NEGATIVE NEGATIVE Final    Comment: (NOTE) If result is NEGATIVE SARS-CoV-2 target nucleic acids are NOT DETECTED. The SARS-CoV-2 RNA is generally detectable in upper and lower  respiratory specimens during the acute phase of infection. The lowest  concentration of SARS-CoV-2 viral copies this assay can detect is 250  copies / mL. A negative result does not preclude SARS-CoV-2 infection  and should not be used as the sole basis for treatment or other  patient management decisions.  A negative result may occur with  improper specimen collection / handling, submission of specimen other  than nasopharyngeal swab, presence of viral mutation(s) within the  areas targeted by this assay, and inadequate number of viral copies  (<250 copies / mL). A negative result must be combined with clinical  observations, patient history, and epidemiological information. If result is POSITIVE SARS-CoV-2 target nucleic acids are DETECTED. The SARS-CoV-2 RNA is generally detectable in upper and lower  respiratory specimens dur ing the acute phase of infection.  Positive  results are indicative of active infection with SARS-CoV-2.  Clinical  correlation with patient history and other diagnostic information is  necessary to determine patient infection status.  Positive results do  not rule out bacterial infection or co-infection with other viruses. If result is PRESUMPTIVE POSTIVE SARS-CoV-2 nucleic acids MAY BE PRESENT.   A presumptive positive result was obtained on the submitted specimen  and confirmed on repeat testing.  While 2019 novel coronavirus  (SARS-CoV-2) nucleic acids may be present in the submitted sample  additional confirmatory testing may be necessary for  epidemiological  and / or clinical management purposes  to differentiate between  SARS-CoV-2 and other Sarbecovirus currently known to infect humans.  If clinically indicated additional testing with an alternate test  methodology [id number]) is advised. The SARS-CoV-2 RNA is generally  detectable in upper and lower respiratory sp ecimens during the acute  phase of infection. The expected result is Negative. Fact Sheet for Patients:  BoilerBrush.com.cy Fact Sheet for Healthcare Providers: https://pope.com/ This test is not yet approved or cleared by the [place] FDA and has been authorized for detection and/or diagnosis of SARS-CoV-2 by FDA under an Emergency Use Authorization (EUA).  This EUA will remain in effect (meaning this test can be used) for the duration of the COVID-19 declaration under Section 564(b)(1) of the Act, 21 U.S.C. section 360bbb-3(b)(1), unless the authorization is terminated or revoked sooner. Performed at [facility], [address], [place], [place] [address]   MRSA PCR Screening     Status: None   Collection Time:  03/18/19  3:38 PM   Specimen: Nasopharyngeal  Result Value Ref Range Status   MRSA by PCR NEGATIVE NEGATIVE Final    Comment:        The GeneXpert MRSA Assay (FDA approved for NASAL specimens only), is one component of a comprehensive MRSA colonization surveillance program. It is not intended to diagnose MRSA infection nor to guide or monitor treatment for MRSA infections. Performed at [facility], [address] [place], [place] [address]          Radiology Studies: No results found.      Scheduled Meds: . aspirin EC  81 mg Oral Daily  . atorvastatin  80 mg Oral q1800  . Chlorhexidine Gluconate Cloth  6 each Topical Daily  . enoxaparin (LOVENOX) injection  40 mg Subcutaneous Q24H  . prasugrel  10 mg Oral Daily  . sodium chloride flush  3 mL Intravenous  Q12H  . sodium chloride flush  3 mL Intravenous Q12H   Continuous Infusions: . sodium chloride    . sodium chloride 1 mL/kg/hr (03/20/19 1137)     LOS: 2 days    Time spent: 35 min    Burke Keelshristopher Precious Gilchrest, MD [facility]  If 7PM-7AM, please contact night-coverage  03/20/2019, 12:41 PM

## 2019-03-20 NOTE — Interval H&P Note (Signed)
History and Physical Interval Note:  03/20/2019 7:53 AM  Jeff Rios  has presented today for surgery, with the diagnosis of NSTEMI.  The various methods of treatment have been discussed with the patient and family. After consideration of risks, benefits and other options for treatment, the patient has consented to  Procedure(s): CORONARY STENT INTERVENTION (N/A) as a surgical intervention.  The patient's history has been reviewed, patient examined, no change in status, stable for surgery.  I have reviewed the patient's chart and labs.  Questions were answered to the patient's satisfaction.     Kathlyn [place]

## 2019-03-20 NOTE — Progress Notes (Signed)
Pt has been in NSR since being back from cath lab. (pt was in cath lab in beginning of shift). Pt had TR band removed and currently has a little swelling but has been being monitored and dr Fletcher Anon looked at it and continuing to monitor no intervention needed or needing to be done. Pt has remained AOX4, on RA, currently resting comfortably watching tv Report was called into Debbie on 2A.

## 2019-03-20 NOTE — Progress Notes (Signed)
Ch visited pt during rounds. Pt is a 53 y.o. male that has a a hx of heart problems. Pt came in as a STEMI and had surgery on 11.15.20 which the pt shared that he has recovered well from. 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 Ch allowed space for the pt to process his recent health changes and the death of his mother 1 or two years ago which is he still grieves. Ch provided a compassionate presence and communicated with the staff regarding the pt wanting assistance contacting his job at the [employer] about being hospitalized.  F/ u care recommended    03/20/19 1500  Clinical Encounter Type  Visited With Patient  Visit Type Psychological support;Spiritual support;Social support  Spiritual Encounters  Spiritual Needs Emotional;Grief support  Stress Factors  Patient Stress Factors Health changes;Loss of control;Major life changes  Family Stress Factors None identified

## 2019-03-21 ENCOUNTER — Telehealth: Payer: Self-pay | Admitting: *Deleted

## 2019-03-21 LAB — CBC
HCT: 39 % (ref 39.0–52.0)
Hemoglobin: 14.3 g/dL (ref 13.0–17.0)
MCH: 29.5 pg (ref 26.0–34.0)
MCHC: 36.7 g/dL — ABNORMAL HIGH (ref 30.0–36.0)
MCV: 80.6 fL (ref 80.0–100.0)
Platelets: 208 10*3/uL (ref 150–400)
RBC: 4.84 MIL/uL (ref 4.22–5.81)
RDW: 13.2 % (ref 11.5–15.5)
WBC: 6.8 10*3/uL (ref 4.0–10.5)
nRBC: 0 % (ref 0.0–0.2)

## 2019-03-21 LAB — BASIC METABOLIC PANEL
Anion gap: 4 — ABNORMAL LOW (ref 5–15)
BUN: 13 mg/dL (ref 6–20)
CO2: 23 mmol/L (ref 22–32)
Calcium: 8.3 mg/dL — ABNORMAL LOW (ref 8.9–10.3)
Chloride: 109 mmol/L (ref 98–111)
Creatinine, Ser: 1.09 mg/dL (ref 0.61–1.24)
GFR calc Af Amer: >60 mL/min (ref >60)
GFR calc non Af Amer: >60 mL/min (ref >60)
Glucose, Bld: 88 mg/dL (ref 70–99)
Potassium: 4.2 mmol/L (ref 3.5–5.1)
Sodium: 136 mmol/L (ref 135–145)

## 2019-03-21 MED ORDER — CARVEDILOL 3.125 MG PO TABS: 3.1250 mg | ORAL_TABLET | Freq: Two times a day (BID) | ORAL | 11 refills | Status: DC

## 2019-03-21 MED ORDER — ASPIRIN 81 MG PO TBEC: 81.0000 mg | DELAYED_RELEASE_TABLET | Freq: Every day | ORAL | 11 refills | Status: DC

## 2019-03-21 MED ORDER — ATORVASTATIN CALCIUM 80 MG PO TABS: 80.0000 mg | ORAL_TABLET | Freq: Every day | ORAL | 11 refills | Status: DC

## 2019-03-21 MED ORDER — NITROGLYCERIN 0.4 MG SL SUBL: 0.4000 mg | SUBLINGUAL_TABLET | SUBLINGUAL | 12 refills | Status: DC | PRN

## 2019-03-21 MED ORDER — NITROGLYCERIN 0.4 MG SL SUBL: 0.4000 mg | SUBLINGUAL_TABLET | SUBLINGUAL | 12 refills | Status: AC | PRN

## 2019-03-21 MED ORDER — PRASUGREL HCL 10 MG PO TABS: 10.0000 mg | ORAL_TABLET | Freq: Every day | ORAL | 11 refills | Status: DC

## 2019-03-21 NOTE — TOC Transition Note (Signed)
Transition of Care [facility]) - CM/SW Discharge Note   Patient Details  Name: Jeff Rios MRN: [id number] Date of Birth: [birth year]  Transition of Care [facility]) CM/SW Contact:  Candie Chroman, LCSW Phone Number: 03/21/2019, 1:28 PM   Clinical Narrative: CSW met with patient. No supports at bedside. CSW introduced role and explained inquired about medication needs. Patient has Medicare Part A & B but no Part D to cover medications. Patient can get his medications at [facility] for around $250 or [facility] for $60. Gave him GoodRx coupons for both and encouraged him to get the app on his smartphone. Patient's PCP is at [facility] and he said he can get medications for lower cost there as well. CSW was on hold for 25 minutes and had to leave a voicemail. Provided patient's phone number so they can call him to schedule an appt. No further concerns. Patient has orders to discharge home today. Either his sister or niece will pick him up.CSW signing off.    Final next level of care: Home/Self Care Barriers to Discharge: Barriers Resolved   Patient Goals and CMS Choice     Choice offered to / list presented to : NA  Discharge Placement                       Discharge Plan and Services     Post Acute Care Choice: NA                               Social Determinants of Health (SDOH) Interventions     Readmission Risk Interventions No flowsheet data found.

## 2019-03-21 NOTE — Care Management Important Message (Signed)
Important Message  Patient Details  Name: Jeff Rios MRN: [id number] Date of Birth: 1965-06-11   Medicare Important Message Given:  Yes     Dannette Barbara 03/21/2019, 11:29 AM

## 2019-03-21 NOTE — Progress Notes (Signed)
Iv and telemetry removed for discharge. Medications and discharge instructions reviewed with pt. Understanding verbalized. Pt pushed out to vehicle in w/c , no distress noted.

## 2019-03-21 NOTE — Telephone Encounter (Signed)
-----   Message from Nelva Bush, MD sent at 03/21/2019  9:28 AM EST ----- Regarding: TOC Hello,  Could you arrange for a [facility] appointment for Mr. Jeff Rios with Dr. Fletcher Anon or an APP within 2 weeks?  He will be discharged later today from [facility] after being admitted with NSTEMI s/p PCI x 2.  Thanks.  Gerald Stabs

## 2019-03-21 NOTE — Discharge Summary (Signed)
Physician Discharge Summary  Jeff Rios RUE:[id number] DOB: [birth year] DOA: 03/18/2019  PCP: Patient, [name]  Admit date: 03/18/2019 Discharge date: 03/21/2019  Admitted From: Inpatient Disposition: home  Recommendations for Outpatient Follow-up:  1. Follow up with PCP in 1-2 weeks 2. Follow-up with cardiology to be arranged by cardiology  Home Health:No Equipment/Devices:none  Discharge Condition:Stable CODE STATUS:Full code Diet recommendation: Cardiac diet  Brief/Interim Summary: 53 year old African-American male with a known history of hypertension who presented to the ER with chest pain that started approximately 1:00 in the morning. Patient reported substernal with chest burning and tightness. He denied radiation dyspnea palpitations. He did endorse continued tobacco dependence. In the emergency room patient was found to have a mildly elevated troponin which increased on subsequent troponin. He also was noted to have ST changes which was reviewed by cardiology and felt not to be an ST elevation MI. They did recommend heparin. After admission patient continued chest pain repeat EKG cardiology reviewed did not feel it was an ST elevation MI but because of persistent chest pain in the setting of heparin are taken the patient to the cath lab for catheterization  Hospital course: Non-ST elevation MI: Status post left heart cath 1114 with PCI/DES to the RCA S/P staged cath  Monday for residual disease in LAD with angioplasty and drug-eluting stent to the mid LAD Continue dual antiplatelet therapy with aspirin and Effient for 12 months ContLipitor Continue low-dose Coreg Is a marginal blood pressure no additional medications were added.  Cardiology with plans to titrate as an outpatient Stable for d/c tues  Hypertension: Patient was taking metoprolol preadmission this was discontinued Started Coreg as above Holding post-cath secondary bradycardia titrated as  possible per cards  Hyperlipidemia Continue Lipitor LDL 116 goal is less than 70  Hyperglycemia hemoglobin A1c controlled at 5.4  Discharge Diagnoses:  Active Problems:   Chest pain   ACS (acute coronary syndrome) (HCC)   Non-ST elevation (NSTEMI) myocardial infarction (HCC)   HTN (hypertension)   HLD (hyperlipidemia)    Discharge Instructions  Discharge Instructions    AMB Referral to Cardiac Rehabilitation - Phase II   Complete by: As directed    Diagnosis:  Coronary Stents NSTEMI     After initial evaluation and assessments completed: Virtual Based Care may be provided alone or in conjunction with Phase 2 Cardiac Rehab based on patient barriers.: Yes   Call MD for:   Complete by: As directed    Change in medical condition to include but not limited to nausea vomiting chest pain shortness of breath fevers   Call MD for:  difficulty breathing, headache or visual disturbances   Complete by: As directed    Call MD for:  extreme fatigue   Complete by: As directed    Call MD for:  hives   Complete by: As directed    Call MD for:  persistant dizziness or light-headedness   Complete by: As directed    Call MD for:  persistant nausea and vomiting   Complete by: As directed    Call MD for:  redness, tenderness, or signs of infection (pain, swelling, redness, odor or green/yellow discharge around incision site)   Complete by: As directed    Call MD for:  severe uncontrolled pain   Complete by: As directed    Call MD for:  temperature >100.4   Complete by: As directed    Diet - low sodium heart healthy   Complete by: As directed  Discharge instructions   Complete by: As directed    No work per cards for 2 weeks   Increase activity slowly   Complete by: As directed      Allergies as of 03/21/2019      Reactions   Amlodipine Swelling   Patient states he had an allergic reaction and was possibly from amlodipine. Taken off that and switched to metoprolol.        Medication List    STOP taking these medications   metoprolol tartrate 25 MG tablet Commonly known as: LOPRESSOR     TAKE these medications   aspirin 81 MG EC tablet Take 1 tablet (81 mg total) by mouth daily. Start taking on: March 22, 2019   atorvastatin 80 MG tablet Commonly known as: LIPITOR Take 1 tablet (80 mg total) by mouth daily at 6 PM.   carvedilol 3.125 MG tablet Commonly known as: COREG Take 1 tablet (3.125 mg total) by mouth 2 (two) times daily with a meal.   nitroGLYCERIN 0.4 MG SL tablet Commonly known as: NITROSTAT Place 1 tablet (0.4 mg total) under the tongue every 5 (five) minutes as needed for chest pain.   prasugrel 10 MG Tabs tablet Commonly known as: EFFIENT Take 1 tablet (10 mg total) by mouth daily. Start taking on: March 22, 2019      Follow-up Information    [facility] Follow up.   Specialty: Pulmonary Rehab Why: Your Cardiologist has referred you to outpatient Cardiac Rehab at [facility].  The Cardiac Rehab department will contact you within one to two weeks after discharge to schedule your first appointment.   Contact information: [place] [id number] [place] [address] [phone]         Allergies  Allergen Reactions  . Amlodipine Swelling    Patient states he had an allergic reaction and was possibly from amlodipine. Taken off that and switched to metoprolol.     Consultations:  cardiology   Procedures/Studies: Dg Chest Portable 1 View  Result Date: 03/18/2019 CLINICAL DATA:  Chest pain. Code STEMI. EXAM: PORTABLE CHEST 1 VIEW COMPARISON:  03/05/2019 FINDINGS: The heart size appears within normal limits. There is no pleural effusion or edema. The lungs are clear. Visualized osseous structures are unremarkable. IMPRESSION: No active cardiopulmonary abnormalities. Electronically Signed   By: Kerby Moors M.D.   On: 03/18/2019 05:20   Dg Chest Portable 1  View  Result Date: 03/05/2019 CLINICAL DATA:  Allergic reaction, tongue swelling, rash, shortness of breath, smoker EXAM: PORTABLE CHEST 1 VIEW COMPARISON:  Portable exam 2220 hours compared to 08/12/2016 FINDINGS: Borderline enlargement of cardiac silhouette. Mediastinal contours and pulmonary vascularity normal. Mild chronic elevation of RIGHT diaphragm with mild RIGHT basilar atelectasis. Remaining lungs clear. No pleural effusion or pneumothorax. IMPRESSION: Mild RIGHT basilar atelectasis. Electronically Signed   By: Lavonia Dana M.D.   On: 03/05/2019 22:28       Subjective: Patient reports no acute problems overnight Chest pain No complications from cath site  Discharge Exam: Vitals:   03/21/19 0336 03/21/19 0744  BP: 133/78 (!) 126/97  Pulse: 66 74  Resp:  18  Temp: 97.9 F (36.6 C) 98.1 F (36.7 C)  SpO2: 97% 98%   Vitals:   03/20/19 1915 03/20/19 2116 03/21/19 0336 03/21/19 0744  BP: 138/90 (!) 138/93 133/78 (!) 126/97  Pulse: 69 62 66 74  Resp:  20  18  Temp: 97.7 F (36.5 C)  97.9 F (36.6 C)  98.1 F (36.7 C)  TempSrc: Oral  Oral Oral  SpO2: 98% 99% 97% 98%  Weight:      Height:        General: Pt is alert, awake, not in acute distress Cardiovascular: RRR, S1/S2 +, no rubs, no gallops Respiratory: CTA bilaterally, no wheezing, no rhonchi Abdominal: Soft, NT, ND, bowel sounds + Extremities: no edema, no cyanosis    The results of significant diagnostics from this hospitalization (including imaging, microbiology, ancillary and laboratory) are listed below for reference.     Microbiology: Recent Results (from the past 240 hour(s))  SARS CORONAVIRUS 2 (TAT 6-24 HRS) Nasopharyngeal Nasopharyngeal Swab     Status: None   Collection Time: 03/18/19  4:51 AM   Specimen: Nasopharyngeal Swab  Result Value Ref Range Status   SARS Coronavirus 2 NEGATIVE NEGATIVE Final    Comment: (NOTE) SARS-CoV-2 target nucleic acids are NOT DETECTED. The SARS-CoV-2 RNA is  generally detectable in upper and lower respiratory specimens during the acute phase of infection. Negative results do not preclude SARS-CoV-2 infection, do not rule out co-infections with other pathogens, and should not be used as the sole basis for treatment or other patient management decisions. Negative results must be combined with clinical observations, patient history, and epidemiological information. The expected result is Negative. Fact Sheet for Patients: HairSlick.no Fact Sheet for Healthcare Providers: quierodirigir.com This test is not yet approved or cleared by the [place] FDA and  has been authorized for detection and/or diagnosis of SARS-CoV-2 by FDA under an Emergency Use Authorization (EUA). This EUA will remain  in effect (meaning this test can be used) for the duration of the COVID-19 declaration under Section 56 4(b)(1) of the Act, 21 U.S.C. section 360bbb-3(b)(1), unless the authorization is terminated or revoked sooner. Performed at [facility], [address] [place], [place] [address]   SARS Coronavirus 2 by RT PCR (hospital order, performed in [facility] hospital lab) Nasopharyngeal Nasopharyngeal Swab     Status: None   Collection Time: 03/18/19 10:50 AM   Specimen: Nasopharyngeal Swab  Result Value Ref Range Status   SARS Coronavirus 2 NEGATIVE NEGATIVE Final    Comment: (NOTE) If result is NEGATIVE SARS-CoV-2 target nucleic acids are NOT DETECTED. The SARS-CoV-2 RNA is generally detectable in upper and lower  respiratory specimens during the acute phase of infection. The lowest  concentration of SARS-CoV-2 viral copies this assay can detect is 250  copies / mL. A negative result does not preclude SARS-CoV-2 infection  and should not be used as the sole basis for treatment or other  patient management decisions.  A negative result may occur with  improper specimen collection /  handling, submission of specimen other  than nasopharyngeal swab, presence of viral mutation(s) within the  areas targeted by this assay, and inadequate number of viral copies  (<250 copies / mL). A negative result must be combined with clinical  observations, patient history, and epidemiological information. If result is POSITIVE SARS-CoV-2 target nucleic acids are DETECTED. The SARS-CoV-2 RNA is generally detectable in upper and lower  respiratory specimens dur ing the acute phase of infection.  Positive  results are indicative of active infection with SARS-CoV-2.  Clinical  correlation with patient history and other diagnostic information is  necessary to determine patient infection status.  Positive results do  not rule out bacterial infection or co-infection with other viruses. If result is PRESUMPTIVE POSTIVE SARS-CoV-2 nucleic acids MAY BE PRESENT.   A presumptive positive  result was obtained on the submitted specimen  and confirmed on repeat testing.  While 2019 novel coronavirus  (SARS-CoV-2) nucleic acids may be present in the submitted sample  additional confirmatory testing may be necessary for epidemiological  and / or clinical management purposes  to differentiate between  SARS-CoV-2 and other Sarbecovirus currently known to infect humans.  If clinically indicated additional testing with an alternate test  methodology [phone]) is advised. The SARS-CoV-2 RNA is generally  detectable in upper and lower respiratory sp ecimens during the acute  phase of infection. The expected result is Negative. Fact Sheet for Patients:  BoilerBrush.com.cy Fact Sheet for Healthcare Providers: https://pope.com/ This test is not yet approved or cleared by the [place] FDA and has been authorized for detection and/or diagnosis of SARS-CoV-2 by FDA under an Emergency Use Authorization (EUA).  This EUA will remain in effect (meaning this  test can be used) for the duration of the COVID-19 declaration under Section 564(b)(1) of the Act, 21 U.S.C. section 360bbb-3(b)(1), unless the authorization is terminated or revoked sooner. Performed at [facility], [address] [place], [place] [address]   MRSA PCR Screening     Status: None   Collection Time: 03/18/19  3:38 PM   Specimen: Nasopharyngeal  Result Value Ref Range Status   MRSA by PCR NEGATIVE NEGATIVE Final    Comment:        The GeneXpert MRSA Assay (FDA approved for NASAL specimens only), is one component of a comprehensive MRSA colonization surveillance program. It is not intended to diagnose MRSA infection nor to guide or monitor treatment for MRSA infections. Performed at [facility], [address] [place], [place] [address]      Labs: BNP (last 3 results) No results for input(s): BNP in the last 8760 hours. Basic Metabolic Panel: Recent Labs  Lab 03/18/19 0451 03/19/19 0614 03/21/19 0528  NA 135 137 136  K 3.5 4.1 4.2  CL 102 108 109  CO2 GLUCOSE 110* 126* 88  BUN CREATININE 1.11 1.13 1.09  CALCIUM 8.7* 8.0* 8.3*   Liver Function Tests: Recent Labs  Lab 03/18/19 0451  AST 20  ALT 21  ALKPHOS 54  BILITOT 0.7  PROT 7.0  ALBUMIN 4.0   Recent Labs  Lab 03/18/19 0451  LIPASE 38   No results for input(s): AMMONIA in the last 168 hours. CBC: Recent Labs  Lab 03/18/19 0451 03/19/19 0614 03/21/19 0528  WBC 7.5 6.3 6.8  NEUTROABS 4.5  --   --   HGB 15.2 13.8 14.3  HCT RESULTS UNAVAILABLE DUE TO INTERFERING SUBSTANCE 38.1* 39.0  MCV RESULTS UNAVAILABLE DUE TO INTERFERING SUBSTANCE 81.1 80.6  PLT 248 217 208   Cardiac Enzymes: No results for input(s): CKTOTAL, CKMB, CKMBINDEX, TROPONINI in the last 168 hours. BNP: Invalid input(s): POCBNP CBG: Recent Labs  Lab 03/18/19 1302  GLUCAP 94   D-Dimer No results for input(s): DDIMER in the last 72 hours. Hgb A1c Recent Labs     03/18/19 1330  HGBA1C 5.4   Lipid Profile No results for input(s): CHOL, HDL, LDLCALC, TRIG, CHOLHDL, LDLDIRECT in the last 72 hours. Thyroid function studies No results for input(s): TSH, T4TOTAL, T3FREE, THYROIDAB in the last 72 hours.  Invalid input(s): FREET3 Anemia work up No results for input(s): VITAMINB12, FOLATE, FERRITIN, TIBC, IRON, RETICCTPCT in the last 72 hours. Urinalysis    Component Value Date/Time   COLORURINE YELLOW (A) 08/12/2016 0044  APPEARANCEUR HAZY (A) 08/12/2016 0044   LABSPEC 1.021 08/12/2016 0044   PHURINE 5.0 08/12/2016 0044   GLUCOSEU NEGATIVE 08/12/2016 0044   HGBUR SMALL (A) 08/12/2016 0044   BILIRUBINUR NEGATIVE 08/12/2016 0044   KETONESUR NEGATIVE 08/12/2016 0044   PROTEINUR NEGATIVE 08/12/2016 0044   NITRITE NEGATIVE 08/12/2016 0044   LEUKOCYTESUR MODERATE (A) 08/12/2016 0044   Sepsis Labs Invalid input(s): PROCALCITONIN,  WBC,  LACTICIDVEN Microbiology Recent Results (from the past 240 hour(s))  SARS CORONAVIRUS 2 (TAT 6-24 HRS) Nasopharyngeal Nasopharyngeal Swab     Status: None   Collection Time: 03/18/19  4:51 AM   Specimen: Nasopharyngeal Swab  Result Value Ref Range Status   SARS Coronavirus 2 NEGATIVE NEGATIVE Final    Comment: (NOTE) SARS-CoV-2 target nucleic acids are NOT DETECTED. The SARS-CoV-2 RNA is generally detectable in upper and lower respiratory specimens during the acute phase of infection. Negative results do not preclude SARS-CoV-2 infection, do not rule out co-infections with other pathogens, and should not be used as the sole basis for treatment or other patient management decisions. Negative results must be combined with clinical observations, patient history, and epidemiological information. The expected result is Negative. Fact Sheet for Patients: HairSlick.nohttps://www.fda.gov/media/138098/download Fact Sheet for Healthcare Providers: quierodirigir.comhttps://www.fda.gov/media/138095/download This test is not yet approved or cleared  by the [place] FDA and  has been authorized for detection and/or diagnosis of SARS-CoV-2 by FDA under an Emergency Use Authorization (EUA). This EUA will remain  in effect (meaning this test can be used) for the duration of the COVID-19 declaration under Section 56 4(b)(1) of the Act, 21 U.S.C. section 360bbb-3(b)(1), unless the authorization is terminated or revoked sooner. Performed at [facility], [address] [place], [place] [address]   SARS Coronavirus 2 by RT PCR (hospital order, performed in [facility] hospital lab) Nasopharyngeal Nasopharyngeal Swab     Status: None   Collection Time: 03/18/19 10:50 AM   Specimen: Nasopharyngeal Swab  Result Value Ref Range Status   SARS Coronavirus 2 NEGATIVE NEGATIVE Final    Comment: (NOTE) If result is NEGATIVE SARS-CoV-2 target nucleic acids are NOT DETECTED. The SARS-CoV-2 RNA is generally detectable in upper and lower  respiratory specimens during the acute phase of infection. The lowest  concentration of SARS-CoV-2 viral copies this assay can detect is 250  copies / mL. A negative result does not preclude SARS-CoV-2 infection  and should not be used as the sole basis for treatment or other  patient management decisions.  A negative result may occur with  improper specimen collection / handling, submission of specimen other  than nasopharyngeal swab, presence of viral mutation(s) within the  areas targeted by this assay, and inadequate number of viral copies  (<250 copies / mL). A negative result must be combined with clinical  observations, patient history, and epidemiological information. If result is POSITIVE SARS-CoV-2 target nucleic acids are DETECTED. The SARS-CoV-2 RNA is generally detectable in upper and lower  respiratory specimens dur ing the acute phase of infection.  Positive  results are indicative of active infection with SARS-CoV-2.  Clinical  correlation with patient history and other diagnostic  information is  necessary to determine patient infection status.  Positive results do  not rule out bacterial infection or co-infection with other viruses. If result is PRESUMPTIVE POSTIVE SARS-CoV-2 nucleic acids MAY BE PRESENT.   A presumptive positive result was obtained on the submitted specimen  and confirmed on repeat testing.  While 2019 novel coronavirus  (SARS-CoV-2) nucleic  acids may be present in the submitted sample  additional confirmatory testing may be necessary for epidemiological  and / or clinical management purposes  to differentiate between  SARS-CoV-2 and other Sarbecovirus currently known to infect humans.  If clinically indicated additional testing with an alternate test  methodology [phone]) is advised. The SARS-CoV-2 RNA is generally  detectable in upper and lower respiratory sp ecimens during the acute  phase of infection. The expected result is Negative. Fact Sheet for Patients:  BoilerBrush.com.cy Fact Sheet for Healthcare Providers: https://pope.com/ This test is not yet approved or cleared by the [place] FDA and has been authorized for detection and/or diagnosis of SARS-CoV-2 by FDA under an Emergency Use Authorization (EUA).  This EUA will remain in effect (meaning this test can be used) for the duration of the COVID-19 declaration under Section 564(b)(1) of the Act, 21 U.S.C. section 360bbb-3(b)(1), unless the authorization is terminated or revoked sooner. Performed at [facility], [address] [place], [place] [address]   MRSA PCR Screening     Status: None   Collection Time: 03/18/19  3:38 PM   Specimen: Nasopharyngeal  Result Value Ref Range Status   MRSA by PCR NEGATIVE NEGATIVE Final    Comment:        The GeneXpert MRSA Assay (FDA approved for NASAL specimens only), is one component of a comprehensive MRSA colonization surveillance program. It is not intended to diagnose  MRSA infection nor to guide or monitor treatment for MRSA infections. Performed at [facility], [address] [place], [place] [address]      Time coordinating discharge: Over 30 minutes  SIGNED:   Burke Keels, MD  [facility] 03/21/2019, 11:42 AM Pager   If 7PM-7AM, please contact night-coverage [email or web address] Password [email or web address]

## 2019-03-21 NOTE — Progress Notes (Signed)
Cardiovascular and Pulmonary Nurse Navigator Note:    53 year old [place] American male with known history of HTN who presented to the ER with chest pain.  Patient ruled in for NSTEMI.  On 03/18/2019 patient underwent Cardiac Cath.    Iran Ouch, MD (Primary)    Procedures  Coronary/Graft Acute MI Revascularization  CORONARY STENT INTERVENTION  LEFT HEART CATH AND CORONARY ANGIOGRAPHY  Conclusion    The left ventricular systolic function is normal.  LV end diastolic pressure is normal.  The left ventricular ejection fraction is 55-65% by visual estimate.  Mid LAD lesion is 80% stenosed.  Prox RCA lesion is 30% stenosed.  Dist RCA lesion is 100% stenosed.  Post intervention, there is a 0% residual stenosis.  A drug-eluting stent was successfully placed using a STENT RESOLUTE ONYX 2.25X22.  RPDA lesion is 40% stenosed.   1.  Significant two-vessel coronary artery disease.  The culprit is an occluded distal right coronary artery with faint left-to-right collaterals.  The supplied area is not large and that is the likely reason for nondiagnostic EKG changes for inferior STEMI.  There is also significant mid LAD stenosis which appears to be hazy. 2.  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  Significantly elevated LVEDP in the low 30s. 3.  Successful angioplasty and drug-eluting stent placement to the distal RCA.  Recommendations: Continue dual antiplatelet therapy for at least 1 year.  Aggressive treatment of risk factors. Recommend staged PCI of the LAD before hospital discharge.  This is planned for Monday morning.    03/20/2019:   CORONARY STENT INTERVENTION  Conclusion    RPDA lesion is 40% stenosed.  Prox RCA lesion is 30% stenosed.  Previously placed Dist RCA drug eluting stent is widely patent.  Balloon angioplasty was  performed.  Mid LAD lesion is 80% stenosed.  Post intervention, there is a 0% residual stenosis.  A drug-eluting stent was successfully placed using a STENT RESOLUTE ONYX 3.0X15.   Successful angioplasty and drug-eluting stent placement to the mid LAD.  Recommendations: Continue dual antiplatelet therapy for at least 1 year. Aggressive treatment of risk factors. Likely discharge home tomorrow. He is to stay off work for 2 weeks.     EDUCATION:   "Heart Attack Bouncing Back" booklet given and reviewed with patient. Discussed the definition of CAD. Reviewed the location of CAD and where his stents were placed. Informed patient he will be given stents card. Explained the purpose of the stent card. Instructed patient to keep stent card in his wallet.  ? Discussed modifiable risk factors including controlling blood pressure, cholesterol, and blood sugar; following heart healthy diet; maintaining healthy weight; exercise; and smoking cessation.   ? Discussed cardiac medications including rationale for taking, mechanisms of action, and side effects. Stressed the importance of taking medications as prescribed.  ? Discussed emergency plan for heart attack symptoms. Patient verbalized understanding of need to call 911 and not to drive himself to ER if having cardiac symptoms / chest pain.     ? Diet of low sodium, low fat, low cholesterol heart healthy modified diet discussed.  Patient requesting a list of foods he should avoid as well as a sample menu.  Information on diet provided. Questions answered.   ? Smoking Cessation - Patient is a CURRENT every day smoker. Patient has been advised to quit by his  cardiologist. Patient smokes one pack per  day or every two days.   "Thinking about Quitting - Yes You Can!" informational sheet reviewed with patient.  Patient informed this RN that he he has attempted to quit once before but was unsuccessful.  Discussed tapering down, brand switching and  things to do when he is having a craving (walk, chew on straws, sour balls, etc.)   ? Exercise - Benefits of exercised discussed. 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  He works maybe 3 x / week.   Informed patient his  cardiologist has referred him  to outpatient Cardiac Rehab. An overview of the program was provided. Brochure, informational letter with CPT billing codes given to patient. Barriers to patient participating:  Patient does not drive.  Patient has Medicare A and B with no supplement.  Patient is concerned about co-pay or out-of-pocket expenses for Cardiac Rehab, as he needs any money he has for his medications.  Patient informed of transportation options paid for by [facility] Cardiac Rehab department.  Patient stated, "I need to think about it. I need to see how much my medication is going to cost and just think about my other expenses."  Patient agreeable to be contacted in one to two weeks after discharge.    Patient appreciative of the information.  ? Roanna Epley, RN, BSN, [place]  [facility] Cardiac & Pulmonary Rehab  Cardiovascular & Pulmonary Nurse Navigator  Direct Line: [phone]  Department Phone #: [phone] Fax: [phone]  Email Address: [email or web address]

## 2019-03-21 NOTE — Progress Notes (Signed)
Progress Note  Patient Name: Jeff Rios Date of Encounter: 03/21/2019  Primary Cardiologist: New - Arida  Subjective   No chest pain or shortness of breath.  Mild soreness in right wrist and base of thumb.  Inpatient Medications    Scheduled Meds: . aspirin EC  81 mg Oral Daily  . atorvastatin  80 mg Oral q1800  . carvedilol  3.125 mg Oral BID WC  . Chlorhexidine Gluconate Cloth  6 each Topical Daily  . enoxaparin (LOVENOX) injection  40 mg Subcutaneous Q24H  . prasugrel  10 mg Oral Daily  . sodium chloride flush  3 mL Intravenous Q12H  . sodium chloride flush  3 mL Intravenous Q12H   Continuous Infusions: . sodium chloride     PRN Meds: sodium chloride, acetaminophen, ALPRAZolam, morphine injection, nitroGLYCERIN, ondansetron (ZOFRAN) IV, sodium chloride flush, zolpidem   Vital Signs    Vitals:   03/20/19 1915 03/20/19 2116 03/21/19 0336 03/21/19 0744  BP: 138/90 (!) 138/93 133/78 (!) 126/97  Pulse: 69 62 66 74  Resp:  20  18  Temp: 97.7 F (36.5 C)  97.9 F (36.6 C) 98.1 F (36.7 C)  TempSrc: Oral  Oral Oral  SpO2: 98% 99% 97% 98%  Weight:      Height:        Intake/Output Summary (Last 24 hours) at 03/21/2019 0922 Last data filed at 03/21/2019 0354 Gross per 24 hour  Intake 443.78 ml  Output 0 ml  Net 443.78 ml   Last 3 Weights 03/18/2019 03/18/2019 03/18/2019  Weight (lbs) 188 lb 7.9 oz 179 lb 4.8 oz 160 lb  Weight (kg) 85.5 kg 81.33 kg 72.576 kg      Telemetry    Sinus rhythm - Personally Reviewed  ECG    NSR with non-specific T wave changes - Personally Reviewed  Physical Exam   GEN: No acute distress.   Neck: No JVD Cardiac: RRR, no murmurs, rubs, or gallops.  Respiratory: Clear to auscultation bilaterally. GI: Soft, nontender, non-distended  MS: No edema; No deformity.  Right radial cath site with mild swelling.  2+ right radial pulse.  Normal capillary refill. Neuro:  Nonfocal  Psych: Normal affect   Labs    High  Sensitivity Troponin:   Recent Labs  Lab 03/18/19 0451 03/18/19 0701 03/18/19 0953 03/18/19 1511  TROPONINIHS 59* 210* 876* 24,643*      Chemistry Recent Labs  Lab 03/18/19 0451 03/19/19 0614 03/21/19 0528  NA 135 137 136  K 3.5 4.1 4.2  CL 102 108 109  CO2 25 24 23   GLUCOSE 110* 126* 88  BUN 16 10 13   CREATININE 1.11 1.13 1.09  CALCIUM 8.7* 8.0* 8.3*  PROT 7.0  --   --   ALBUMIN 4.0  --   --   AST 20  --   --   ALT 21  --   --   ALKPHOS 54  --   --   BILITOT 0.7  --   --   GFRNONAA >60 >60 >60  GFRAA >60 >60 >60  ANIONGAP 8 5 4*     Hematology Recent Labs  Lab 03/18/19 0451 03/19/19 0614 03/21/19 0528  WBC 7.5 6.3 6.8  RBC RESULTS UNAVAILABLE DUE TO INTERFERING SUBSTANCE 4.70 4.84  HGB 15.2 13.8 14.3  HCT RESULTS UNAVAILABLE DUE TO INTERFERING SUBSTANCE 38.1* 39.0  MCV RESULTS UNAVAILABLE DUE TO INTERFERING SUBSTANCE 81.1 80.6  MCH RESULTS UNAVAILABLE DUE TO INTERFERING SUBSTANCE 29.4 29.5  MCHC RESULTS UNAVAILABLE  DUE TO INTERFERING SUBSTANCE 36.2* 36.7*  RDW RESULTS UNAVAILABLE DUE TO INTERFERING SUBSTANCE 13.3 13.2  PLT 248 217 208    BNPNo results for input(s): BNP, PROBNP in the last 168 hours.   DDimer No results for input(s): DDIMER in the last 168 hours.   Radiology    No results found.  Cardiac Studies   LHC/PCI (03/08/2019): 1.  Significant two-vessel coronary artery disease.  The culprit is an occluded distal right coronary artery with faint left-to-right collaterals.  The supplied area is not large and that is the likely reason for nondiagnostic EKG changes for inferior STEMI.  There is also significant mid LAD stenosis which appears to be hazy. 2.  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  Significantly elevated LVEDP in the low 30s. 3.  Successful angioplasty and drug-eluting stent placement to the distal RCA.  PCI  (03/20/2019): Successful angioplasty and drug-eluting stent placement to the mid LAD.  TTE (03/18/2019):  1. Left ventricular ejection fraction, by visual estimation, is 60 to 65%. The left ventricle has normal function. There is mildly increased left ventricular hypertrophy.  2. Left ventricular diastolic parameters are consistent with Grade I diastolic dysfunction (impaired relaxation).  3. Global right ventricle has normal systolic function.The right ventricular size is normal. No increase in right ventricular wall thickness.  4. The mitral valve is normal in structure. No evidence of mitral valve regurgitation. No evidence of mitral stenosis.  5. The tricuspid valve is normal in structure. Tricuspid valve regurgitation is not demonstrated.  6. The aortic valve is normal in structure. Aortic valve regurgitation is not visualized. No evidence of aortic valve sclerosis or stenosis.  7. The pulmonic valve was normal in structure. Pulmonic valve regurgitation is trivial.  8. TR signal is inadequate for assessing pulmonary artery systolic pressure.  9. The inferior vena cava is normal in size with greater than 50% respiratory variability, suggesting right atrial pressure of 3 mmHg.  Patient Profile     53 y.o. male man with history of HTN and tobacco abuse, admitted with NSTEMI.  Assessment & Plan    NSTEMI: Pt s/p urgent LHC/PCI to distal RCA on 11/14 and staged PCI to mid LAD yesterday.  He is CP-free.  DAPT with ASA and prasugrel for at least 12 months.  Recommend case management assistance to ensure that the patient is able to afford his medication (importance of adherence reinforced).  Continue atorvastatin 80 mg daily and carvedilol 3.125 mg BID.  Tobacco cessation  Outpatient cardiac rehab.  Hypertension: BP borderline with DBP 90-110 bpm.  Continue carvedilol 3.125 mg BID.  Further medication titration on outpatient basis.  [facility] will sign off.  Patient is stable  for discharge home from a cardiac standpoint Medication Recommendations:  See above. Other recommendations (labs, testing, etc):  None. Follow up as an outpatient:  F/u with Dr. Kirke Corin or APP in ~2 weeks (our office has been contacted to schedule appointment).  For questions or updates, please contact [facility] Please consult [email or web address] for contact info under [facility].  Signed, Yvonne Kendall, MD  03/21/2019, 9:22 AM

## 2019-03-22 NOTE — Telephone Encounter (Signed)
TOC-Awaiting scheduling. 

## 2019-03-22 NOTE — Telephone Encounter (Signed)
Attempted to schedule.  No ans no vm  

## 2019-03-24 NOTE — Telephone Encounter (Signed)
Call to patient to discuss TCM and f/u appt. Pt had a had time verbalizing needs/plan of care. I asked him if I could call next of kin. He verbalized that Naaman Plummer was next of kin and it would okay to call her for further assistance.   Call to [facility], left voicemail since there was no answer.

## 2019-03-26 ENCOUNTER — Other Ambulatory Visit: Payer: Self-pay

## 2019-03-26 ENCOUNTER — Emergency Department
Admission: EM | Admit: 2019-03-26 | Discharge: 2019-03-27 | Disposition: A | Discharge status: Home / Self Care | Payer: Medicare Other | Attending: Emergency Medicine | Admitting: Emergency Medicine

## 2019-03-26 DIAGNOSIS — T148XXA Other injury of unspecified body region, initial encounter: Secondary | ICD-10-CM

## 2019-03-26 DIAGNOSIS — Y999 Unspecified external cause status: Secondary | ICD-10-CM | POA: Diagnosis not present

## 2019-03-26 DIAGNOSIS — F17210 Nicotine dependence, cigarettes, uncomplicated: Secondary | ICD-10-CM | POA: Insufficient documentation

## 2019-03-26 DIAGNOSIS — Y929 Unspecified place or not applicable: Secondary | ICD-10-CM | POA: Insufficient documentation

## 2019-03-26 DIAGNOSIS — Y939 Activity, unspecified: Secondary | ICD-10-CM | POA: Insufficient documentation

## 2019-03-26 DIAGNOSIS — X58XXXA Exposure to other specified factors, initial encounter: Secondary | ICD-10-CM | POA: Insufficient documentation

## 2019-03-26 DIAGNOSIS — Z7982 Long term (current) use of aspirin: Secondary | ICD-10-CM | POA: Diagnosis not present

## 2019-03-26 DIAGNOSIS — S301XXA Contusion of abdominal wall, initial encounter: Secondary | ICD-10-CM | POA: Diagnosis present

## 2019-03-26 DIAGNOSIS — Z79899 Other long term (current) drug therapy: Secondary | ICD-10-CM | POA: Diagnosis not present

## 2019-03-26 LAB — APTT: aPTT: 29 seconds (ref 24–36)

## 2019-03-26 LAB — BASIC METABOLIC PANEL
Anion gap: 8 (ref 5–15)
BUN: 15 mg/dL (ref 6–20)
CO2: 23 mmol/L (ref 22–32)
Calcium: 9 mg/dL (ref 8.9–10.3)
Chloride: 108 mmol/L (ref 98–111)
Creatinine, Ser: 1.11 mg/dL (ref 0.61–1.24)
GFR calc Af Amer: >60 mL/min (ref >60)
GFR calc non Af Amer: >60 mL/min (ref >60)
Glucose, Bld: 99 mg/dL (ref 70–99)
Potassium: 4.4 mmol/L (ref 3.5–5.1)
Sodium: 139 mmol/L (ref 135–145)

## 2019-03-26 LAB — CBC
HCT: 40.7 % (ref 39.0–52.0)
Hemoglobin: 15.2 g/dL (ref 13.0–17.0)
MCH: 30.2 pg (ref 26.0–34.0)
MCHC: 37.3 g/dL — ABNORMAL HIGH (ref 30.0–36.0)
MCV: 80.8 fL (ref 80.0–100.0)
Platelets: 262 10*3/uL (ref 150–400)
RBC: 5.04 MIL/uL (ref 4.22–5.81)
RDW: 13.2 % (ref 11.5–15.5)
WBC: 5.6 10*3/uL (ref 4.0–10.5)
nRBC: 0 % (ref 0.0–0.2)

## 2019-03-26 LAB — PROTIME-INR
INR: 1 (ref 0.8–1.2)
Prothrombin Time: 13 seconds (ref 11.4–15.2)

## 2019-03-26 NOTE — ED Triage Notes (Addendum)
Pt c/o bruising to right side of abdomen and right wrist. Pt had heart cath done recently which went through right wrist.  Pt also states he noticed today bruising to the left side of his abdomen around belly button.   Pt states he is now on blood thinners. Pt denies receiving any shots while in the hospital in his stomach.   Heart rate initially was in the 140s on arrival. But dropped down into the 80s after sitting for a few minutes.

## 2019-03-26 NOTE — ED Notes (Signed)
Pt had heart cath performed through R wrist, covered with bandage. C/o of RLQ bruising. Began blood thinners after procedure. A&O, speaking in complete sentences. No distress noted.

## 2019-03-26 NOTE — ED Provider Notes (Signed)
[facility] Emergency Department Provider Note  ____________________________________________   I have reviewed the triage vital signs and the nursing notes.   HISTORY  Chief Complaint Bleeding/Bruising (Abdomen)   History limited by: Not Limited   HPI Jeff Rios is a 53 y.o. male who presents to the emergency department today because of bruising noted to his right abdomen. He noticed the bruising this morning. Does not recall any trauma to that area. Denies any pain. Denies any weakness or shortness of breath. Had a recent hospitalization for chest pain and had catheterization performed. Was started on blood thinners. Also complaining of a small amount of bruising to the right wrist.  Records reviewed. Per medical record review patient has a history of hypertension.   Past Medical History:  Diagnosis Date  . Hypertension     Patient Active Problem List   Diagnosis Date Noted  . HTN (hypertension) 03/19/2019  . HLD (hyperlipidemia) 03/19/2019  . Chest pain 03/18/2019  . ACS (acute coronary syndrome) (HCC) 03/18/2019  . Non-ST elevation (NSTEMI) myocardial infarction [facility])     Past Surgical History:  Procedure Laterality Date  . CORONARY STENT INTERVENTION N/A 03/18/2019   Procedure: CORONARY STENT INTERVENTION;  Surgeon: Iran Ouch, MD;  Location: [facility] INVASIVE CV LAB;  Service: Cardiovascular;  Laterality: N/A;  . CORONARY STENT INTERVENTION N/A 03/20/2019   Procedure: CORONARY STENT INTERVENTION;  Surgeon: Iran Ouch, MD;  Location: [facility] INVASIVE CV LAB;  Service: Cardiovascular;  Laterality: N/A;  . CORONARY/GRAFT ACUTE MI REVASCULARIZATION N/A 03/18/2019   Procedure: Coronary/Graft Acute MI Revascularization;  Surgeon: Iran Ouch, MD;  Location: [facility] INVASIVE CV LAB;  Service: Cardiovascular;  Laterality: N/A;  . LEFT HEART CATH AND CORONARY ANGIOGRAPHY N/A 03/18/2019   Procedure: LEFT HEART CATH AND CORONARY  ANGIOGRAPHY;  Surgeon: Iran Ouch, MD;  Location: [facility] INVASIVE CV LAB;  Service: Cardiovascular;  Laterality: N/A;    Prior to Admission medications   Medication Sig Start Date End Date Taking? Authorizing Provider  aspirin 81 MG EC tablet Take 1 tablet (81 mg total) by mouth daily. 03/22/19 03/16/20  Marzetta Board, MD  atorvastatin (LIPITOR) 80 MG tablet Take 1 tablet (80 mg total) by mouth daily at 6 PM. 03/21/19 03/15/20  Spongberg, Susy Frizzle, MD  carvedilol (COREG) 3.125 MG tablet Take 1 tablet (3.125 mg total) by mouth 2 (two) times daily with a meal. 03/21/19 03/15/20  Spongberg, Susy Frizzle, MD  nitroGLYCERIN (NITROSTAT) 0.4 MG SL tablet Place 1 tablet (0.4 mg total) under the tongue every 5 (five) minutes as needed for chest pain. 03/21/19 04/20/19  Marzetta Board, MD  prasugrel (EFFIENT) 10 MG TABS tablet Take 1 tablet (10 mg total) by mouth daily. 03/22/19 03/16/20  Spongberg, Susy Frizzle, MD  hydrochlorothiazide (HYDRODIURIL) 12.5 MG tablet Take 1 tablet (12.5 mg total) by mouth daily. 03/05/19 03/05/19  Concha Se, MD    Allergies Amlodipine  No family history on file.  Social History Social History   Tobacco Use  . Smoking status: Current Every Day Smoker    Packs/day: 1.00    Types: Cigarettes  . Smokeless tobacco: Never Used  Substance Use Topics  . Alcohol use: No  . Drug use: No    Review of Systems Constitutional: No fever/chills Eyes: No visual changes. ENT: No sore throat. Cardiovascular: Denies chest pain. Respiratory: Denies shortness of breath. Gastrointestinal: No abdominal pain.  No nausea, no vomiting.  No diarrhea.   Genitourinary: Negative for dysuria. Musculoskeletal:  Negative for back pain. Skin: Positive for bruising to the right abdomen. Some bruising to the right wrist.  Neurological: Negative for headaches, focal weakness or numbness.  ____________________________________________   PHYSICAL  EXAM:  VITAL SIGNS: ED Triage Vitals  Enc Vitals Group     BP 03/26/19 1348 122/86     Pulse Rate 03/26/19 1348 80     Resp 03/26/19 1348 16     Temp 03/26/19 1348 98.5 F (36.9 C)     Temp Source 03/26/19 1348 Oral     SpO2 03/26/19 1348 95 %     Weight 03/26/19 1358 179 lb (81.2 kg)     Height 03/26/19 1358 5\' 3"  (1.6 m)     Head Circumference --      Peak Flow --      Pain Score 03/26/19 1358 0   Constitutional: Alert and oriented.  Eyes: Conjunctivae are normal.  ENT      Head: Normocephalic and atraumatic.      Nose: No congestion/rhinnorhea.      Mouth/Throat: Mucous membranes are moist.      Neck: No stridor. Hematological/Lymphatic/Immunilogical: No cervical lymphadenopathy. Cardiovascular: Normal rate, regular rhythm.  No murmurs, rubs, or gallops.  Respiratory: Normal respiratory effort without tachypnea nor retractions. Breath sounds are clear and equal bilaterally. No wheezes/rales/rhonchi. Gastrointestinal: Soft and non tender. No rebound. No guarding.  Genitourinary: Deferred Musculoskeletal: Normal range of motion in all extremities. No lower extremity edema. Neurologic:  Normal speech and language. No gross focal neurologic deficits are appreciated.  Skin:  Small bruise noted to right wrist. Bruise noted to right abdomen. Psychiatric: Mood and affect are normal. Speech and behavior are normal. Patient exhibits appropriate insight and judgment.  ____________________________________________    LABS (pertinent positives/negatives)  BMP wnl CBC wbc 5.6, hgb 15.2, plt 262  ____________________________________________   EKG  I, Nance Pear, attending physician, personally viewed and interpreted this EKG  EKG Time: 1352 Rate: 75 Rhythm: normal sinus rhythm Axis: normal Intervals: qtc 406 QRS: narrow ST changes: no st elevation, t wave inversion II, III, aVF, V5, V6 Impression: abnormal ekg  ____________________________________________     RADIOLOGY  None  ____________________________________________   PROCEDURES  Procedures  ____________________________________________   INITIAL IMPRESSION / ASSESSMENT AND PLAN / ED COURSE  Pertinent labs & imaging results that were available during my care of the patient were reviewed by me and considered in my medical decision making (see chart for details).   Patient presented to the emergency department tonight because of concern for bruising that he noticed this morning in his right lower abdomen. Denies any associated pain. Denies any shortness of breath or weakness. On exam does have a small bruise to that area. At this point I doubt significant intraabdominal or retroperitoneal bleed. Would think it more likely bruising related to medication administration during recent hospitalization. Did discuss possibility of obtaining CT scan with patient however he felt comfortable deferring which I think is reasonable at this time.  ____________________________________________   FINAL CLINICAL IMPRESSION(S) / ED DIAGNOSES  Final diagnoses:  Bruising     Note: This dictation was prepared with Dragon dictation. Any transcriptional errors that result from this process are unintentional     Nance Pear, MD 03/26/19 2338

## 2019-03-27 NOTE — Telephone Encounter (Signed)
Patient contacted regarding discharge from [facility] on 03/21/19.  Patient was not scheduled for f/u appointment yet. Scheduled for the following.  Patient understands to follow up with provider Christell Faith, PA on 04/04/19 at 08:30 am at [facility]. Patient understands discharge instructions? yes Patient understands medications and regiment? yes Patient understands to bring all medications to this visit? Yes  Patient is concerned because he does not have a ride. He will check to get a ride from a church member or his sister. In the meantime, I called [facility]. I arranged for [facility] to pick up patient and bring him to his appointment on 04/04/19 at 0830 and to arrive around 0810 am.  Called patient back and he verbalized understanding of this and was appreciative.

## 2019-03-27 NOTE — Discharge Instructions (Signed)
Please seek medical attention for any high fevers, chest pain, shortness of breath, change in behavior, persistent vomiting, bloody stool or any other new or concerning symptoms.  

## 2019-03-28 ENCOUNTER — Ambulatory Visit: Payer: Medicare Other | Admitting: Cardiovascular Disease

## 2019-03-29 ENCOUNTER — Encounter: Payer: Self-pay | Admitting: Physician Assistant

## 2019-03-29 NOTE — Progress Notes (Signed)
Cardiology Office Note    Date:  04/04/2019   ID:  Jeff Rios, DOB Jan 17, 1966, MRN [id number]  PCP:  Patient, [name]  Cardiologist:  Lorine Bears, MD  Electrophysiologist:  None   Chief Complaint: Hospital follow up  History of Present Illness:   Jeff Rios is a 53 y.o. male with history of CAD with recent non-STEMI on 05/17/2018 status post PCI as outlined below, HTN, HLD, and tobacco abuse who presents for hospital follow-up as detailed below.  Prior to his hospital admission in mid 03/2019 he had no previously known cardiac history.  He had recently been seen in the ED on 03/05/2019 with idiopathic angioedema and was treated with steroids.  There was some concern this may have been related to his amlodipine leading to its discontinuation and initiation of metoprolol.  He presented to the hospital on 03/18/2019 with severe substernal chest discomfort that woke him up from sleep.  EKG showed sinus rhythm with less than 1 mm ST elevation in lead III with no associated ST elevation in other leads with anterolateral T wave inversion and minor ST depression in leads I and aVL.  Initial high-sensitivity troponin was 59 with a delta of 210, subsequently peaking at 24,643.  Patient continued to note 10 out of 10 chest pain and in this setting he underwent urgent LHC which showed significant two-vessel CAD with the culprit being an occluded distal RCA with faint left to right collaterals.  The supplied area was not large and this was felt to be the likely reason for nondiagnostic EKG changes for inferior STEMI.  There was also significant mid LAD stenosis which appeared to be hazy.  LV systolic function with a spade-like appearance of the LV and mid cavity gradient suggestive of variant of hypertrophic cardiomyopathy with significantly elevated LVEDP in the low 30s.  Patient underwent successful PCI/DES to the distal RCA.  Please see remaining cath details as outlined below.  Echo showed  an EF of 60 to 65%, mild LVH, grade 1 diastolic dysfunction, normal RV systolic function and cavity size, no significant valvular abnormality.  He underwent staged PCI to the mid LAD on 03/20/2019.  He was subsequently seen in the ED on 03/26/2019 with abdominal bruising at the site of his Lovenox/heparin injections.  No imaging was obtained.  EKG showed sinus rhythm with inferolateral T wave inversion.  Labs showed a stable CBC as outlined below.  No further work-up was recommended.  He comes in doing well from a cardiac perspective.  He has not had any further chest pain.  He does note some intermittent brief episodes of shortness of breath that are described as having to take an isolated deep inhalation.  He denies any exertional symptoms.  No dizziness, presyncope, syncope.  No lower extremity swelling, abdominal distention, orthopnea, PND, or early satiety.  He denies any falls, BRBPR, or melena.  He reports complete compliance with all medications including dual antiplatelet therapy without missing any doses.  He notes the abdominal and right forearm bruising are resolving.  No issues from right radial cath site.  He denies any further tobacco use.   Labs: 03/2019 - potassium 4.4, BUN 15, serum creatinine 1.11, Hgb 15.2, PLT 262, A1c 5.4, total cholesterol 164, triglyceride 32, HDL 42, LDL 116, albumin 4.0, AST/ALT normal  Past Medical History:  Diagnosis Date   CAD (coronary artery disease)    HLD (hyperlipidemia)    Hypertension     Past Surgical History:  Procedure Laterality  Date   CORONARY STENT INTERVENTION N/A 03/18/2019   Procedure: CORONARY STENT INTERVENTION;  Surgeon: Iran OuchArida, Muhammad A, MD;  Location: [facility] INVASIVE CV [facility];  Service: Cardiovascular;  Laterality: N/A;   CORONARY STENT INTERVENTION N/A 03/20/2019   Procedure: CORONARY STENT INTERVENTION;  Surgeon: Iran OuchArida, Muhammad A, MD;  Location: [facility]AS[facility] CV [facility];  Service: Cardiovascular;  Laterality: N/A;    CORONARY/GRAFT ACUTE MI REVASCULARIZATION N/A 03/18/2019   Procedure: Coronary/Graft Acute MI Revascularization;  Surgeon: Iran OuchArida, Muhammad A, MD;  Location: [facility] CV [facility];  Service: Cardiovascular;  Laterality: N/A;   LEFT HEART CATH AND CORONARY ANGIOGRAPHY N/A 03/18/2019   Procedure: LEFT HEART CATH AND CORONARY ANGIOGRAPHY;  Surgeon: Iran OuchArida, Muhammad A, MD;  Location: [facility] INVASIVE CV [facility];  Service: Cardiovascular;  Laterality: N/A;    Current Medications: Current Meds  Medication Sig   aspirin 81 MG EC tablet Take 1 tablet (81 mg total) by mouth daily.   atorvastatin (LIPITOR) 80 MG tablet Take 1 tablet (80 mg total) by mouth daily at 6 PM.   carvedilol (COREG) 3.125 MG tablet Take 1 tablet (3.125 mg total) by mouth 2 (two) times daily with a meal.   nitroGLYCERIN (NITROSTAT) 0.4 MG SL tablet Place 1 tablet (0.4 mg total) under the tongue every 5 (five) minutes as needed for chest pain.   prasugrel (EFFIENT) 10 MG TABS tablet Take 1 tablet (10 mg total) by mouth daily.    Allergies:   Amlodipine   Social History   Socioeconomic History   Marital status: Single    Spouse name: Not on file   Number of children: Not on file   Years of education: Not on file   Highest education level: Not on file  Occupational History   Not on file  Social Needs   Financial resource strain: Not on file   Food insecurity    Worry: Not on file    Inability: Not on file   Transportation needs    Medical: Not on file    Non-medical: Not on file  Tobacco Use   Smoking status: Current Every Day Smoker    Packs/day: 1.00    Types: Cigarettes   Smokeless tobacco: Never Used  Substance and Sexual Activity   Alcohol use: No   Drug use: No   Sexual activity: Not on file  Lifestyle   Physical activity    Days per week: Not on file    Minutes per session: Not on file   Stress: Not on file  Relationships   Social connections    Talks on phone: Not on file    Gets  together: Not on file    Attends religious service: Not on file    Active member of club or organization: Not on file    Attends meetings of clubs or organizations: Not on file    Relationship status: Not on file  Other Topics Concern   Not on file  Social History Narrative   Not on file     Family History:  The patient's family history is not on file.  ROS:   Review of Systems  Constitutional: Positive for malaise/fatigue. Negative for chills, diaphoresis, fever and weight loss.  HENT: Negative for congestion.   Eyes: Negative for discharge and redness.  Respiratory: Positive for shortness of breath. Negative for cough, hemoptysis, sputum production and wheezing.   Cardiovascular: Negative for chest pain, palpitations, orthopnea, claudication, leg swelling and PND.  Gastrointestinal: Negative for abdominal pain, blood in stool, heartburn,  melena, nausea and vomiting.  Genitourinary: Negative for hematuria.  Musculoskeletal: Negative for falls and myalgias.  Skin: Negative for rash.  Neurological: Negative for dizziness, tingling, tremors, sensory change, speech change, focal weakness, loss of consciousness and weakness.  Endo/Heme/Allergies: Bruises/bleeds easily.  Psychiatric/Behavioral: Negative for substance abuse. The patient is not nervous/anxious.   All other systems reviewed and are negative.    EKGs/Labs/Other Studies Reviewed:    Studies reviewed were summarized above. The additional studies were reviewed today: LHC 2019/03/26:  The left ventricular systolic function is normal.  LV end diastolic pressure is normal.  The left ventricular ejection fraction is 55-65% by visual estimate.  Mid LAD lesion is 80% stenosed.  Prox RCA lesion is 30% stenosed.  Dist RCA lesion is 100% stenosed.  Post intervention, there is a 0% residual stenosis.  A drug-eluting stent was successfully placed using a STENT RESOLUTE ONYX 2.25X22.  RPDA lesion is 40% stenosed.     1.  Significant two-vessel coronary artery disease.  The culprit is an occluded distal right coronary artery with faint left-to-right collaterals.  The supplied area is not large and that is the likely reason for nondiagnostic EKG changes for inferior STEMI.  There is also significant mid LAD stenosis which appears to be hazy. 2.  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  Significantly elevated LVEDP in the low 30s. 3.  Successful angioplasty and drug-eluting stent placement to the distal RCA.  Recommendations: Continue dual antiplatelet therapy for at least 1 year.  Aggressive treatment of risk factors. Recommend staged PCI of the LAD before hospital discharge.  This is planned for Monday morning. __________  2D Echo 03/26/2019:  1. Left ventricular ejection fraction, by visual estimation, is 60 to 65%. The left ventricle has normal function. There is mildly increased left ventricular hypertrophy.  2. Left ventricular diastolic parameters are consistent with Grade I diastolic dysfunction (impaired relaxation).  3. Global right ventricle has normal systolic function.The right ventricular size is normal. No increase in right ventricular wall thickness.  4. The mitral valve is normal in structure. No evidence of mitral valve regurgitation. No evidence of mitral stenosis.  5. The tricuspid valve is normal in structure. Tricuspid valve regurgitation is not demonstrated.  6. The aortic valve is normal in structure. Aortic valve regurgitation is not visualized. No evidence of aortic valve sclerosis or stenosis.  7. The pulmonic valve was normal in structure. Pulmonic valve regurgitation is trivial.  8. TR signal is inadequate for assessing pulmonary artery systolic pressure.  9. The inferior vena cava is normal in size with greater than 50% respiratory variability, suggesting right  atrial pressure of 3 mmHg. __________  PCI 03/20/2019:  RPDA lesion is 40% stenosed.  Prox RCA lesion is 30% stenosed.  Previously placed Dist RCA drug eluting stent is widely patent.  Balloon angioplasty was performed.  Mid LAD lesion is 80% stenosed.  Post intervention, there is a 0% residual stenosis.  A drug-eluting stent was successfully placed using a STENT RESOLUTE ONYX 3.0X15.   Successful angioplasty and drug-eluting stent placement to the mid LAD.  Recommendations: Continue dual antiplatelet therapy for at least 1 year. Aggressive treatment of risk factors. Likely discharge home tomorrow. He is to stay off work for 2 weeks.   EKG:  EKG is ordered today.  The EKG ordered today demonstrates NSR, 65 bpm, possible left atrial enlargement, nonspecific lateral T wave inversion consistent  with prior MI, no acute ST-T changes  Recent Labs: 03/18/2019: ALT 21 03/26/2019: BUN 15; Creatinine, Ser 1.11; Hemoglobin 15.2; Platelets 262; Potassium 4.4; Sodium 139  Recent Lipid Panel    Component Value Date/Time   CHOL 164 03/18/2019 0701   TRIG 32 03/18/2019 0701   HDL 42 03/18/2019 0701   CHOLHDL 3.9 03/18/2019 0701   VLDL 6 03/18/2019 0701   LDLCALC 116 (H) 03/18/2019 0701    PHYSICAL EXAM:    VS:  BP 140/82 (BP Location: Left Arm, Patient Position: Sitting, Cuff Size: Normal)    Pulse 65    Ht 5\' 3"  (1.6 m)    Wt 181 lb (82.1 kg)    BMI 32.06 kg/m   BMI: Body mass index is 32.06 kg/m.  Physical Exam  Constitutional: He is oriented to person, place, and time. He appears well-developed and well-nourished.  HENT:  Head: Normocephalic and atraumatic.  Eyes: Right eye exhibits no discharge. Left eye exhibits no discharge.  Neck: Normal range of motion. No JVD present.  Cardiovascular: Normal rate, regular rhythm, S1 normal, S2 normal and normal heart sounds. Exam reveals no distant heart sounds, no friction rub, no midsystolic click and no opening snap.  No murmur  heard. Pulses:      Posterior tibial pulses are 2+ on the right side and 2+ on the left side.  Well-healing right radial cardiac cath site with 2+ radial pulse.  Resolving ecchymosis along the right forearm.  Pulmonary/Chest: Effort normal and breath sounds normal. No respiratory distress. He has no decreased breath sounds. He has no wheezes. He has no rales. He exhibits no tenderness.  Abdominal: Soft. He exhibits no distension. There is no abdominal tenderness.  Resolving periumbilical ecchymosis.  Musculoskeletal:        General: No edema.  Neurological: He is alert and oriented to person, place, and time.  Skin: Skin is warm and dry. No cyanosis. Nails show no clubbing.  Psychiatric: He has a normal mood and affect. His speech is normal and behavior is normal. Judgment and thought content normal.    Wt Readings from Last 3 Encounters:  04/04/19 181 lb (82.1 kg)  03/26/19 179 lb (81.2 kg)  03/18/19 188 lb 7.9 oz (85.5 kg)     ASSESSMENT & PLAN:   1. CAD involving the native coronary arteries without angina: He is doing well without any symptoms concerning for angina.  Continue secondary prevention with dual antiplatelet therapy including aspirin and Effient without interruption for at least the next 12 months dating back to date of his PCI along with continuation of atorvastatin and carvedilol.  He has been referred to cardiac rehab.  Aggressive risk factor modification.  No plans for further ischemic evaluation at this time.  Bruising along his periumbilical abdomen is consistent with injections of heparin/Lovenox.  No evidence of RPH.  Check BMP and CBC.  He is to remain out of work for another 2 weeks for a total of 1 month dating back to date of his MI.  Work note has been provided.  2. HTN: Blood pressure is mildly elevated today at 140/82.  He does not have a blood pressure cuff at home to check.  Add amlodipine 5 mg daily.  Continue current dose carvedilol 3.125 mg twice daily.   Sodium diet recommended  3. HLD: LDL of 116 during admission, not on statin therapy at that time.  Tolerating atorvastatin 80 mg daily.  Goal LDL less than 70.  Recommend rechecking fasting lipid  panel and liver function in approximately 8 weeks when he is seen in follow-up.  If LDL remains above goal at that time recommend addition of Zetia 10 mg daily.  4. Tobacco abuse: Patient indicates he has not smoked since his hospital discharge.  Disposition: F/u with Dr. Kirke Corin or an APP in 2 months.   Medication Adjustments/Labs and Tests Ordered: Current medicines are reviewed at length with the patient today.  Concerns regarding medicines are outlined above. Medication changes, Labs and Tests ordered today are summarized above and listed in the Patient Instructions accessible in Encounters.   Signed, Eula Listen, PA-C 04/04/2019 8:30 AM     [facility] [place] [address] [place], [place] [address] [phone]

## 2019-04-04 ENCOUNTER — Other Ambulatory Visit: Payer: Self-pay

## 2019-04-04 ENCOUNTER — Encounter: Payer: Self-pay | Admitting: Physician Assistant

## 2019-04-04 ENCOUNTER — Ambulatory Visit (INDEPENDENT_AMBULATORY_CARE_PROVIDER_SITE_OTHER): Payer: Medicare Other | Admitting: Physician Assistant

## 2019-04-04 VITALS — BP 140/82 | HR 65 | Ht 63.0 in | Wt 181.0 lb

## 2019-04-04 DIAGNOSIS — I2510 Atherosclerotic heart disease of native coronary artery without angina pectoris: Secondary | ICD-10-CM

## 2019-04-04 DIAGNOSIS — E785 Hyperlipidemia, unspecified: Secondary | ICD-10-CM

## 2019-04-04 DIAGNOSIS — I214 Non-ST elevation (NSTEMI) myocardial infarction: Secondary | ICD-10-CM | POA: Diagnosis not present

## 2019-04-04 DIAGNOSIS — I10 Essential (primary) hypertension: Secondary | ICD-10-CM | POA: Diagnosis not present

## 2019-04-04 DIAGNOSIS — I200 Unstable angina: Secondary | ICD-10-CM | POA: Diagnosis not present

## 2019-04-04 DIAGNOSIS — Z87891 Personal history of nicotine dependence: Secondary | ICD-10-CM

## 2019-04-04 MED ORDER — AMLODIPINE BESYLATE 5 MG PO TABS: 5.0000 mg | ORAL_TABLET | Freq: Every day | ORAL | 3 refills | Status: DC

## 2019-04-04 NOTE — Patient Instructions (Signed)
Medication Instructions:   1. Your physician has recommended you make the following change in your medication:   - START AMLODIPINE- Take one tablet (5 MG) by mouth daily.   *If you need a refill on your cardiac medications before your next appointment, please call your pharmacy*  Lab Work:  1. Your physician recommends that you have lab work today(CBC, BMET)  If you have labs (blood work) drawn today and your tests are completely normal, you will receive your results only by: Marland Kitchen MyChart Message (if you have MyChart) OR . A paper copy in the mail If you have any lab test that is abnormal or we need to change your treatment, we will call you to review the results.  Testing/Procedures:  1. None Ordered  Follow-Up: At [facility], you and your health needs are our priority.  As part of our continuing mission to provide you with exceptional heart care, we have created designated Provider Care Teams.  These Care Teams include your primary Cardiologist (physician) and Advanced Practice Providers (APPs -  Physician Assistants and Nurse Practitioners) who all work together to provide you with the care you need, when you need it.  Your next appointment:   2 month(s)  The format for your next appointment:   In Person  Provider:  You may see Kathlyn Sacramento, MD or Christell Faith, PA-C

## 2019-04-05 ENCOUNTER — Telehealth: Payer: Self-pay

## 2019-04-05 LAB — BASIC METABOLIC PANEL
BUN/Creatinine Ratio: 14 (ref 9–20)
BUN: 13 mg/dL (ref 6–24)
CO2: 22 mmol/L (ref 20–29)
Calcium: 8.9 mg/dL (ref 8.7–10.2)
Chloride: 107 mmol/L — ABNORMAL HIGH (ref 96–106)
Creatinine, Ser: 0.95 mg/dL (ref 0.76–1.27)
GFR calc Af Amer: 107 mL/min/{1.73_m2} (ref >59)
GFR calc non Af Amer: 92 mL/min/{1.73_m2} (ref >59)
Glucose: 90 mg/dL (ref 65–99)
Potassium: 4.6 mmol/L (ref 3.5–5.2)
Sodium: 141 mmol/L (ref 134–144)

## 2019-04-05 LAB — CBC
Hematocrit: 41.4 % (ref 37.5–51.0)
Hemoglobin: 14.6 g/dL (ref 13.0–17.7)
MCH: 30.9 pg (ref 26.6–33.0)
MCHC: 35.3 g/dL (ref 31.5–35.7)
MCV: 88 fL (ref 79–97)
Platelets: 271 10*3/uL (ref 150–450)
RBC: 4.72 x10E6/uL (ref 4.14–5.80)
RDW: 14.6 % (ref 11.6–15.4)
WBC: 4.8 10*3/uL (ref 3.4–10.8)

## 2019-04-05 NOTE — Telephone Encounter (Signed)
Call to patient to review labs. No further orders at this time.    Made follow up apt for next Friday dec 11th.  Pt reports that he will need transportation to appt. Forwarding to scheduling for assistance.

## 2019-04-05 NOTE — Telephone Encounter (Signed)
-----   Message from Rise Mu, PA-C sent at 04/05/2019  8:02 AM EST ----- Post-cath blood count normal. Renal function normal.  Potassium at goal.

## 2019-04-05 NOTE — Telephone Encounter (Signed)
Spoke with [employer] transportation .  Arranged transportation appt for patient .    Patient aware appt for [phone] and they will arrive to pick him up early and at this time there is no fee.    Contacted [facility] [phone]

## 2019-04-06 NOTE — Progress Notes (Signed)
Cardiology Office Note    Date:  04/14/2019   ID:  Jeff Rios, DOB [birth year], MRN [id number]  PCP:  Patient, [name]  Cardiologist:  Lorine Bears, MD  Electrophysiologist:  None   Chief Complaint: Follow up  History of Present Illness:   Jeff Rios is a 53 y.o. male with history of CAD with recent non-STEMI on 05/17/2018 status post PCI as outlined below, HTN, HLD, and tobacco abuse who presents for follow-up of CAD.  Prior to his hospital admission in mid 03/2019 he had no previously known cardiac history.  He was seen in the ED on 03/05/2019 with idiopathic angioedema and was treated with steroids.  There was some concern this may have been related to his amlodipine leading to its discontinuation and initiation of metoprolol.  He presented to the hospital on 03/18/2019 with severe substernal chest discomfort that woke him up from sleep.  EKG showed sinus rhythm with less than 1 mm ST elevation in lead III with no associated ST elevation in other leads with anterolateral T wave inversion and minor ST depression in leads I and aVL.  Initial high-sensitivity troponin was 59 with a delta of 210, subsequently peaking at 24,643.  Patient continued to note 10 out of 10 chest pain and in this setting he underwent urgent LHC which showed significant two-vessel CAD with the culprit being an occluded distal RCA with faint left to right collaterals.  The supplied area was not large and this was felt to be the likely reason for nondiagnostic EKG changes for inferior STEMI.  There was also significant mid LAD stenosis which appeared to be hazy.  LV systolic function with a spade-like appearance of the LV and mid cavity gradient suggestive of variant of hypertrophic cardiomyopathy with significantly elevated LVEDP in the low 30s.  Patient underwent successful PCI/DES to the distal RCA.  Please see remaining cath details as outlined below.  Echo showed an EF of 60 to 65%, mild LVH, grade 1  diastolic dysfunction, normal RV systolic function and cavity size, no significant valvular abnormality.  He underwent staged PCI to the mid LAD on 03/20/2019.  Upon reviewing his echo images there was no significant LVH with only a mild gradient leading to no further work-up.  He was subsequently seen in the ED on 03/26/2019 with abdominal bruising at the site of his Lovenox/heparin injections.  No imaging was obtained.  EKG showed sinus rhythm with inferolateral T wave inversion.  Labs showed a stable CBC as outlined below.  No further work-up was recommended.  He was doing well in hospital follow-up on 04/04/2019 without any symptoms concerning for angina and was tolerating dual antiplatelet therapy.  Amlodipine 5 mg daily was added in the setting of mildly elevated BP 140/82.  He indicated he had not smoked tobacco since his hospital discharge.  He comes in doing very well from a cardiac perspective.  He denies any chest pain.  He did have an isolated episode of shortness of breath in which she stood up too quickly.  However, outside of this he has not had any shortness of breath.  He denies any palpitations, dizziness, presyncope, syncope, lower extremity swelling, abdominal distention, orthopnea, PND, or early satiety.  No falls, hematochezia, melena, hemoptysis, hematemesis, or hematuria.  He is tolerating dual antiplatelet therapy without issues and denies missing any doses.  He continues to not smoke tobacco.  He does not have a way to check his blood pressure at home.  He is  compliant and tolerating amlodipine, Lipitor, and carvedilol.  He would like for [place] to see if he can obtain medications through medication management.  He is trying to work out transportation issues to get to and from cardiac rehab.   Labs: 04/2019 - potassium 4.6, BUN 13, serum creatinine 0.95, Hgb 14.6, PLT 271 03/2019 - A1c 5.4, total cholesterol 164, triglyceride 32, HDL 42, LDL 116, albumin 4.0, AST/ALT  normal  Past Medical History:  Diagnosis Date   CAD (coronary artery disease)    HLD (hyperlipidemia)    Hypertension     Past Surgical History:  Procedure Laterality Date   CARDIAC CATHETERIZATION     CORONARY STENT INTERVENTION N/A 03/18/2019   Procedure: CORONARY STENT INTERVENTION;  Surgeon: Wellington Hampshire, MD;  Location: [facility];  Service: Cardiovascular;  Laterality: N/A;   CORONARY STENT INTERVENTION N/A 03/20/2019   Procedure: CORONARY STENT INTERVENTION;  Surgeon: Wellington Hampshire, MD;  Location: [facility];  Service: Cardiovascular;  Laterality: N/A;   CORONARY/GRAFT ACUTE MI REVASCULARIZATION N/A 03/18/2019   Procedure: Coronary/Graft Acute MI Revascularization;  Surgeon: Wellington Hampshire, MD;  Location: [facility];  Service: Cardiovascular;  Laterality: N/A;   LEFT HEART CATH AND CORONARY ANGIOGRAPHY N/A 03/18/2019   Procedure: LEFT HEART CATH AND CORONARY ANGIOGRAPHY;  Surgeon: Wellington Hampshire, MD;  Location: [facility];  Service: Cardiovascular;  Laterality: N/A;    Current Medications: Current Meds  Medication Sig   amLODipine (NORVASC) 5 MG tablet Take 1 tablet (5 mg total) by mouth daily.   aspirin 81 MG EC tablet Take 1 tablet (81 mg total) by mouth daily.   atorvastatin (LIPITOR) 80 MG tablet Take 1 tablet (80 mg total) by mouth daily at 6 PM.   carvedilol (COREG) 3.125 MG tablet Take 1 tablet (3.125 mg total) by mouth 2 (two) times daily with a meal.   nitroGLYCERIN (NITROSTAT) 0.4 MG SL tablet Place 1 tablet (0.4 mg total) under the tongue every 5 (five) minutes as needed for chest pain.   prasugrel (EFFIENT) 10 MG TABS tablet Take 1 tablet (10 mg total) by mouth daily.    Allergies:   Amlodipine   Social History   Socioeconomic History   Marital status: Single    Spouse name: Not on file   Number of children: Not on file   Years of education: Not on file   Highest education level: Not  on file  Occupational History   Not on file  Tobacco Use   Smoking status: Current Every Day Smoker    Packs/day: 1.00    Years: 30.00    Pack years: 30.00    Types: Cigarettes   Smokeless tobacco: Never Used   Tobacco comment: ready to quit. Havent smoked in about 4 weeks.  Substance and Sexual Activity   Alcohol use: No   Drug use: No   Sexual activity: Not on file  Other Topics Concern   Not on file  Social History Narrative   Not on file   Social Determinants of Health   Financial Resource Strain:    Difficulty of Paying Living Expenses: Not on file  Food Insecurity:    Worried About [place] in the Last Year: Not on file   Ran Out of Food in the Last Year: Not on file  Transportation Needs:    Lack of Transportation (Medical): Not on file   Lack of Transportation (Non-Medical): Not on file  Physical  Activity:    Days of Exercise per Week: Not on file   Minutes of Exercise per Session: Not on file  Stress:    Feeling of Stress : Not on file  Social Connections:    Frequency of Communication with Friends and Family: Not on file   Frequency of Social Gatherings with Friends and Family: Not on file   Attends Religious Services: Not on file   Active Member of Clubs or Organizations: Not on file   Attends Banker Meetings: Not on file   Marital Status: Not on file     Family History:  The patient's Family history is unknown by patient.  ROS:   Review of Systems  Constitutional: Negative for chills, diaphoresis, fever, malaise/fatigue and weight loss.  HENT: Negative for congestion.   Eyes: Negative for discharge and redness.  Respiratory: Positive for shortness of breath. Negative for cough, hemoptysis, sputum production and wheezing.        Isolated episode of shortness of breath as outlined above  Cardiovascular: Negative for chest pain, palpitations, orthopnea, claudication, leg swelling and PND.   Gastrointestinal: Negative for abdominal pain, blood in stool, heartburn, melena, nausea and vomiting.  Genitourinary: Negative for hematuria.  Musculoskeletal: Negative for falls and myalgias.  Skin: Negative for rash.  Neurological: Negative for dizziness, tingling, tremors, sensory change, speech change, focal weakness, loss of consciousness and weakness.  Endo/Heme/Allergies: Does not bruise/bleed easily.  Psychiatric/Behavioral: Negative for substance abuse. The patient is not nervous/anxious.   All other systems reviewed and are negative.    EKGs/Labs/Other Studies Reviewed:    Studies reviewed were summarized above. The additional studies were reviewed today:  LHC 03/18/2019:  The left ventricular systolic function is normal.  LV end diastolic pressure is normal.  The left ventricular ejection fraction is 55-65% by visual estimate.  Mid LAD lesion is 80% stenosed.  Prox RCA lesion is 30% stenosed.  Dist RCA lesion is 100% stenosed.  Post intervention, there is a 0% residual stenosis.  A drug-eluting stent was successfully placed using a STENT RESOLUTE ONYX 2.25X22.  RPDA lesion is 40% stenosed.  1. Significant two-vessel coronary artery disease. The culprit is an occluded distal right coronary artery with faint left-to-right collaterals. The supplied area is not large and that is the likely reason for nondiagnostic EKG changes for inferior STEMI. There is also significant mid LAD stenosis which appears to be hazy. 2. 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 Significantly elevated LVEDP in the low 30s. 3. Successful angioplasty and drug-eluting stent placement to the distal RCA.  Recommendations: Continue dual antiplatelet therapy for at least 1 year. Aggressive treatment of risk factors. Recommend staged PCI of the LAD before hospital  discharge. This is planned for Monday morning. __________  2D Echo 03/18/2019: 1. Left ventricular ejection fraction, by visual estimation, is 60 to 65%. The left ventricle has normal function. There is mildly increased left ventricular hypertrophy. 2. Left ventricular diastolic parameters are consistent with Grade I diastolic dysfunction (impaired relaxation). 3. Global right ventricle has normal systolic function.The right ventricular size is normal. No increase in right ventricular wall thickness. 4. The mitral valve is normal in structure. No evidence of mitral valve regurgitation. No evidence of mitral stenosis. 5. The tricuspid valve is normal in structure. Tricuspid valve regurgitation is not demonstrated. 6. The aortic valve is normal in structure. Aortic valve regurgitation is not visualized. No evidence  of aortic valve sclerosis or stenosis. 7. The pulmonic valve was normal in structure. Pulmonic valve regurgitation is trivial. 8. TR signal is inadequate for assessing pulmonary artery systolic pressure. 9. The inferior vena cava is normal in size with greater than 50% respiratory variability, suggesting right atrial pressure of 3 mmHg. __________  PCI 03/20/2019:  RPDA lesion is 40% stenosed.  Prox RCA lesion is 30% stenosed.  Previously placed Dist RCA drug eluting stent is widely patent.  Balloon angioplasty was performed.  Mid LAD lesion is 80% stenosed.  Post intervention, there is a 0% residual stenosis.  A drug-eluting stent was successfully placed using a STENT RESOLUTE ONYX 3.0X15.  Successful angioplasty and drug-eluting stent placement to the mid LAD.  Recommendations: Continue dual antiplatelet therapy for at least 1 year. Aggressive treatment of risk factors. Likely discharge home tomorrow. He is to stay off work for 2 weeks.   EKG:  EKG is ordered today.  The EKG ordered today demonstrates NSR, 74 bpm, right axis deviation, possible left  atrial enlargement, inferolateral T wave inversion largely unchanged from prior study consistent with prior known MI  Recent Labs: 03/18/2019: ALT 21 04/04/2019: BUN 13; Creatinine, Ser 0.95; Hemoglobin 14.6; Platelets 271; Potassium 4.6; Sodium 141  Recent Lipid Panel    Component Value Date/Time   CHOL 164 03/18/2019 0701   TRIG 32 03/18/2019 0701   HDL 42 03/18/2019 0701   CHOLHDL 3.9 03/18/2019 0701   VLDL 6 03/18/2019 0701   LDLCALC 116 (H) 03/18/2019 0701    PHYSICAL EXAM:    VS:  BP 122/82 (BP Location: Left Arm, Patient Position: Sitting, Cuff Size: Normal)    Pulse 74    Temp (!) 97.5 F (36.4 C)    Ht  (1.6 m)    Wt 179 lb 8 oz (81.4 kg)    SpO2 96%    BMI 31.80 kg/m   BMI: Body mass index is 31.8 kg/m.  Physical Exam  Constitutional: He is oriented to person, place, and time. He appears well-developed and well-nourished.  HENT:  Head: Normocephalic and atraumatic.  Eyes: Right eye exhibits no discharge. Left eye exhibits no discharge.  Neck: No JVD present.  Cardiovascular: Normal rate, regular rhythm, S1 normal, S2 normal and normal heart sounds. Exam reveals no distant heart sounds, no friction rub, no midsystolic click and no opening snap.  No murmur heard. Pulses:      Posterior tibial pulses are 2+ on the right side and 2+ on the left side.  Pulmonary/Chest: Effort normal and breath sounds normal. No respiratory distress. He has no decreased breath sounds. He has no wheezes. He has no rales. He exhibits no tenderness.  Abdominal: Soft. He exhibits no distension. There is no abdominal tenderness.  Musculoskeletal:        General: No edema.     Cervical back: Normal range of motion.  Neurological: He is alert and oriented to person, place, and time.  Skin: Skin is warm and dry. No cyanosis. Nails show no clubbing.  Psychiatric: He has a normal mood and affect. His speech is normal and behavior is normal. Judgment and thought content normal.    Wt Readings  from Last 3 Encounters:  04/14/19 179 lb 8 oz (81.4 kg)  04/04/19 181 lb (82.1 kg)  03/26/19 179 lb (81.2 kg)     ASSESSMENT & PLAN:   1. CAD involving the native coronary arteries with recent non-STEMI status post PCI as outlined above without angina: He is  doing well without any symptoms concerning for angina.  Continue DAPT with aspirin and Effient without interruption through at least 12 months dating back to the date of his staged PCI.  We will contact medication management for the patient to see if he qualifies for their program.  The importance of DAPT was discussed with the patient in detail including for the patient to contact our office if he has any issues obtaining medications.  Continue current medications as outlined below.  He is working working on transportation issues so he can get to and from cardiac rehab.  No plans for further ischemic evaluation at this time.  2. HTN: Blood pressure is well controlled today.  Continue amlodipine and carvedilol.  3. HLD: LDL of 116 during his admission in 03/2019 with the patient not being on statin therapy at that time.  Tolerating atorvastatin 80 mg daily with goal LDL being less than 70.  Recommend checking fasting lipid panel and liver function in mid 05/2019.  If LDL remains above goal at that time recommend adding Zetia 10 mg daily.  4. Tobacco use: He continues to abstain from smoking dating back to his hospital admission in 03/2019.  Disposition: F/u with Dr. Kirke CorinArida or an APP in 2 months.   Medication Adjustments/Labs and Tests Ordered: Current medicines are reviewed at length with the patient today.  Concerns regarding medicines are outlined above. Medication changes, Labs and Tests ordered today are summarized above and listed in the Patient Instructions accessible in Encounters.   Signed, Eula Listenyan Noelle Hoogland, PA-C 04/14/2019 9:18 AM     [facility] [address] [place], [place] [address] [phone]

## 2019-04-11 ENCOUNTER — Encounter: Payer: Medicare Other | Attending: Cardiovascular Disease

## 2019-04-11 ENCOUNTER — Other Ambulatory Visit: Payer: Self-pay

## 2019-04-11 DIAGNOSIS — I214 Non-ST elevation (NSTEMI) myocardial infarction: Secondary | ICD-10-CM

## 2019-04-11 NOTE — Progress Notes (Signed)
Virtual Orientation performed. Will call patient to schedule EP and RD evaluation when transportation becomes available for patient. Diagnosis can be found in [facility]  04/04/2019

## 2019-04-14 ENCOUNTER — Encounter: Payer: Self-pay | Admitting: Physician Assistant

## 2019-04-14 ENCOUNTER — Other Ambulatory Visit: Payer: Self-pay

## 2019-04-14 ENCOUNTER — Ambulatory Visit (INDEPENDENT_AMBULATORY_CARE_PROVIDER_SITE_OTHER): Payer: Medicare Other | Admitting: Physician Assistant

## 2019-04-14 VITALS — BP 122/82 | HR 74 | Temp 97.5°F | Ht 63.0 in | Wt 179.5 lb

## 2019-04-14 DIAGNOSIS — I2510 Atherosclerotic heart disease of native coronary artery without angina pectoris: Secondary | ICD-10-CM | POA: Diagnosis not present

## 2019-04-14 DIAGNOSIS — I214 Non-ST elevation (NSTEMI) myocardial infarction: Secondary | ICD-10-CM

## 2019-04-14 DIAGNOSIS — I200 Unstable angina: Secondary | ICD-10-CM

## 2019-04-14 DIAGNOSIS — E785 Hyperlipidemia, unspecified: Secondary | ICD-10-CM | POA: Diagnosis not present

## 2019-04-14 DIAGNOSIS — I10 Essential (primary) hypertension: Secondary | ICD-10-CM

## 2019-04-14 DIAGNOSIS — Z87891 Personal history of nicotine dependence: Secondary | ICD-10-CM

## 2019-04-14 NOTE — Patient Instructions (Signed)
Medication Instructions:  Your physician recommends that you continue on your current medications as directed. Please refer to the Current Medication list given to you today.  *If you need a refill on your cardiac medications before your next appointment, please call your pharmacy*  Lab Work: Your physician recommends that you return for lab work in: 1 weeks at the medical mall. You will need to be fasting.  (fasting cholesterol, LFTs) No appt is needed. Hours are M-F 7AM- 6 PM.  If you have labs (blood work) drawn today and your tests are completely normal, you will receive your results only by: Marland Kitchen MyChart Message (if you have MyChart) OR . A paper copy in the mail If you have any lab test that is abnormal or we need to change your treatment, we will call you to review the results.  Testing/Procedures: None ordered  Follow-Up: At [facility], you and your health needs are our priority.  As part of our continuing mission to provide you with exceptional heart care, we have created designated Provider Care Teams.  These Care Teams include your primary Cardiologist (physician) and Advanced Practice Providers (APPs -  Physician Assistants and Nurse Practitioners) who all work together to provide you with the care you need, when you need it.  Your next appointment:   2 month(s)  The format for your next appointment:   In Person  Provider:    You may see Kathlyn Sacramento, MD or Christell Faith, PA-C.

## 2019-04-21 ENCOUNTER — Telehealth: Payer: Self-pay | Admitting: Physician Assistant

## 2019-04-21 NOTE — Telephone Encounter (Signed)
Patient called and states he is unable to get his labwork done due to no transportation. States he is not sure when he will be able to get it done.

## 2019-04-21 NOTE — Telephone Encounter (Signed)
Spoke with patient and reviewed that we need him to get labs next week. Instructed him to go to [facility] to have those done and no appointment is needed. Reviewed his medications using [email or web address] and all medications were $10-15 dollars. Advised to go to pharmacy and request they use [facility] price with no insurance. He verbalized understanding of our conversation and had no further questions at this time.

## 2019-04-21 NOTE — Telephone Encounter (Signed)
Patient calling back Also states that he is unable to afford some of his medications and would like to know if there is anything we can do to help  Please call to discuss

## 2019-05-30 NOTE — Progress Notes (Signed)
Cardiology Office Note    Date:  06/07/2019   ID:  Jeff Rios, DOB Aug 06, 1965, MRN [id number]  PCP:  [facility]  Cardiologist:  Lorine Bears, MD  Electrophysiologist:  None   Chief Complaint: Follow up  History of Present Illness:   Jeff Rios is a 54 y.o. male with history of CAD with non-STEMI on 03/18/2019 status post PCI as outlined below, HTN,HLD,and tobacco abuse who presents for follow-up of CAD.  He was seen in the ED on 03/05/2019 with idiopathic angioedema and was treated with steroids. There was some concern this may have been related to his amlodipine leading to its discontinuation and initiation of metoprolol. He presented to the hospital on 03/18/2019 with severe substernal chest discomfort that woke him up from sleep. EKG showed sinus rhythm with less than 1 mm ST elevation in lead IIIwith no associated ST elevation in other leads with anterolateral T wave inversion and minor ST depression in leads Iand aVL. Initial high-sensitivity troponin was 59 with a delta of 210, subsequently peaking at 24,643. He continued to note 10/10 chest pain and in this setting he underwent urgent LHC which showed significant two-vessel CAD with the culprit being an occluded distal RCA with faint left-to-right collaterals. The supplied area was not large and this was felt to be the likely reason for nondiagnostic EKG changes for inferior STEMI. There was also significant mid LAD stenosis which appeared to be hazy. LV systolic function with a spade-like appearance of the LV and mid cavity gradient suggestive of variant of hypertrophic cardiomyopathy with significantly elevated LVEDP in the low 30s. He underwent successful PCI/DES to the distal RCA. Please see remaining cath details as outlined below.Echo showed an EF of 60 to 65%, mild LVH, grade 1 diastolic dysfunction, normal RV systolic function and cavity size,no significant valvular  abnormality.He underwent staged PCI to the mid LAD on 03/20/2019.  Upon reviewing his echo images there was no significant LVH with only a mild gradient leading to no further work-up.  He was subsequently seen in the ED on 03/26/2019 with abdominal bruisingat the site of his Lovenox/heparin injections. No imaging was obtained. EKG showed sinus rhythm with inferolateral T wave inversion. Labs showed a stable CBC as outlined below. No further work-up was recommended.  He was doing well in hospital follow-up on 04/04/2019 without any symptoms concerning for angina and was tolerating dual antiplatelet therapy.  Amlodipine 5 mg daily was added in the setting of mildly elevated BP 140/82.  He indicated he had not smoked tobacco since his hospital discharge.  He was last seen in the office on 04/14/2019 and was doing very well, noting only an isolated episode of SOB after standing quickly. He continued to abstain from smoking.   He comes in doing well from a cardiac perspective.  He denies any chest pain, shortness of breath, palpitations, dizziness, presyncope, syncope, lower extremity swelling, abdominal distention, or orthopnea.  He does wonder if his blood pressure is running high as he states at times he just feels "different."  He is unable to elaborate on this the further.  He also notes an occasional "funny" feeling on the upper anterior left side chest wall which is not described as pain, discomfort, or pressure.  It does not feel similar to his symptoms leading up to his original presentation to the hospital.  He has been compliant with all medications including DAPT.  No falls, hematochezia, or melena.  He does note a mild bruise along  the right lower extremity.  He continues to not smoke.   Labs independently reviewed: 04/2019 - potassium 4.6, BUN 13, serum creatinine 0.95, Hgb 14.6, PLT 271 03/2019-A1c 5.4, total cholesterol 164, triglyceride 32, HDL 42, LDL 116, albumin 4.0, AST/ALT  normal  Past Medical History:  Diagnosis Date  . CAD (coronary artery disease)   . HLD (hyperlipidemia)   . Hypertension     Past Surgical History:  Procedure Laterality Date  . CARDIAC CATHETERIZATION    . CORONARY STENT INTERVENTION N/A 03/18/2019   Procedure: CORONARY STENT INTERVENTION;  Surgeon: Iran Ouch, MD;  Location: [facility] INVASIVE CV LAB;  Service: Cardiovascular;  Laterality: N/A;  . CORONARY STENT INTERVENTION N/A 03/20/2019   Procedure: CORONARY STENT INTERVENTION;  Surgeon: Iran Ouch, MD;  Location: [facility] INVASIVE CV LAB;  Service: Cardiovascular;  Laterality: N/A;  . CORONARY/GRAFT ACUTE MI REVASCULARIZATION N/A 03/18/2019   Procedure: Coronary/Graft Acute MI Revascularization;  Surgeon: Iran Ouch, MD;  Location: [facility] INVASIVE CV LAB;  Service: Cardiovascular;  Laterality: N/A;  . LEFT HEART CATH AND CORONARY ANGIOGRAPHY N/A 03/18/2019   Procedure: LEFT HEART CATH AND CORONARY ANGIOGRAPHY;  Surgeon: Iran Ouch, MD;  Location: [facility] INVASIVE CV LAB;  Service: Cardiovascular;  Laterality: N/A;    Current Medications: Current Meds  Medication Sig  . amLODipine (NORVASC) 5 MG tablet Take 1 tablet (5 mg total) by mouth daily.  Marland Kitchen aspirin 81 MG EC tablet Take 1 tablet (81 mg total) by mouth daily.  Marland Kitchen atorvastatin (LIPITOR) 80 MG tablet Take 1 tablet (80 mg total) by mouth daily at 6 PM.  . carvedilol (COREG) 3.125 MG tablet Take 1 tablet (3.125 mg total) by mouth 2 (two) times daily with a meal.  . prasugrel (EFFIENT) 10 MG TABS tablet Take 1 tablet (10 mg total) by mouth daily.    Allergies:   Amlodipine   Social History   Socioeconomic History  . Marital status: Single    Spouse name: Not on file  . Number of children: Not on file  . Years of education: Not on file  . Highest education level: Not on file  Occupational History  . Not on file  Tobacco Use  . Smoking status: Current Every Day Smoker    Packs/day: 1.00    Years: 30.00      Pack years: 30.00    Types: Cigarettes  . Smokeless tobacco: Never Used  . Tobacco comment: ready to quit. Havent smoked in about 4 weeks.  Substance and Sexual Activity  . Alcohol use: No  . Drug use: No  . Sexual activity: Not on file  Other Topics Concern  . Not on file  Social History Narrative  . Not on file   Social Determinants of Health   Financial Resource Strain:   . Difficulty of Paying Living Expenses: Not on file  Food Insecurity:   . Worried About Programme researcher, broadcasting/film/video in the Last Year: Not on file  . Ran Out of Food in the Last Year: Not on file  Transportation Needs:   . Lack of Transportation (Medical): Not on file  . Lack of Transportation (Non-Medical): Not on file  Physical Activity:   . Days of Exercise per Week: Not on file  . Minutes of Exercise per Session: Not on file  Stress:   . Feeling of Stress : Not on file  Social Connections:   . Frequency of Communication with Friends and Family: Not on file  . Frequency of  Social Gatherings with Friends and Family: Not on file  . Attends Religious Services: Not on file  . Active Member of Clubs or Organizations: Not on file  . Attends Archivist Meetings: Not on file  . Marital Status: Not on file     Family History:  The patient's Family history is unknown by patient.  ROS:   Review of Systems  Constitutional: Negative for chills, diaphoresis, fever, malaise/fatigue and weight loss.  HENT: Negative for congestion.   Eyes: Negative for discharge and redness.  Respiratory: Negative for cough, hemoptysis, sputum production, shortness of breath and wheezing.   Cardiovascular: Negative for chest pain, palpitations, orthopnea, claudication, leg swelling and PND.  Gastrointestinal: Negative for abdominal pain, blood in stool, heartburn, melena, nausea and vomiting.  Genitourinary: Negative for hematuria.  Musculoskeletal: Negative for falls and myalgias.  Skin: Negative for rash.   Neurological: Negative for dizziness, tingling, tremors, sensory change, speech change, focal weakness, loss of consciousness and weakness.  Endo/Heme/Allergies: Bruises/bleeds easily.  Psychiatric/Behavioral: Negative for substance abuse. The patient is nervous/anxious.   All other systems reviewed and are negative.    EKGs/Labs/Other Studies Reviewed:    Studies reviewed were summarized above. The additional studies were reviewed today:  LHC 03/18/2019:  The left ventricular systolic function is normal.  LV end diastolic pressure is normal.  The left ventricular ejection fraction is 55-65% by visual estimate.  Mid LAD lesion is 80% stenosed.  Prox RCA lesion is 30% stenosed.  Dist RCA lesion is 100% stenosed.  Post intervention, there is a 0% residual stenosis.  A drug-eluting stent was successfully placed using a STENT RESOLUTE ONYX 2.25X22.  RPDA lesion is 40% stenosed.  1. Significant two-vessel coronary artery disease. The culprit is an occluded distal right coronary artery with faint left-to-right collaterals. The supplied area is not large and that is the likely reason for nondiagnostic EKG changes for inferior STEMI. There is also significant mid LAD stenosis which appears to be hazy. 2. 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 Significantly elevated LVEDP in the low 30s. 3. Successful angioplasty and drug-eluting stent placement to the distal RCA.  Recommendations: Continue dual antiplatelet therapy for at least 1 year. Aggressive treatment of risk factors. Recommend staged PCI of the LAD before hospital discharge. This is planned for Monday morning. __________  2D Echo 03/18/2019: 1. Left ventricular ejection fraction, by visual estimation, is 60 to 65%. The left ventricle has normal function. There is mildly increased left  ventricular hypertrophy. 2. Left ventricular diastolic parameters are consistent with Grade I diastolic dysfunction (impaired relaxation). 3. Global right ventricle has normal systolic function.The right ventricular size is normal. No increase in right ventricular wall thickness. 4. The mitral valve is normal in structure. No evidence of mitral valve regurgitation. No evidence of mitral stenosis. 5. The tricuspid valve is normal in structure. Tricuspid valve regurgitation is not demonstrated. 6. The aortic valve is normal in structure. Aortic valve regurgitation is not visualized. No evidence of aortic valve sclerosis or stenosis. 7. The pulmonic valve was normal in structure. Pulmonic valve regurgitation is trivial. 8. TR signal is inadequate for assessing pulmonary artery systolic pressure. 9. The inferior vena cava is normal in size with greater than 50% respiratory variability, suggesting right atrial pressure of 3 mmHg. __________  PCI 03/20/2019:  RPDA lesion is 40% stenosed.  Prox RCA lesion is 30% stenosed.  Previously placed Dist RCA  drug eluting stent is widely patent.  Balloon angioplasty was performed.  Mid LAD lesion is 80% stenosed.  Post intervention, there is a 0% residual stenosis.  A drug-eluting stent was successfully placed using a STENT RESOLUTE ONYX 3.0X15.  Successful angioplasty and drug-eluting stent placement to the mid LAD.  Recommendations: Continue dual antiplatelet therapy for at least 1 year. Aggressive treatment of risk factors. Likely discharge home tomorrow. He is to stay off work for 2 weeks.   EKG:  EKG is ordered today.  The EKG ordered today demonstrates NSR, 87 bpm, inferolateral T wave inversion unchanged from prior  Recent Labs: 03/18/2019: ALT 21 04/04/2019: BUN 13; Creatinine, Ser 0.95; Hemoglobin 14.6; Platelets 271; Potassium 4.6; Sodium 141  Recent Lipid Panel    Component Value Date/Time   CHOL 164 03/18/2019 0701    TRIG 32 03/18/2019 0701   HDL 42 03/18/2019 0701   CHOLHDL 3.9 03/18/2019 0701   VLDL 6 03/18/2019 0701   LDLCALC 116 (H) 03/18/2019 0701    PHYSICAL EXAM:    VS:  BP 122/82 (BP Location: Left Arm, Patient Position: Sitting, Cuff Size: Normal)   Pulse 87   Ht 5\' 3"  (1.6 m)   Wt 182 lb 4 oz (82.7 kg)   SpO2 96%   BMI 32.28 kg/m   BMI: Body mass index is 32.28 kg/m.  Physical Exam  Constitutional: He is oriented to person, place, and time. He appears well-developed and well-nourished.  HENT:  Head: Normocephalic and atraumatic.  Eyes: Right eye exhibits no discharge. Left eye exhibits no discharge.  Neck: No JVD present.  Cardiovascular: Normal rate, regular rhythm, S1 normal, S2 normal and normal heart sounds. Exam reveals no distant heart sounds, no friction rub, no midsystolic click and no opening snap.  No murmur heard. Pulses:      Posterior tibial pulses are 2+ on the right side and 2+ on the left side.  Pulmonary/Chest: Effort normal and breath sounds normal. No respiratory distress. He has no decreased breath sounds. He has no wheezes. He has no rales. He exhibits no tenderness.  Abdominal: Soft. He exhibits no distension. There is no abdominal tenderness.  Musculoskeletal:        General: No edema.     Cervical back: Normal range of motion.     Comments: Resolving bruise along the posterior medial right lower extremity  Neurological: He is alert and oriented to person, place, and time.  Skin: Skin is warm and dry. No cyanosis. Nails show no clubbing.  Psychiatric: He has a normal mood and affect. His speech is normal and behavior is normal. Judgment and thought content normal.    Wt Readings from Last 3 Encounters:  06/07/19 182 lb 4 oz (82.7 kg)  04/14/19 179 lb 8 oz (81.4 kg)  04/04/19 181 lb (82.1 kg)     ASSESSMENT & PLAN:   1. CAD involving the native coronary arteries with recent non-STEMI status post PCI as outlined above without angina: He is doing well  without any symptoms having for angina.  Continue DAPT with aspirin and Effient without interruption through at least 12 months dating back to the date of his staged PCI.  He is working with medication management.  Patient understands the importance of DAPT.  He does note a "funny" feeling along the upper outer left anterior chest that he is unable to describe further and does not feel similar to his prior angina.  Given atypical presentation, no plans for further ischemic evaluation at  this time.  With bruise noted along right lower extremity we will check a CBC.  2. HTN: Blood pressure is well controlled in the office today.  However, the patient does wonder if his BP is elevated at home.  He seems quite concerned about this.  He does not have a BP cuff to monitor.  In this setting, we have agreed to titrate his amlodipine to 7.5 mg daily.  He will otherwise continue low-dose carvedilol and HCTZ.  Check CMP.  3. HLD: Patient had an LDL of 116 during his admission in 05/2018, not on a statin at that time.  He was subsequently started on atorvastatin 80 mg daily with goal LDL being less than 70.  Check lipid and liver today.  If LDL remains above goal of less than 70 we will add Zetia 10 mg daily.  4. Tobacco abuse: He continues to abstain from smoking dating back to his hospital admission.  Disposition: F/u with Dr. Kirke Corin or an APP in 3 months.   Medication Adjustments/Labs and Tests Ordered: Current medicines are reviewed at length with the patient today.  Concerns regarding medicines are outlined above. Medication changes, Labs and Tests ordered today are summarized above and listed in the Patient Instructions accessible in Encounters.   Signed, Eula Listen, PA-C 06/07/2019 9:07 AM     [facility] - [facility] [address] [place], [place] [address] [phone]

## 2019-06-07 ENCOUNTER — Encounter: Payer: Self-pay | Admitting: Physician Assistant

## 2019-06-07 ENCOUNTER — Other Ambulatory Visit: Payer: Self-pay

## 2019-06-07 ENCOUNTER — Ambulatory Visit (INDEPENDENT_AMBULATORY_CARE_PROVIDER_SITE_OTHER): Payer: Medicare Other | Admitting: Physician Assistant

## 2019-06-07 VITALS — BP 122/82 | HR 87 | Ht 63.0 in | Wt 182.2 lb

## 2019-06-07 DIAGNOSIS — E785 Hyperlipidemia, unspecified: Secondary | ICD-10-CM

## 2019-06-07 DIAGNOSIS — I214 Non-ST elevation (NSTEMI) myocardial infarction: Secondary | ICD-10-CM

## 2019-06-07 DIAGNOSIS — I2510 Atherosclerotic heart disease of native coronary artery without angina pectoris: Secondary | ICD-10-CM

## 2019-06-07 DIAGNOSIS — Z87891 Personal history of nicotine dependence: Secondary | ICD-10-CM

## 2019-06-07 DIAGNOSIS — I10 Essential (primary) hypertension: Secondary | ICD-10-CM | POA: Diagnosis not present

## 2019-06-07 MED ORDER — AMLODIPINE BESYLATE 5 MG PO TABS: 7.5000 mg | ORAL_TABLET | Freq: Every day | ORAL | 3 refills | Status: DC

## 2019-06-07 NOTE — Patient Instructions (Signed)
Medication Instructions:  1- INCREASE Amlodipine Take 1.5 tablets (7.5 mg total) by mouth daily. *If you need a refill on your cardiac medications before your next appointment, please call your pharmacy*  Lab Work: Your physician recommends that you have lab work today(CMET, Lipid, CBC)  If you have labs (blood work) drawn today and your tests are completely normal, you will receive your results only by: Marland Kitchen MyChart Message (if you have MyChart) OR . A paper copy in the mail If you have any lab test that is abnormal or we need to change your treatment, we will call you to review the results.  Testing/Procedures: None ordered   Follow-Up: At [facility], you and your health needs are our priority.  As part of our continuing mission to provide you with exceptional heart care, we have created designated Provider Care Teams.  These Care Teams include your primary Cardiologist (physician) and Advanced Practice Providers (APPs -  Physician Assistants and Nurse Practitioners) who all work together to provide you with the care you need, when you need it.  Your next appointment:   3 month(s)  The format for your next appointment:   In Person  Provider:    You may see Lorine Bears, MD or Eula Listen, PA-C.

## 2019-06-08 ENCOUNTER — Telehealth: Payer: Self-pay

## 2019-06-08 DIAGNOSIS — I2510 Atherosclerotic heart disease of native coronary artery without angina pectoris: Secondary | ICD-10-CM

## 2019-06-08 LAB — LIPID PANEL
Chol/HDL Ratio: 3.1 ratio (ref 0.0–5.0)
Cholesterol, Total: 200 mg/dL — ABNORMAL HIGH (ref 100–199)
HDL: 64 mg/dL
LDL Chol Calc (NIH): 122 mg/dL — ABNORMAL HIGH (ref 0–99)
Triglycerides: 80 mg/dL (ref 0–149)
VLDL Cholesterol Cal: 14 mg/dL (ref 5–40)

## 2019-06-08 LAB — COMPREHENSIVE METABOLIC PANEL
ALT: 29 IU/L (ref 0–44)
AST: 25 IU/L (ref 0–40)
Albumin/Globulin Ratio: 1.7 (ref 1.2–2.2)
Albumin: 4.5 g/dL (ref 3.8–4.9)
Alkaline Phosphatase: 77 IU/L (ref 39–117)
BUN/Creatinine Ratio: 17 (ref 9–20)
BUN: 18 mg/dL (ref 6–24)
Bilirubin Total: 1 mg/dL (ref 0.0–1.2)
CO2: 22 mmol/L (ref 20–29)
Calcium: 9.2 mg/dL (ref 8.7–10.2)
Chloride: 108 mmol/L — ABNORMAL HIGH (ref 96–106)
Creatinine, Ser: 1.06 mg/dL (ref 0.76–1.27)
GFR calc Af Amer: 93 mL/min/{1.73_m2} (ref >59)
GFR calc non Af Amer: 81 mL/min/{1.73_m2} (ref >59)
Globulin, Total: 2.6 g/dL (ref 1.5–4.5)
Glucose: 87 mg/dL (ref 65–99)
Potassium: 4.5 mmol/L (ref 3.5–5.2)
Sodium: 140 mmol/L (ref 134–144)
Total Protein: 7.1 g/dL (ref 6.0–8.5)

## 2019-06-08 LAB — CBC
Hematocrit: 40.9 % (ref 37.5–51.0)
Hemoglobin: 14.3 g/dL (ref 13.0–17.7)
MCH: 28.6 pg (ref 26.6–33.0)
MCHC: 35 g/dL (ref 31.5–35.7)
MCV: 82 fL (ref 79–97)
Platelets: 281 x10E3/uL (ref 150–450)
RBC: 5 x10E6/uL (ref 4.14–5.80)
RDW: 14.4 % (ref 11.6–15.4)
WBC: 5.1 x10E3/uL (ref 3.4–10.8)

## 2019-06-08 MED ORDER — EZETIMIBE 10 MG PO TABS: 10.0000 mg | ORAL_TABLET | Freq: Every day | ORAL | 3 refills | Status: DC

## 2019-06-08 NOTE — Telephone Encounter (Signed)
Call to patient to make him aware of lab results and POC.   Order placed for repeat labs and added zetia RX.    Pt verbalized understanding and requested that I send letter as reminder.  Letter sent.   Advised pt to call for any further questions or concerns.

## 2019-06-08 NOTE — Telephone Encounter (Signed)
-----   Message from Sondra Barges, PA-C sent at 06/08/2019  9:45 AM EST ----- Kidney and liver function normal.  Potassium at goal.  Blood count normal.  Cholesterol remains above goal.   Recommendations: Add Zetia 10 mg daily Continue Lipitor 80 mg daily Recheck fasting lipid panel and liver function in 8 weeks

## 2019-09-05 ENCOUNTER — Other Ambulatory Visit
Admission: RE | Admit: 2019-09-05 | Discharge: 2019-09-05 | Disposition: A | Discharge status: Home / Self Care | Payer: Medicare Other | Source: Ambulatory Visit | Attending: Family | Admitting: Family

## 2019-09-05 ENCOUNTER — Ambulatory Visit: Payer: Medicare Other | Admitting: Cardiovascular Disease

## 2019-09-05 ENCOUNTER — Encounter: Payer: Self-pay | Admitting: Family

## 2019-09-05 ENCOUNTER — Ambulatory Visit (INDEPENDENT_AMBULATORY_CARE_PROVIDER_SITE_OTHER): Payer: Medicare Other | Admitting: Family

## 2019-09-05 ENCOUNTER — Other Ambulatory Visit: Payer: Self-pay

## 2019-09-05 ENCOUNTER — Telehealth: Payer: Self-pay

## 2019-09-05 VITALS — BP 140/82 | HR 80 | Ht 63.0 in | Wt 190.0 lb

## 2019-09-05 DIAGNOSIS — E785 Hyperlipidemia, unspecified: Secondary | ICD-10-CM

## 2019-09-05 DIAGNOSIS — R072 Precordial pain: Secondary | ICD-10-CM

## 2019-09-05 DIAGNOSIS — Z598 Other problems related to housing and economic circumstances: Secondary | ICD-10-CM

## 2019-09-05 DIAGNOSIS — Z87891 Personal history of nicotine dependence: Secondary | ICD-10-CM

## 2019-09-05 DIAGNOSIS — Z599 Problem related to housing and economic circumstances, unspecified: Secondary | ICD-10-CM

## 2019-09-05 DIAGNOSIS — I2510 Atherosclerotic heart disease of native coronary artery without angina pectoris: Secondary | ICD-10-CM

## 2019-09-05 DIAGNOSIS — I10 Essential (primary) hypertension: Secondary | ICD-10-CM

## 2019-09-05 DIAGNOSIS — R600 Localized edema: Secondary | ICD-10-CM

## 2019-09-05 DIAGNOSIS — Z91148 Patient's other noncompliance with medication regimen for other reason: Secondary | ICD-10-CM

## 2019-09-05 DIAGNOSIS — Z9114 Patient's other noncompliance with medication regimen: Secondary | ICD-10-CM

## 2019-09-05 LAB — COMPREHENSIVE METABOLIC PANEL
ALT: 34 U/L (ref 0–44)
AST: 25 U/L (ref 15–41)
Albumin: 4.3 g/dL (ref 3.5–5.0)
Alkaline Phosphatase: 60 U/L (ref 38–126)
Anion gap: 4 — ABNORMAL LOW (ref 5–15)
BUN: 12 mg/dL (ref 6–20)
CO2: 27 mmol/L (ref 22–32)
Calcium: 8.7 mg/dL — ABNORMAL LOW (ref 8.9–10.3)
Chloride: 105 mmol/L (ref 98–111)
Creatinine, Ser: 1.06 mg/dL (ref 0.61–1.24)
GFR calc Af Amer: >60 mL/min (ref >60)
GFR calc non Af Amer: >60 mL/min (ref >60)
Glucose, Bld: 75 mg/dL (ref 70–99)
Potassium: 4.1 mmol/L (ref 3.5–5.1)
Sodium: 136 mmol/L (ref 135–145)
Total Bilirubin: 1.1 mg/dL (ref 0.3–1.2)
Total Protein: 7.5 g/dL (ref 6.5–8.1)

## 2019-09-05 LAB — LIPID PANEL
Cholesterol: 215 mg/dL — ABNORMAL HIGH (ref 0–200)
HDL: 61 mg/dL (ref >40)
LDL Cholesterol: 131 mg/dL — ABNORMAL HIGH (ref 0–99)
Total CHOL/HDL Ratio: 3.5 RATIO
Triglycerides: 117 mg/dL (ref <150)
VLDL: 23 mg/dL (ref 0–40)

## 2019-09-05 LAB — TROPONIN I (HIGH SENSITIVITY): Troponin I (High Sensitivity): 5 ng/L (ref <18)

## 2019-09-05 MED ORDER — PRASUGREL HCL 10 MG PO TABS: 10.0000 mg | ORAL_TABLET | Freq: Every day | ORAL | 6 refills | Status: DC

## 2019-09-05 MED ORDER — ATORVASTATIN CALCIUM 80 MG PO TABS: 80.0000 mg | ORAL_TABLET | Freq: Every day | ORAL | 1 refills | Status: DC

## 2019-09-05 MED ORDER — ISOSORBIDE MONONITRATE ER 30 MG PO TB24: 30.0000 mg | ORAL_TABLET | Freq: Every day | ORAL | 2 refills | Status: DC

## 2019-09-05 NOTE — Telephone Encounter (Signed)
-----   Message from Alver Sorrow, NP sent at 09/05/2019  1:01 PM EDT ----- Troponin normal - no sign that heart is under stress. Good result!  Normal kidney function and electrolytes. Very mildly low calcium, which is stable.   Lipid panel not at goal. Unclear which of his medications he is taking. He has follow up in 1 week and need to bring his pill bottles with him. Recommend Atorvastatin 80mg  daily and Zetia 10mg  daily as previously prescribed.

## 2019-09-05 NOTE — Progress Notes (Signed)
Office Visit    Patient Name: Jeff Rios Date of Encounter: 09/05/2019  Primary Care Provider:  [facility] [place] Primary Cardiologist:  Kathlyn Sacramento, MD Electrophysiologist:  None   Chief Complaint    Jeff Rios is a 54 y.o. male with a hx of CAD with NSTEMI 03/18/2019 s/p PCI/DES to RCA and LAD, HTN, HLD, tobacco abuse presents today for follow up of CAD.    Past Medical History    Past Medical History:  Diagnosis Date  . CAD (coronary artery disease)   . HLD (hyperlipidemia)   . Hypertension    Past Surgical History:  Procedure Laterality Date  . CARDIAC CATHETERIZATION    . CORONARY STENT INTERVENTION N/A 03/18/2019   Procedure: CORONARY STENT INTERVENTION;  Surgeon: Wellington Hampshire, MD;  Location: [facility];  Service: Cardiovascular;  Laterality: N/A;  . CORONARY STENT INTERVENTION N/A 03/20/2019   Procedure: CORONARY STENT INTERVENTION;  Surgeon: Wellington Hampshire, MD;  Location: [facility];  Service: Cardiovascular;  Laterality: N/A;  . CORONARY/GRAFT ACUTE MI REVASCULARIZATION N/A 03/18/2019   Procedure: Coronary/Graft Acute MI Revascularization;  Surgeon: Wellington Hampshire, MD;  Location: [facility];  Service: Cardiovascular;  Laterality: N/A;  . LEFT HEART CATH AND CORONARY ANGIOGRAPHY N/A 03/18/2019   Procedure: LEFT HEART CATH AND CORONARY ANGIOGRAPHY;  Surgeon: Wellington Hampshire, MD;  Location: [facility];  Service: Cardiovascular;  Laterality: N/A;    Allergies  Allergies  Allergen Reactions  . Amlodipine Swelling    Patient states he had an allergic reaction and was possibly from amlodipine. Taken off that and switched to metoprolol.     History of Present Illness    Jeff Rios is a 54 y.o. male with a hx of CAD with NSTEMI 03/18/2019 s/p PCI/DES to RCA and LAD, HTN, HLD, tobacco abuse.  He was last seen 06/07/2019 by Christell Faith, PA.  He was seen in the ED 03/05/2019 with  idiopathic angioedema and treated steroids.  Some concern that was related to amlodipine and it was discontinued and transitioned to metoprolol.  Present in the hospital 03/18/2019 severe substernal chest discomfort woke him from sleep.  EKG with NSR with less than 1 mm ST elevation in lead III withST elevation in other leads with anterolateral T wave inversion in my ST depression in leads I and aVL.  Initial high-sensitivity troponin 59, delta 210, peak 24,643.  Emergent left heart cath showing significant two-vessel CAD with culprit being occluded distal RCA with faint left-to-right collaterals.  Also significant mid LAD stenosis which appeared to be hazy.  LV systolic function was beadlike appearance of the LV and mid cavity gradient suggested a variant of hypertrophic cardiomyopathy was significantly elevated LVEDP in the low 30s.  Underwent successful PCI/DES to distal RCA.  Echo with LVEF 60 to 65%, mild LVH, grade 1 diastolic dysfunction, normal RV systolic function and cavity size, no significant valvular abnormality.  Underwent staged PCI to the mid LAD 03/20/2019.  On review his echo images showed no significant LVH with only mild gradient leading to no further work-up.  ED visit 03/26/2019 with abdominal bruising at site of Lovenox/heparin injections.  Work-up unremarkable with stable CBC.  Seen for follow-up in office 1/20 without any anginal symptoms and amlodipine 5 mg daily was added in setting of mildly elevated BP 140/82.  At last office visit in February his amlodipine was uptitrated to 7.5 mg daily.  Today in clinic he reports a  burning sensation in his chest, dyspnea on exertion, bilateral lower extremity edema. He reports some dyspnea on exertion over the last few weeks. Tells me it isn't often but he does notice it. When he is walking he has to stop to catch his breath. Reports midsternal chest discomfort to the right of chest that feels like burning.  He has not had to use his  nitroglycerin.  Does not add salt to his food.  No formal exercise regimen.  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  He notices lower extremity edema after work.  Works long shift Chief of Staff and spends majority of the time standing.  Does not wear compression stockings.  This does get better after he sits down.  Tells me his 'blood pressure goes up'.  Does not check blood pressure at home and cannot tell me how he notices blood pressure goes up but tells me it is doing so.  Some concern regarding medication compliance.  He tells me he is taking his medications as prescribed but then questions whether he is taking his prasugrel, notes that they are expensive, and mentions taking half tablets of certain medications due to cost.  EKGs/Labs/Other Studies Reviewed:   The following studies were reviewed today: [facility] 2019/03/20:  The left ventricular systolic function is normal.  LV end diastolic pressure is normal.  The left ventricular ejection fraction is 55-65% by visual estimate.  Mid LAD lesion is 80% stenosed.  Prox RCA lesion is 30% stenosed.  Dist RCA lesion is 100% stenosed.  Post intervention, there is a 0% residual stenosis.  A drug-eluting stent was successfully placed using a STENT RESOLUTE ONYX 2.25X22.  RPDA lesion is 40% stenosed.   1.  Significant two-vessel coronary artery disease.  The culprit is an occluded distal right coronary artery with faint left-to-right collaterals.  The supplied area is not large and that is the likely reason for nondiagnostic EKG changes for inferior STEMI.  There is also significant mid LAD stenosis which appears to be hazy. 2.  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  Significantly elevated LVEDP in the low 30s. 3.  Successful angioplasty and drug-eluting stent placement to the distal RCA.     Recommendations: Continue dual antiplatelet therapy for at least 1 year.  Aggressive treatment of risk factors. Recommend staged PCI of the LAD before hospital discharge.  This is planned for Monday morning. __________   2D Echo 03/20/2019:  1. Left ventricular ejection fraction, by visual estimation, is 60 to 65%. The left ventricle has normal function. There is mildly increased left ventricular hypertrophy.  2. Left ventricular diastolic parameters are consistent with Grade I diastolic dysfunction (impaired relaxation).  3. Global right ventricle has normal systolic function.The right ventricular size is normal. No increase in right ventricular wall thickness.  4. The mitral valve is normal in structure. No evidence of mitral valve regurgitation. No evidence of mitral stenosis.  5. The tricuspid valve is normal in structure. Tricuspid valve regurgitation is not demonstrated.  6. The aortic valve is normal in structure. Aortic valve regurgitation is not visualized. No evidence of aortic valve sclerosis or stenosis.  7. The pulmonic valve was normal in structure. Pulmonic valve regurgitation is trivial.  8. TR signal is inadequate for assessing pulmonary artery systolic pressure.  9. The inferior vena cava is normal in size with greater than 50% respiratory variability, suggesting right  atrial pressure of 3 mmHg. __________   PCI 03/20/2019:  RPDA lesion is 40% stenosed.  Prox RCA lesion is 30% stenosed.  Previously placed Dist RCA drug eluting stent is widely patent.  Balloon angioplasty was performed.  Mid LAD lesion is 80% stenosed.  Post intervention, there is a 0% residual stenosis.  A drug-eluting stent was successfully placed using a STENT RESOLUTE ONYX 3.0X15.   Successful angioplasty and drug-eluting stent placement to the mid LAD.   Recommendations: Continue dual antiplatelet therapy for at least 1 year. Aggressive treatment of risk factors. Likely discharge home  tomorrow. He is to stay off work for 2 weeks.   EKG:  EKG is ordered today.  The ekg ordered today demonstrates SR 80 bpm with single PVC and stable later TWI. Possible left atrial enlargement. Stable compared to previous.  Recent Labs: 06/07/2019: ALT 29; BUN 18; Creatinine, Ser 1.06; Hemoglobin 14.3; Platelets 281; Potassium 4.5; Sodium 140  Recent Lipid Panel    Component Value Date/Time   CHOL 200 (H) 06/07/2019 0922   TRIG 80 06/07/2019 0922   HDL 64 06/07/2019 0922   CHOLHDL 3.1 06/07/2019 0922   CHOLHDL 3.9 03/18/2019 0701   VLDL 6 03/18/2019 0701   LDLCALC 122 (H) 06/07/2019 0922    Home Medications   Current Meds  Medication Sig  . amLODipine (NORVASC) 5 MG tablet Take 1.5 tablets (7.5 mg total) by mouth daily.  Marland Kitchen aspirin 81 MG EC tablet Take 1 tablet (81 mg total) by mouth daily.  Marland Kitchen atorvastatin (LIPITOR) 80 MG tablet Take 1 tablet (80 mg total) by mouth daily at 6 PM.  . carvedilol (COREG) 3.125 MG tablet Take 1 tablet (3.125 mg total) by mouth 2 (two) times daily with a meal.  . ezetimibe (ZETIA) 10 MG tablet Take 1 tablet (10 mg total) by mouth daily.  . nitroGLYCERIN (NITROSTAT) 0.4 MG SL tablet Place 1 tablet (0.4 mg total) under the tongue every 5 (five) minutes as needed for chest pain.  . prasugrel (EFFIENT) 10 MG TABS tablet Take 1 tablet (10 mg total) by mouth daily.    Review of Systems      Review of Systems  Constitution: Negative for chills, fever and malaise/fatigue.  Cardiovascular: Positive for chest pain, dyspnea on exertion and leg swelling. Negative for irregular heartbeat, near-syncope, orthopnea, palpitations and syncope.  Respiratory: Negative for cough, shortness of breath and wheezing.   Gastrointestinal: Negative for melena, nausea and vomiting.  Genitourinary: Negative for hematuria.  Neurological: Negative for dizziness, light-headedness and weakness.   All other systems reviewed and are otherwise negative except as noted  above.  Physical Exam    VS:  BP 140/82 (BP Location: Left Arm, Patient Position: Sitting, Cuff Size: Normal)   Ht 5\' 3"  (1.6 m)   Wt 190 lb (86.2 kg)   SpO2 95%   BMI 33.66 kg/m  , BMI Body mass index is 33.66 kg/m. GEN: Well nourished, well developed, in no acute distress. HEENT: normal. Neck: Supple, no JVD, carotid bruits, or masses. Cardiac: RRR, no murmurs, rubs, or gallops. No clubbing, cyanosis, edema.  Radials/PT 2+ and equal bilaterally.  Respiratory:  Respirations regular and unlabored, clear to auscultation bilaterally. GI: Soft, nontender, nondistended, BS + x 4. MS: No deformity or atrophy. Skin: Warm and dry, no rash. Neuro:  Strength and sensation are intact. Psych: Normal affect.   Assessment & Plan    1. CAD - PCI/DES RCA and staged PCI/DES LAD 03/2019. Reports stable anginal symptoms  of dyspnea on exertion with some associated mild right-sided chest tightness.  GDMT includes aspirin/Prasugrel, Metoprolol, Atorvastatin, Zetia, PRN Nitroglycerin. Add Imdur 30mg  daily for anginal symptoms. Stat troponin was normal, no indication for ED evaluation. EKG today without acute ST/T wave changes. No ischemic evaluation at this time, will follow closely and see in office in 1 week to assess response to Imdur. Concern whether he is taking Prasugrel or not - recommended for 12 mos DAPT starting 03/2019 - he will bring medications to clinic visit, refill provided, consider transition to Plavix if more cost effective.   2. LE edema - Echo 03/2019 with normal LVEF. Likely dependent edema, venous insufficiency due to long shifts standing. Unable to exclude Amlodipine as contributory. Reassess BP at follow up and consider reduced dose vs discontinuation. Elevate lower extremities, compression stockings, low salt diet recommended.   3. HTN - BP mildly elevated 140/82. Does not monitor at home. Add Imdur 30mg  daily, as above.   4. HLD, LDL goal <70 -06/07/19 21 normal liver function, LDL  122. Zetia 10mg  was added to Atorvastatin 80mg  daily. Repeat lipid, CMET today. Labs showed normal liver function, LDL significantly elevated 131. Suspect medication noncompliance. He was asked to bring medications to follow up visit.   5. Tobacco use - Continued cessation encouraged. Recommend utilization of 1800QUITNOW.  6. Medication noncompliance/financial difficulties  - Asked to bring medicaiton bottles to next visit. May require future referral to SW. Does get medications at [facility]. Try to consolidate medications at next office visit.   Disposition: Follow up in 1 week(s) with Dr. 08/05/19 or APP.    , NP 09/05/2019, 11:20 AM

## 2019-09-05 NOTE — Patient Instructions (Addendum)
Medication Instructions:  Your physician has recommended you make the following change in your medication:   START Imdur 30mg  daily  Check if you've been taking your Prasugrel (Effient). We have sent a renewed prescription to [facility] for you.   *If you need a refill on your cardiac medications before your next appointment, please call your pharmacy*   Lab Work: Your physician recommends that you return for lab work today: lipid panel, CMET Troponin  If you have labs (blood work) drawn today and your tests are completely normal, you will receive your results only by: [facility] MyChart Message (if you have MyChart) OR . A paper copy in the mail If you have any lab test that is abnormal or we need to change your treatment, we will call you to review the results.  Testing/Procedures: Your EKG today was normal.   Follow-Up: At [facility], you and your health needs are our priority.  As part of our continuing mission to provide you with exceptional heart care, we have created designated Provider Care Teams.  These Care Teams include your primary Cardiologist (physician) and Advanced Practice Providers (APPs -  Physician Assistants and Nurse Practitioners) who all work together to provide you with the care you need, when you need it.  We recommend signing up for the patient portal called "MyChart".  Sign up information is provided on this After Visit Summary.  MyChart is used to connect with patients for Virtual Visits (Telemedicine).  Patients are able to view lab/test results, encounter notes, upcoming appointments, etc.  Non-urgent messages can be sent to your provider as well.   To learn more about what you can do with MyChart, go to [facility].    Your next appointment:   1 week(s)  The format for your next appointment:   In Person  Provider:   You may see [email or web address], MD or one of the following Advanced Practice Providers on your designated Care Team:    Lorine Bears, NP  Nicolasa Ducking, PA-C  Eula Listen, PA-C  Marisue Ivan, NP   Other Instructions  For your leg swelling  Elevate your legs when sitting  Eat low salt diet  Try compression stockings or diabetic socks  Please bring your medication bottles to your next office visit.

## 2019-09-05 NOTE — Telephone Encounter (Signed)
Call to patient to review labs.    Pt verbalized understanding and has no further questions at this time.    Advised pt to call for any further questions or concerns.  No further orders.  Pt will bring medications to next office visit.

## 2019-09-06 ENCOUNTER — Encounter: Payer: Self-pay | Admitting: Family

## 2019-09-12 ENCOUNTER — Ambulatory Visit: Payer: Medicare Other | Admitting: Family

## 2019-09-18 ENCOUNTER — Ambulatory Visit: Payer: Medicare Other | Admitting: Family

## 2019-09-19 ENCOUNTER — Other Ambulatory Visit: Payer: Self-pay

## 2019-09-19 ENCOUNTER — Telehealth: Payer: Self-pay | Admitting: Licensed Clinical Social Worker

## 2019-09-19 ENCOUNTER — Ambulatory Visit (INDEPENDENT_AMBULATORY_CARE_PROVIDER_SITE_OTHER): Payer: Medicare Other | Admitting: Family

## 2019-09-19 ENCOUNTER — Encounter: Payer: Self-pay | Admitting: Family

## 2019-09-19 VITALS — BP 110/80 | HR 72 | Ht 63.0 in | Wt 190.5 lb

## 2019-09-19 DIAGNOSIS — Z9114 Patient's other noncompliance with medication regimen: Secondary | ICD-10-CM | POA: Diagnosis not present

## 2019-09-19 DIAGNOSIS — Z598 Other problems related to housing and economic circumstances: Secondary | ICD-10-CM | POA: Diagnosis not present

## 2019-09-19 DIAGNOSIS — I2510 Atherosclerotic heart disease of native coronary artery without angina pectoris: Secondary | ICD-10-CM

## 2019-09-19 DIAGNOSIS — E785 Hyperlipidemia, unspecified: Secondary | ICD-10-CM

## 2019-09-19 DIAGNOSIS — R600 Localized edema: Secondary | ICD-10-CM

## 2019-09-19 DIAGNOSIS — Z599 Problem related to housing and economic circumstances, unspecified: Secondary | ICD-10-CM

## 2019-09-19 MED ORDER — ATORVASTATIN CALCIUM 80 MG PO TABS: 80.0000 mg | ORAL_TABLET | Freq: Every day | ORAL | 1 refills | Status: DC

## 2019-09-19 NOTE — Patient Instructions (Addendum)
Medication Instructions:   STOP Zetia 10mg  daily  You have not picked this up recently from the pharmacy. We will start by taking your Atorvastatin (your other cholesterol medication) daily and seeing how your cholesterol does.   ON June 3rd. We will send these prescriptions and updates to the pharmacy on June 1st so that they are available for you to pick up.   STOP Amlodipine 7.5mg  daily  INCREASE Imdur to 60mg  daily   STOP Prasugrel (Effient)  START Clopidogrel (Plavix) 75mg  daily  *If you need a refill on your cardiac medications before your next appointment, please call your pharmacy*  Lab Work: No lab work today. Your recent lab work showed your cholesterol levels were a bit high.   Testing/Procedures: None ordered today.   Follow-Up: At [facility], you and your health needs are our priority.  As part of our continuing mission to provide you with exceptional heart care, we have created designated Provider Care Teams.  These Care Teams include your primary Cardiologist (physician) and Advanced Practice Providers (APPs -  Physician Assistants and Nurse Practitioners) who all work together to provide you with the care you need, when you need it.  We recommend signing up for the patient portal called "MyChart".  Sign up information is provided on this After Visit Summary.  MyChart is used to connect with patients for Virtual Visits (Telemedicine).  Patients are able to view lab/test results, encounter notes, upcoming appointments, etc.  Non-urgent messages can be sent to your provider as well.   To learn more about what you can do with MyChart, go to .    Your next appointment:   1 month(s)  The format for your next appointment:   In Person  Provider:   You may see , MD or one of the following Advanced Practice Providers on your designated Care Team:    [facility], NP  [email or web address], PA-C  Lorine Bears, PA-C  Other  Instructions  We will mail you a blood pressure cuff. Your blood pressure should be 110-135/60-90. If your blood pressure is always more than 135 for the top number, call our office.

## 2019-09-19 NOTE — Telephone Encounter (Signed)
CSW referred to assist patient with obtaining a BP cuff. CSW contacted patient to inform cuff will be delivered to home. Patient grateful for support and assistance. CSW available as needed. Jackie Brennan, LCSW, CCSW-MCS [phone]  

## 2019-09-19 NOTE — Progress Notes (Signed)
Office Visit    Patient Name: Jeff Rios Date of Encounter: 09/19/2019  Primary Care Provider:  Center, [facility] Primary Cardiologist:  Lorine Bears, MD Electrophysiologist:  None   Chief Complaint    Jeff Rios is a 54 y.o. male with a hx of CAD with NSTEMI 03/18/2019 s/p PCI/DES to RCA and LAD, HTN, HLD, tobacco abuse presents today for follow up after addition of Imdur.    Past Medical History    Past Medical History:  Diagnosis Date  . CAD (coronary artery disease)   . HLD (hyperlipidemia)   . Hypertension    Past Surgical History:  Procedure Laterality Date  . CARDIAC CATHETERIZATION    . CORONARY STENT INTERVENTION N/A 03/18/2019   Procedure: CORONARY STENT INTERVENTION;  Surgeon: Iran Ouch, MD;  Location: [facility] INVASIVE CV LAB;  Service: Cardiovascular;  Laterality: N/A;  . CORONARY STENT INTERVENTION N/A 03/20/2019   Procedure: CORONARY STENT INTERVENTION;  Surgeon: Iran Ouch, MD;  Location: [facility] INVASIVE CV LAB;  Service: Cardiovascular;  Laterality: N/A;  . CORONARY/GRAFT ACUTE MI REVASCULARIZATION N/A 03/18/2019   Procedure: Coronary/Graft Acute MI Revascularization;  Surgeon: Iran Ouch, MD;  Location: [facility] INVASIVE CV LAB;  Service: Cardiovascular;  Laterality: N/A;  . LEFT HEART CATH AND CORONARY ANGIOGRAPHY N/A 03/18/2019   Procedure: LEFT HEART CATH AND CORONARY ANGIOGRAPHY;  Surgeon: Iran Ouch, MD;  Location: [facility] INVASIVE CV LAB;  Service: Cardiovascular;  Laterality: N/A;    Allergies  Allergies  Allergen Reactions  . Amlodipine Swelling    Patient states he had an allergic reaction and was possibly from amlodipine. Taken off that and switched to metoprolol.     History of Present Illness    Jeff Rios is a 54 y.o. male with a hx of CAD with NSTEMI 03/18/2019 s/p PCI/DES to RCA and LAD, HTN, HLD, tobacco abuse.  He was last seen 09/05/19.  He was seen in the ED 03/05/2019 with  idiopathic angioedema and treated steroids.  Some concern that was related to amlodipine and it was discontinued and transitioned to metoprolol.  Present in the hospital 03/18/2019 severe substernal chest discomfort woke him from sleep.  EKG with NSR with less than 1 mm ST elevation in lead III withST elevation in other leads with anterolateral T wave inversion in my ST depression in leads I and aVL.  Initial high-sensitivity troponin 59, delta 210, peak 24,643.  Emergent left heart cath showing significant two-vessel CAD with culprit being occluded distal RCA with faint left-to-right collaterals.  Also significant mid LAD stenosis which appeared to be hazy.  LV systolic function was beadlike appearance of the LV and mid cavity gradient suggested a variant of hypertrophic cardiomyopathy was significantly elevated LVEDP in the low 30s.  Underwent successful PCI/DES to distal RCA.  Echo with LVEF 60 to 65%, mild LVH, grade 1 diastolic dysfunction, normal RV systolic function and cavity size, no significant valvular abnormality.  Underwent staged PCI to the mid LAD 03/20/2019.  On review his echo images showed no significant LVH with only mild gradient leading to no further work-up.  ED visit 03/26/2019 with abdominal bruising at site of Lovenox/heparin injections.  Work-up unremarkable with stable CBC.  Seen for follow-up in office 1/20 without any anginal symptoms and amlodipine 5 mg daily was added in setting of mildly elevated BP 140/82.  At office visit in February his amlodipine was uptitrated to 7.5 mg daily.  Clinic visit 09/05/19 reported a burning sensation in his chest,  dyspnea on exertion, bilateral lower extremity edema.  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  He reported that his blood pressure went up but was not checking at home and could not describe symptoms that made him nervous blood pressure was up.  There was also  noted concern regarding medication compliance.  Imdur 30 mg daily was added, stat troponin was normal, and was asked to bring medication bottles to next visit.  Tells me his dyspnea on exertion is improving.  Reports improvement in his lower extremity edema since elevating his legs.  Reports no recurrent chest pain, pressure, tightness.  Does continue to and endorses concern regarding the cost of his medications and that he occasionally has to "break them down "until he can afford the next refill.    Did call [facility] to confirm which of his medications he had picked up recently.  He has not refilled Zetia since 06/2019.  Shares with me during office visit that he occasionally forgets his atorvastatin as he was told to take it in the evening.  We discussed taking it in the morning and the order to get a dose regularly.  [facility] was able to get him his new prescription for Imdur 30 mg daily at $10 for 90-day supply using good Rx coupon.  In comparison, his amlodipine is $9 for 30-day supply.  Prasugrel was $25 for 30-day supply.  EKGs/Labs/Other Studies Reviewed:   The following studies were reviewed today:  LHC 03/18/2019:  The left ventricular systolic function is normal.  LV end diastolic pressure is normal.  The left ventricular ejection fraction is 55-65% by visual estimate.  Mid LAD lesion is 80% stenosed.  Prox RCA lesion is 30% stenosed.  Dist RCA lesion is 100% stenosed.  Post intervention, there is a 0% residual stenosis.  A drug-eluting stent was successfully placed using a STENT RESOLUTE ONYX 2.25X22.  RPDA lesion is 40% stenosed.   1.  Significant two-vessel coronary artery disease.  The culprit is an occluded distal right coronary artery with faint left-to-right collaterals.  The supplied area is not large and that is the likely reason for nondiagnostic EKG changes for inferior STEMI.  There is also significant mid LAD stenosis which appears to be hazy. 2.  Normal LV  systolic function with Spade-like appearance of the left ventricle and mid cavity gradient suggestive of variant form of hypertrophic cardiomyopathy.  Significantly elevated LVEDP in the low 30s. 3.  Successful angioplasty and drug-eluting stent placement to the distal RCA.   Recommendations: Continue dual antiplatelet therapy for at least 1 year.  Aggressive treatment of risk factors. Recommend staged PCI of the LAD before hospital discharge.  This is planned for Monday morning. __________   2D Echo 03/18/2019:  1. Left ventricular ejection fraction, by visual estimation, is 60 to 65%. The left ventricle has normal function. There is mildly increased left ventricular hypertrophy.  2. Left ventricular diastolic parameters are consistent with Grade I diastolic dysfunction (impaired relaxation).  3. Global right ventricle has normal systolic function.The right ventricular size is normal. No increase in right ventricular wall thickness.  4. The mitral valve is normal in structure. No evidence of mitral valve regurgitation. No evidence of mitral stenosis.  5. The tricuspid valve is normal in structure. Tricuspid valve regurgitation is not demonstrated.  6. The aortic valve is normal in structure. Aortic valve regurgitation is not visualized. No evidence of aortic valve sclerosis or  stenosis.  7. The pulmonic valve was normal in structure. Pulmonic valve regurgitation is trivial.  8. TR signal is inadequate for assessing pulmonary artery systolic pressure.  9. The inferior vena cava is normal in size with greater than 50% respiratory variability, suggesting right atrial pressure of 3 mmHg. __________   PCI 03/20/2019:  RPDA lesion is 40% stenosed.  Prox RCA lesion is 30% stenosed.  Previously placed Dist RCA drug eluting stent is widely patent.  Balloon angioplasty was performed.  Mid LAD lesion is 80% stenosed.  Post intervention, there is a 0% residual stenosis.  A drug-eluting stent  was successfully placed using a STENT RESOLUTE ONYX 3.0X15.   Successful angioplasty and drug-eluting stent placement to the mid LAD.   Recommendations: Continue dual antiplatelet therapy for at least 1 year. Aggressive treatment of risk factors. Likely discharge home tomorrow. He is to stay off work for 2 weeks.   EKG:  No EKG today.   Recent Labs: 06/07/2019: Hemoglobin 14.3; Platelets 281 09/05/2019: ALT 34; BUN 12; Creatinine, Ser 1.06; Potassium 4.1; Sodium 136  Recent Lipid Panel    Component Value Date/Time   CHOL 215 (H) 09/05/2019 1221   CHOL 200 (H) 06/07/2019 0922   TRIG 117 09/05/2019 1221   HDL 61 09/05/2019 1221   HDL 64 06/07/2019 0922   CHOLHDL 3.5 09/05/2019 1221   VLDL 23 09/05/2019 1221   LDLCALC 131 (H) 09/05/2019 1221   LDLCALC 122 (H) 06/07/2019 0922   Home Medications   Current Meds  Medication Sig  . amLODipine (NORVASC) 5 MG tablet Take 1.5 tablets (7.5 mg total) by mouth daily.  Marland Kitchen aspirin 81 MG EC tablet Take 1 tablet (81 mg total) by mouth daily.  Marland Kitchen atorvastatin (LIPITOR) 80 MG tablet Take 1 tablet (80 mg total) by mouth daily at 6 PM.  . carvedilol (COREG) 3.125 MG tablet Take 1 tablet (3.125 mg total) by mouth 2 (two) times daily with a meal.  . ezetimibe (ZETIA) 10 MG tablet Take 1 tablet (10 mg total) by mouth daily.  . isosorbide mononitrate (IMDUR) 30 MG 24 hr tablet Take 1 tablet (30 mg total) by mouth daily.  . nitroGLYCERIN (NITROSTAT) 0.4 MG SL tablet Place 1 tablet (0.4 mg total) under the tongue every 5 (five) minutes as needed for chest pain.  . prasugrel (EFFIENT) 10 MG TABS tablet Take 1 tablet (10 mg total) by mouth daily.    Review of Systems      Review of Systems  Constitution: Negative for chills, fever and malaise/fatigue.  Cardiovascular: Positive for chest pain, dyspnea on exertion and leg swelling. Negative for irregular heartbeat, near-syncope, orthopnea, palpitations and syncope.  Respiratory: Negative for cough,  shortness of breath and wheezing.   Gastrointestinal: Negative for melena, nausea and vomiting.  Genitourinary: Negative for hematuria.  Neurological: Negative for dizziness, light-headedness and weakness.   All other systems reviewed and are otherwise negative except as noted above.  Physical Exam    VS:  BP 110/80 (BP Location: Left Arm, Patient Position: Sitting, Cuff Size: Normal)   Pulse 72   Ht 5\' 3"  (1.6 m)   Wt 190 lb 8 oz (86.4 kg)   SpO2 95%   BMI 33.75 kg/m  , BMI Body mass index is 33.75 kg/m. GEN: Well nourished, well developed, in no acute distress. HEENT: normal. Neck: Supple, no JVD, carotid bruits, or masses. Cardiac: RRR, no murmurs, rubs, or gallops. No clubbing, cyanosis, edema.  Radials/PT 2+ and equal bilaterally.  Respiratory:  Respirations regular and unlabored, clear to auscultation bilaterally. GI: Soft, nontender, nondistended, BS + x 4. MS: No deformity or atrophy. Skin: Warm and dry, no rash. Neuro:  Strength and sensation are intact. Psych: Normal affect.   Assessment & Plan    1. CAD - No recurrent chest pain since addition of Imdur at last clinic visit. PCI/DES RCA and staged PCI/DES LAD 03/2019. Reports stable anginal symptoms of dyspnea on exertion with some associated mild right-sided chest tightness.  Recommended for 12 mos DAPT starting 03/2019 but having difficulty affording Prasugrel. He will finish current supply, starting 10/05/19 stop Prasugrel, start Plavix. Present GDMT includes Aspirin (3 month supply provided in clinic today), Prasugrel (to be transitioned to Plavix), Coreg, Atorvastatin, Imdur, PRN Nitroglycerin.   2. LE edema - Echo 03/2019 with normal LVEF. Likely dependent edema, venous insufficiency due to long shifts standing. Unable to exclude Amlodipine as contributory.  Reports this is improved since elevating his legs.  Elevate lower extremities, compression stockings, low salt diet recommended.  Plan to discontinue amlodipine, as  below.  3. HTN -BP improved after addition of Imdur.  Does not monitor home.  Will ask social work team to send him BP cuff for home monitoring.  Continue present antihypertensive regimen until he uses up his current supply of medications. Due to concerns related to cost and compliance,  starting 10/05/2019 we will stop amlodipine, increase Imdur to 60 mg daily, continue Coreg 3.125 twice daily. Staff message sent to SW to request BP cuff to be mailed to his home.   4. HLD, LDL goal <70 -06/07/19 21 normal liver function, LDL 122. Zetia 10mg  was added to Atorvastatin 80mg  daily. 09/05/19 normal liver function, LDL 131. Endorses missing a few doses because the evening dosing of Atorvastatin is hard.  Instructed to take atorvastatin 80 mg daily in the morning with his other medications.  [facility], he has not picked up Zetia since 06/12/2019. Will defer re-initiation at this time and reassess lipid/liver after 6 weeks of taking Atorvastatin regularly.   5. Tobacco use - Tells me he hasn't smoked in a couple months. Continued cessation encouraged. Recommend utilization of [id number].  6. Medication noncompliance/financial difficulties  - Asked to bring medicaiton bottles to this visit, but he forgot. Gets medications at [facility] with GoodRx coupon but reports difficulty in affording. Working to consolidate medications and make them more affordable, as above. Sent staff message to SW for any additional recommendations.  Disposition: Medication changes delayed until 10/05/19 to allow him to use up current supply of medications. Medications to be changes for financial constraint. Will send in new Rx and ask nursing to call him to confirm 10/03/19. Hesitant to send Rx on day of visit to avoid confusion. Follow up in 1 month(s) with Dr. 12/05/19 or APP.    12/03/19, NP 09/19/2019, 8:52 AM

## 2019-09-20 ENCOUNTER — Telehealth: Payer: Self-pay | Admitting: Licensed Clinical Social Worker

## 2019-09-20 ENCOUNTER — Ambulatory Visit: Payer: Medicare Other | Admitting: Family

## 2019-09-20 NOTE — Telephone Encounter (Signed)
CSW consulted for medication cost concerns.  CSW attempted to call pt to discuss- unable to leave message- will attempt to contact again later.  Also called pt pharmacy and confirmed pt does not have prescription coverage at this time but is already utilizing coupon cards for some of his medications.  Will see what other assistance can be provided after speaking with the pt.  Burna Sis, LCSW Clinical Social Worker [facility] Desk#: [phone] Cell#: [phone]

## 2019-09-26 ENCOUNTER — Telehealth: Payer: Self-pay | Admitting: Licensed Clinical Social Worker

## 2019-09-26 NOTE — Telephone Encounter (Signed)
Thank you so much for working with him. I really appreciate it!  Alver Sorrow, NP

## 2019-09-26 NOTE — Telephone Encounter (Signed)
CSW reached out to pt again to check in regarding medication cost concerns.  Pt does not have insurance coverage and only uses coupon cards- states that sometimes the cumulative cost of his medications is close to $100.  CSW researched cost of medications at different stores but there are only negligible differences in cost at other pharmacies.  Pt reports he has been able to get all his medications at this time but it is sometimes a struggle.  CSW discussed pt need to get Medicare part D- encouraged him to call [employer] to inquire about signing up and see if he needs to wait till open enrollment or if he is eligible to sign up now.    In the meantime I am sending him $50 in [facility] giftcards to help alleviate some of the financial strain.  Will continue to follow and assist as needed  Burna Sis, LCSW Clinical Social Worker [facility] Desk#: [phone] Cell#: [phone]

## 2019-10-03 ENCOUNTER — Telehealth: Payer: Self-pay | Admitting: Family

## 2019-10-03 ENCOUNTER — Encounter: Payer: Self-pay | Admitting: Family

## 2019-10-03 DIAGNOSIS — I10 Essential (primary) hypertension: Secondary | ICD-10-CM

## 2019-10-03 DIAGNOSIS — I2510 Atherosclerotic heart disease of native coronary artery without angina pectoris: Secondary | ICD-10-CM

## 2019-10-03 MED ORDER — CLOPIDOGREL BISULFATE 75 MG PO TABS: 75.0000 mg | ORAL_TABLET | Freq: Every day | ORAL | 1 refills | Status: DC

## 2019-10-03 MED ORDER — ISOSORBIDE MONONITRATE ER 60 MG PO TB24: 60.0000 mg | ORAL_TABLET | Freq: Every day | ORAL | 1 refills | Status: DC

## 2019-10-03 NOTE — Telephone Encounter (Signed)
Recent office visit 09/19/19 with noted difficulties with medications. Social work team has since sent him a Copy and provided education about enrolling in Medicare part D - appreciative of their input.   He had just picked up his medications when last seen so medication changes were deferred. He was interested in simplifying his medication regimen and reducing costs.   At last clinic visit 09/19/19 his AVS listed instructions as below:   "STOP Zetia 10mg  daily  You have not picked this up recently from the pharmacy. We will start by taking your Atorvastatin (your other cholesterol medication) daily and seeing how your cholesterol does.   ON June 3rd. We will send these prescriptions and updates to the pharmacy on June 1st so that they are available for you to pick up.   STOP Amlodipine 7.5mg  daily  INCREASE Imdur to 60mg  daily   STOP Prasugrel (Effient)  START Clopidogrel (Plavix) 75mg  daily"  These medications were sent to his pharmacy today. Spoke with him today to review medication changes. He does still have his most recent AVS at home and will review, will mail him an updated medication list as well.   08-24-1999, NP

## 2019-10-24 ENCOUNTER — Encounter: Payer: Self-pay | Admitting: Family

## 2019-10-24 ENCOUNTER — Ambulatory Visit (INDEPENDENT_AMBULATORY_CARE_PROVIDER_SITE_OTHER): Payer: Medicare Other | Admitting: Family

## 2019-10-24 ENCOUNTER — Other Ambulatory Visit: Payer: Self-pay

## 2019-10-24 VITALS — BP 130/86 | HR 62 | Ht 63.0 in | Wt 193.0 lb

## 2019-10-24 DIAGNOSIS — R600 Localized edema: Secondary | ICD-10-CM | POA: Diagnosis not present

## 2019-10-24 DIAGNOSIS — E785 Hyperlipidemia, unspecified: Secondary | ICD-10-CM | POA: Diagnosis not present

## 2019-10-24 DIAGNOSIS — I10 Essential (primary) hypertension: Secondary | ICD-10-CM

## 2019-10-24 DIAGNOSIS — Z9114 Patient's other noncompliance with medication regimen: Secondary | ICD-10-CM

## 2019-10-24 DIAGNOSIS — I2510 Atherosclerotic heart disease of native coronary artery without angina pectoris: Secondary | ICD-10-CM

## 2019-10-24 DIAGNOSIS — Z599 Problem related to housing and economic circumstances, unspecified: Secondary | ICD-10-CM

## 2019-10-24 DIAGNOSIS — Z87891 Personal history of nicotine dependence: Secondary | ICD-10-CM

## 2019-10-24 DIAGNOSIS — Z598 Other problems related to housing and economic circumstances: Secondary | ICD-10-CM

## 2019-10-24 NOTE — Progress Notes (Signed)
Office Visit    Patient Name: Jeff Rios Date of Encounter: 10/24/2019  Primary Care Provider:  Center, [facility] Primary Cardiologist:  Lorine Bears, MD Electrophysiologist:  None   Chief Complaint    Jeff Rios is a 54 y.o. male with a hx of CAD with NSTEMI 03/18/2019 s/p PCI/DES to RCA and LAD, HTN, HLD, tobacco abuse presents today for follow up  After medication changes.  Past Medical History    Past Medical History:  Diagnosis Date  . CAD (coronary artery disease)   . HLD (hyperlipidemia)   . Hypertension    Past Surgical History:  Procedure Laterality Date  . CARDIAC CATHETERIZATION    . CORONARY STENT INTERVENTION N/A 03/18/2019   Procedure: CORONARY STENT INTERVENTION;  Surgeon: Iran Ouch, MD;  Location: [facility] INVASIVE CV LAB;  Service: Cardiovascular;  Laterality: N/A;  . CORONARY STENT INTERVENTION N/A 03/20/2019   Procedure: CORONARY STENT INTERVENTION;  Surgeon: Iran Ouch, MD;  Location: [facility] INVASIVE CV LAB;  Service: Cardiovascular;  Laterality: N/A;  . CORONARY/GRAFT ACUTE MI REVASCULARIZATION N/A 03/18/2019   Procedure: Coronary/Graft Acute MI Revascularization;  Surgeon: Iran Ouch, MD;  Location: [facility] INVASIVE CV LAB;  Service: Cardiovascular;  Laterality: N/A;  . LEFT HEART CATH AND CORONARY ANGIOGRAPHY N/A 03/18/2019   Procedure: LEFT HEART CATH AND CORONARY ANGIOGRAPHY;  Surgeon: Iran Ouch, MD;  Location: [facility] INVASIVE CV LAB;  Service: Cardiovascular;  Laterality: N/A;    Allergies  Allergies  Allergen Reactions  . Amlodipine Swelling    Patient states he had an allergic reaction and was possibly from amlodipine. Taken off that and switched to metoprolol.     History of Present Illness    Jeff Rios is a 54 y.o. male with a hx of CAD with NSTEMI 03/18/2019 s/p PCI/DES to RCA and LAD, HTN, HLD, tobacco abuse.  He was last seen 09/19/2019  He was seen in the ED 03/05/2019 with  idiopathic angioedema and treated steroids.  Some concern that was related to amlodipine and it was discontinued and transitioned to metoprolol.  Present in the hospital 03/18/2019 severe substernal chest discomfort woke him from sleep.  EKG with NSR with less than 1 mm ST elevation in lead III withST elevation in other leads with anterolateral T wave inversion in my ST depression in leads I and aVL.  Initial high-sensitivity troponin 59, delta 210, peak 24,643.  Emergent left heart cath showing significant two-vessel CAD with culprit being occluded distal RCA with faint left-to-right collaterals.  Also significant mid LAD stenosis which appeared to be hazy.  LV systolic function was beadlike appearance of the LV and mid cavity gradient suggested a variant of hypertrophic cardiomyopathy was significantly elevated LVEDP in the low 30s.  Underwent successful PCI/DES to distal RCA.  Echo with LVEF 60 to 65%, mild LVH, grade 1 diastolic dysfunction, normal RV systolic function and cavity size, no significant valvular abnormality.  Underwent staged PCI to the mid LAD 03/20/2019.  On review his echo images showed no significant LVH with only mild gradient leading to no further work-up.  ED visit 03/26/2019 with abdominal bruising at site of Lovenox/heparin injections.  Work-up unremarkable with stable CBC.  Seen for follow-up in office 1/20 without any anginal symptoms and amlodipine 5 mg daily was added in setting of mildly elevated BP 140/82.  At office visit in February his amlodipine was uptitrated to 7.5 mg daily.  At clinic visits 09/05/2019 and 09/19/2019 multiple efforts have been made to  consolidate his medication regimen as he was having multiple issues with cost.  Social work assistance appreciated as they were able to get him a [employer] gift card.  His amlodipine, Zetia were discontinued due to cost and as he was not presently taking.  His antihypertensive regimen was consolidated to Imdur as with good Rx  coupon it was $10 for 90-day supply.  He was not taking his atorvastatin and this was resumed.  His prasugrel was changed to clopidogrel.  On clinic visit today he reports sometimes he feels short of breath and that he needs to take a deep breath when sitting still.  This is overall stable.  No new dyspnea on exertion.  Continues to walk 30 minutes to work without difficulty.  Reports he feels his blood pressure is elevated sometimes.  Does not check at home but just tells me it feels elevated.    Again reports difficulties with his medications.  He is unsure if he received the [employer] gift card in the mail but will check his mailbox.  He again asks for help with affording his medications.  In calling the pharmacy he has not picked up any of the new prescriptions that were sent in 10/03/2019.  We reviewed that the new prescriptions were sent in in order to be cost effective.  EKGs/Labs/Other Studies Reviewed:   The following studies were reviewed today:  LHC 03/18/2019:  The left ventricular systolic function is normal.  LV end diastolic pressure is normal.  The left ventricular ejection fraction is 55-65% by visual estimate.  Mid LAD lesion is 80% stenosed.  Prox RCA lesion is 30% stenosed.  Dist RCA lesion is 100% stenosed.  Post intervention, there is a 0% residual stenosis.  A drug-eluting stent was successfully placed using a STENT RESOLUTE ONYX 2.25X22.  RPDA lesion is 40% stenosed.   1.  Significant two-vessel coronary artery disease.  The culprit is an occluded distal right coronary artery with faint left-to-right collaterals.  The supplied area is not large and that is the likely reason for nondiagnostic EKG changes for inferior STEMI.  There is also significant mid LAD stenosis which appears to be hazy. 2.  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   Significantly elevated LVEDP in the low 30s. 3.  Successful angioplasty and drug-eluting stent placement to the distal RCA.   Recommendations: Continue dual antiplatelet therapy for at least 1 year.  Aggressive treatment of risk factors. Recommend staged PCI of the LAD before hospital discharge.  This is planned for Monday morning. __________   2D Echo 03/18/2019:  1. Left ventricular ejection fraction, by visual estimation, is 60 to 65%. The left ventricle has normal function. There is mildly increased left ventricular hypertrophy.  2. Left ventricular diastolic parameters are consistent with Grade I diastolic dysfunction (impaired relaxation).  3. Global right ventricle has normal systolic function.The right ventricular size is normal. No increase in right ventricular wall thickness.  4. The mitral valve is normal in structure. No evidence of mitral valve regurgitation. No evidence of mitral stenosis.  5. The tricuspid valve is normal in structure. Tricuspid valve regurgitation is not demonstrated.  6. The aortic valve is normal in structure. Aortic valve regurgitation is not visualized. No evidence of aortic valve sclerosis or stenosis.  7. The pulmonic valve was normal in structure. Pulmonic valve regurgitation is trivial.  8. TR signal is inadequate for assessing pulmonary  artery systolic pressure.  9. The inferior vena cava is normal in size with greater than 50% respiratory variability, suggesting right atrial pressure of 3 mmHg. __________   PCI 03/20/2019:  RPDA lesion is 40% stenosed.  Prox RCA lesion is 30% stenosed.  Previously placed Dist RCA drug eluting stent is widely patent.  Balloon angioplasty was performed.  Mid LAD lesion is 80% stenosed.  Post intervention, there is a 0% residual stenosis.  A drug-eluting stent was successfully placed using a STENT RESOLUTE ONYX 3.0X15.   Successful angioplasty and drug-eluting stent placement to the mid LAD.     Recommendations: Continue dual antiplatelet therapy for at least 1 year. Aggressive treatment of risk factors. Likely discharge home tomorrow. He is to stay off work for 2 weeks.  EKG: EKG ordered today demonstrates NSR 60 bpm with stable T wave inversion in lateral leads.  Recent Labs: 06/07/2019: Hemoglobin 14.3; Platelets 281 09/05/2019: ALT 34; BUN 12; Creatinine, Ser 1.06; Potassium 4.1; Sodium 136  Recent Lipid Panel    Component Value Date/Time   CHOL 215 (H) 09/05/2019 1221   CHOL 200 (H) 06/07/2019 0922   TRIG 117 09/05/2019 1221   HDL 61 09/05/2019 1221   HDL 64 06/07/2019 0922   CHOLHDL 3.5 09/05/2019 1221   VLDL 23 09/05/2019 1221   LDLCALC 131 (H) 09/05/2019 1221   LDLCALC 122 (H) 06/07/2019 0922   Home Medications   Current Meds  Medication Sig  . aspirin 81 MG EC tablet Take 1 tablet (81 mg total) by mouth daily.  Marland Kitchen atorvastatin (LIPITOR) 80 MG tablet Take 1 tablet (80 mg total) by mouth daily.  . carvedilol (COREG) 3.125 MG tablet Take 1 tablet (3.125 mg total) by mouth 2 (two) times daily with a meal.  . clopidogrel (PLAVIX) 75 MG tablet Take 1 tablet (75 mg total) by mouth daily.  . isosorbide mononitrate (IMDUR) 60 MG 24 hr tablet Take 1 tablet (60 mg total) by mouth daily.  . nitroGLYCERIN (NITROSTAT) 0.4 MG SL tablet Place 1 tablet (0.4 mg total) under the tongue every 5 (five) minutes as needed for chest pain.    Review of Systems      Review of Systems  Constitutional: Negative for chills, fever and malaise/fatigue.  Cardiovascular: Negative for chest pain, dyspnea on exertion, irregular heartbeat, leg swelling, near-syncope, orthopnea, palpitations and syncope.  Respiratory: Positive for shortness of breath ("need a deep breath sometimes"). Negative for cough and wheezing.   Gastrointestinal: Negative for melena, nausea and vomiting.  Genitourinary: Negative for hematuria.  Neurological: Negative for dizziness, light-headedness and weakness.   All  other systems reviewed and are otherwise negative except as noted above.  Physical Exam    VS:  BP 130/86 (BP Location: Left Arm, Patient Position: Sitting, Cuff Size: Normal)   Pulse 62   Ht 5\' 3"  (1.6 m)   Wt 193 lb (87.5 kg)   SpO2 97%   BMI 34.19 kg/m  , BMI Body mass index is 34.19 kg/m. GEN: Well nourished, overweight, well developed, in no acute distress. HEENT: normal. Neck: Supple, no JVD, carotid bruits, or masses. Cardiac: RRR, no murmurs, rubs, or gallops. No clubbing, cyanosis. Trace bilateral LE edema.  Radials/PT 2+ and equal bilaterally.  Respiratory:  Respirations regular and unlabored, clear to auscultation bilaterally. GI: Soft, nontender, nondistended, BS + x 4. MS: No deformity or atrophy. Skin: Warm and dry, no rash. Neuro:  Strength and sensation are intact. Psych: Normal affect.   Assessment & Plan  1. CAD -  PCI/DES RCA and staged PCI/DES LAD 03/2019. Recommended for 12 mos DAPT starting 03/2019.  Possible transition to Plavix due to cost but he has not yet picked up the pharmacy. Present GDMT includes Aspirin (3 month supply provided in clinic today), Plavix, Coreg, Atorvastatin, Imdur, PRN Nitroglycerin.   2. LE edema - Echo 03/2019 with normal LVEF. Likely dependent edema, venous insufficiency due to long shifts standing. Reports this is improved since elevating his legs.  Elevate lower extremities, compression stockings, low salt diet recommended.  His amlodipine has been previously discontinued as it was possibly contributory and also cost prohibitive.  3. HTN -BP borderline in clinic.  Reports he feels his blood pressure is elevated at home but does not check.  He has previously been recommended to increase Imdur from 30 mg to 60 mg but has not yet picked up his new prescription or increased his dose.  Education provided.  Social work team has previously sent him BP cuff for home monitoring, but he has not been using. Due to concerns related to cost and  compliance, his antihypertensive regimen has been consolidated to include Coreg and Imdur. Encouraged to pick up Imdur 60mg  as previously prescribed.   4. HLD, LDL goal <70 -06/07/19 normal liver function, LDL 122. Zetia 10mg  was added to Atorvastatin 80mg  daily.  However, he picked up Zetia one time and did not refill due to cost.  09/05/19 normal liver function, LDL 131.  Endorses missing doses of atorvastatin as the evening dosage was difficult and encouraged to take regularly in the morning.  Per pharmacy has not picked up his Atorvastatin since last sent.  Reviewed importance of high potency statin in setting of recent DES. Pharmacy has filled and he tells me he will pick it up. Will need reassessment of lipid/liver function after 6-8 weeks of taking consistently.   5. Tobacco use - Tells me he hasn't smoked in a couple months. Continued cessation encouraged. Recommend utilization of 1800QUITNOW.  6. Medication noncompliance/financial difficulties  - Asked to bring medicaiton bottles to this visit, but he has not for last three visits. Gets medications [facility] with GoodRx coupon but reports difficulty in affording. Medications have been consolidated, as above. SW has sent BP cuff to home and $50 [employer] giftcard.   Disposition: Follow up up in 3 month(s) with Dr. or APP.    11/05/19, NP 10/24/2019, 9:32 AM

## 2019-10-24 NOTE — Patient Instructions (Addendum)
Medication Instructions:   Please pick up your medications at the pharmacy. We have made adjustments to make them more affordable.   Check your mailbox for the [employer] giftcard.   Look at your most recent med list and take only those.  A stronger Isosorbide (Imdur) 60mg  is at the pharmacy to help with your blood pressure. You picked up 90 tablets of Imdur 30mg  on 09/05/19 - you may use these up by taking 2 tablets each morning.   *If you need a refill on your cardiac medications before your next appointment, please call your pharmacy*  Lab Work: None ordered today  Testing/Procedures: Your EKG today showed normal sinus rhythm and was stable compared to previous.  Follow-Up: At [facility], you and your health needs are our priority.  As part of our continuing mission to provide you with exceptional heart care, we have created designated Provider Care Teams.  These Care Teams include your primary Cardiologist (physician) and Advanced Practice Providers (APPs -  Physician Assistants and Nurse Practitioners) who all work together to provide you with the care you need, when you need it.  We recommend signing up for the patient portal called "MyChart".  Sign up information is provided on this After Visit Summary.  MyChart is used to connect with patients for Virtual Visits (Telemedicine).  Patients are able to view lab/test results, encounter notes, upcoming appointments, etc.  Non-urgent messages can be sent to your provider as well.   To learn more about what you can do with MyChart, go to 11/05/19.    Your next appointment:   In September   The format for your next appointment:   In Person  Provider:    Please see [email or web address], MD   Other Instructions   Recommend wearing compression stockings to help with your swelling.  Recommend keeping your feet up when you are sitting down to help with swelling.   Check your mailbox for the $50 [employer] card that was sent  at the end of May.  We will reach out to our social work team about additional assistance with your medications.

## 2019-10-26 ENCOUNTER — Telehealth: Payer: Self-pay | Admitting: Cardiovascular Disease

## 2019-10-26 DIAGNOSIS — I10 Essential (primary) hypertension: Secondary | ICD-10-CM

## 2019-10-26 DIAGNOSIS — I2510 Atherosclerotic heart disease of native coronary artery without angina pectoris: Secondary | ICD-10-CM

## 2019-10-26 NOTE — Telephone Encounter (Signed)
Patient is calling in stating his head has been hurting since he started new medication on Tuesday, patient states he does not know the names (there are 3)  Please advise

## 2019-10-26 NOTE — Telephone Encounter (Signed)
Spoke with the patient. Patients Imdur was increased on 10/24/19 to 60mg  daily. Patient sts that he develops a headache shortly after taking the medication. He has been taking BC powder as needed for the headache. Patient sts that the headache when then resolve.  Adv the patient that Imdur can cause a headache while his body adjust to the medication and that it should subside over the next couple of days. Patient denies light headiness or dizziness. Advised the patient to avoid [facility] or any NSAIDs. Adv him that he can take Tylenol prn for headache or pain. The patient stst that other than the mild headache he is tolerating the medication. He is unable to provide VS.  Adv the patient to continue Imdur as prescribed and to contact the office if his symptoms do not resolve in a few days. Patient agreeable with plan and voiced appreciation for the call.

## 2019-10-27 MED ORDER — ISOSORBIDE MONONITRATE ER 60 MG PO TB24: 30.0000 mg | ORAL_TABLET | Freq: Every day | ORAL | Status: DC

## 2019-10-27 NOTE — Telephone Encounter (Signed)
Patient calling to discuss med below and continued headache.  He states he cannot keep dealing with this.    Please call.

## 2019-10-27 NOTE — Telephone Encounter (Signed)
Thank you!!  Saumya Hukill S Lawrance Wiedemann, NP  

## 2019-10-27 NOTE — Telephone Encounter (Signed)
Spoke with the patient. Patient sts that he is not able to tolerate the headache that may be associated with the increase Imdur dosage. Patient sts that the headache woke him from his sleep last night.  Talked with Gaynelle Adu, NP. Per Luther Parody if the patient is not able to tolerate Imdur 60 mg daily he can reduce it to 30 mg daily. The patient was provided a BP machine to monitor his BP at home.  Patient sts that he has not used the BP machine because he does not know how. Reviewed with patient quick set up of his BP machine. Adv him to get a note pad and to make note of the date, time, BP (top and bottom numbers, and his HR).  Adv the patient to reduce his Imdur to 30 mg daily. To monitor his BP and HR daily and to call the office early next week to provide his readings and an update. Adv the patient that he can still take Tylenol prn for headache.  Patient agrees with the plan and verbalized understanding.

## 2019-10-30 MED ORDER — LISINOPRIL 20 MG PO TABS
20.0000 mg | ORAL_TABLET | Freq: Every day | ORAL | 1 refills | Status: DC
Start: 2019-10-30 — End: 2019-12-20

## 2019-10-30 NOTE — Telephone Encounter (Signed)
°  Pt c/o BP issue: STAT if pt c/o blurred vision, one-sided weakness or slurred speech  1. What are your last 5 BP readings? Over the weekend it was 138/? - this morning it was 144/85 HR 92   2. Are you having any other symptoms (ex. Dizziness, headache, blurred vision, passed out)? No, just headaches   3. What is your BP issue? BP is getting high - would like to know if he can make changes to BP medication.  Please call to discuss.

## 2019-10-30 NOTE — Telephone Encounter (Signed)
Patient calling in stating his head is still hurting and has all weekend. Patient states he wants to stop taking this medication

## 2019-10-30 NOTE — Telephone Encounter (Signed)
Call transferred directly to triage.  The patient called today with ongoing complaints of a  severe headache.  He was started on imdur 60 mg once daily on 10/24/19. He called late last week with reports of a headache and was instructed to decrease imdur to 30 mg once daily. He called today with continued complaints of severe headache on the lower dose imdur.  Reviewed with Gillian Shields, NP. Recommendations received to: 1) Stop imdur 2) Start lisinopril 20 mg once daily   Per the patient, he is unsure if he can pick up the lisinopril until Friday due to cost.  Luther Parody was made aware of this and advised if the patient still has amlodipine at home, that he could take this once daily until he picks up his lisinopril RX.   The patient confirmed he has amlodipine 5 mg tablets at home.  He had further questions about his medications and what he should be taking and not be taking. We tried to go over all the bottles he has at home, but it was unclear as to what he had that he might be taking and not taking.  The patient was unsure if what some of his medications were for. I spent 40 minutes on the phone with the patient and I did not feel certain that he understood his medications. I asked the patient if he could please come to the office to see me this afternoon or tomorrow to discuss and review all the medications he has at home. The patient is agreeable. He is unsure if he will be able to come today or tomorrow.  I advised I am here and he can ask for me either day. I have asked that he bring all the bottles of medications he has in his possession, if taking or not, so we can clarify all of these.   I did clarify with the patient that he will be stopping imdur and starting lisinopril 20 mg once daily. The patient thought later in our conversation he might be able to pick this up today.  Await face to face encounter with the patient to review his medications.

## 2019-10-30 NOTE — Addendum Note (Signed)
Addended by: Sherri Rad C on: 10/30/2019 12:54 PM   Modules accepted: Orders

## 2019-11-01 NOTE — Telephone Encounter (Signed)
Patient calling  States he is still having the same problem with medications Today he has another headache - no other symptoms Please call to discuss

## 2019-11-01 NOTE — Telephone Encounter (Signed)
Spoke with the patient. He was instructed 2 day ago to stop Imdur and started Lisinopril 20 mg daily. Patient sts that he did pick up the Lisinopril but he is not sure if he is still taking the Imdur. I asked the patient to gather his bottles. Patient sts that he is not home currently. He will call our office back once he gets home and can gather his bottles.

## 2019-11-01 NOTE — Telephone Encounter (Signed)
Spoke with the patient. Patient is still complaining of a headache. It doesn't seem clear that he has stopped his Imdur as previously instructed. Patient had all his medication bottles gathered just like I asked him to.  Patient is very confused about what medication he should be taking and which should be stopped. Attempted to review with the patient over the phone, he was not able to identify any of the medications even with me spelling them out.  Adv the patient that it would be more beneficial for [place] to review his medications in person. He was not able to come to the office yesterday as he discussed with Herbert Seta, RN, he could not secure a ride.  He does think that he will be able to get a ride to our office on Fri 11/03/19. Adv the patient that he can walk in between 8am-4pm and ask for a nurse. Adv him to bring ALL of his medications with him and his BP machine.  Patient verbalized understanding.

## 2019-11-02 NOTE — Telephone Encounter (Addendum)
Spoke with the patient. Patients sts that he will not be able to make to our office tomorrow, he is not able to secure a ride.  Patient sts that he sis have a slight headache yesterday but not today.  I asked the patient to gather his medication bottles. Reviewed the patients medications with him over the phone. The patient sts that he stopped Imdur as instructed. The the last time he took the medication was Tues 10/31/19.  Patient sts that he is taking the following medications  - Aspirin 81 mg daily -Plavix 75 mg daily -Carvedilol 3.125 mg twice daily -Atorvastatin 80 mg daily -Lisinopril 20 mg daily  Advised the patient that the medications he is currently taking should not cause him to have headache. Advised the patient that if he still has some questions regarding his medications he should still come to our office next Tuesday and to bring all of his medications and BP machine with him so that a nurse can help him review his meds and give him a tutorial for his BP machine.  Patient verbalized understanding.

## 2019-11-02 NOTE — Telephone Encounter (Signed)
Patient calling  States he will not be able to come in tomorrow to review medicine - says he may try Tuesday Still having the same issues  Please call to discuss

## 2019-11-07 NOTE — Telephone Encounter (Signed)
Patient calling  Patient states he will try to come in office today States that he is still having headaches Please call to discuss

## 2019-11-07 NOTE — Telephone Encounter (Signed)
Spoke with the patient. Advised the patient that I have talked with Gillian Shields, NP. The patient may need to f/u with his pcp to discuss his ongoing headache, and his pcp office may be easier for him to get to. Patient is agreeable with the plan. Patient sts that it is easier for him to get to his pcp'd office. He is scheduled to see someone there today at 2pm.  Patient will f/u with cardiology as planned. Sooner if cardiac symptoms develop.

## 2019-11-10 NOTE — Telephone Encounter (Signed)
Called the patient. [email or web address]. 

## 2019-11-10 NOTE — Telephone Encounter (Signed)
Patient calling to speak with nurse about headache.  Requesting a call to discuss.

## 2019-11-12 ENCOUNTER — Encounter: Payer: Self-pay | Admitting: Emergency Medicine

## 2019-11-12 ENCOUNTER — Emergency Department
Admission: EM | Admit: 2019-11-12 | Discharge: 2019-11-13 | Disposition: A | Discharge status: Home / Self Care | Payer: Medicare Other | Attending: Emergency Medicine | Admitting: Emergency Medicine

## 2019-11-12 ENCOUNTER — Other Ambulatory Visit: Payer: Self-pay

## 2019-11-12 ENCOUNTER — Emergency Department: Payer: Medicare Other

## 2019-11-12 DIAGNOSIS — Z955 Presence of coronary angioplasty implant and graft: Secondary | ICD-10-CM | POA: Diagnosis not present

## 2019-11-12 DIAGNOSIS — R030 Elevated blood-pressure reading, without diagnosis of hypertension: Secondary | ICD-10-CM

## 2019-11-12 DIAGNOSIS — Z79899 Other long term (current) drug therapy: Secondary | ICD-10-CM | POA: Diagnosis not present

## 2019-11-12 DIAGNOSIS — Z87891 Personal history of nicotine dependence: Secondary | ICD-10-CM | POA: Insufficient documentation

## 2019-11-12 DIAGNOSIS — R519 Headache, unspecified: Secondary | ICD-10-CM | POA: Insufficient documentation

## 2019-11-12 DIAGNOSIS — I2510 Atherosclerotic heart disease of native coronary artery without angina pectoris: Secondary | ICD-10-CM | POA: Diagnosis not present

## 2019-11-12 DIAGNOSIS — I10 Essential (primary) hypertension: Secondary | ICD-10-CM | POA: Insufficient documentation

## 2019-11-12 DIAGNOSIS — Z7982 Long term (current) use of aspirin: Secondary | ICD-10-CM | POA: Diagnosis not present

## 2019-11-12 DIAGNOSIS — Z7902 Long term (current) use of antithrombotics/antiplatelets: Secondary | ICD-10-CM | POA: Insufficient documentation

## 2019-11-12 LAB — BASIC METABOLIC PANEL
Anion gap: 6 (ref 5–15)
BUN: 11 mg/dL (ref 6–20)
CO2: 23 mmol/L (ref 22–32)
Calcium: 8.8 mg/dL — ABNORMAL LOW (ref 8.9–10.3)
Chloride: 109 mmol/L (ref 98–111)
Creatinine, Ser: 1.22 mg/dL (ref 0.61–1.24)
GFR calc Af Amer: >60 mL/min (ref >60)
GFR calc non Af Amer: >60 mL/min (ref >60)
Glucose, Bld: 119 mg/dL — ABNORMAL HIGH (ref 70–99)
Potassium: 3.4 mmol/L — ABNORMAL LOW (ref 3.5–5.1)
Sodium: 138 mmol/L (ref 135–145)

## 2019-11-12 LAB — CBC WITH DIFFERENTIAL/PLATELET
Abs Immature Granulocytes: 0.01 10*3/uL (ref 0.00–0.07)
Basophils Absolute: 0 10*3/uL (ref 0.0–0.1)
Basophils Relative: 0 %
Eosinophils Absolute: 0.2 10*3/uL (ref 0.0–0.5)
Eosinophils Relative: 4 %
HCT: 37.3 % — ABNORMAL LOW (ref 39.0–52.0)
Hemoglobin: 13.8 g/dL (ref 13.0–17.0)
Immature Granulocytes: 0 %
Lymphocytes Relative: 56 %
Lymphs Abs: 3.3 10*3/uL (ref 0.7–4.0)
MCH: 28.8 pg (ref 26.0–34.0)
MCHC: 37 g/dL — ABNORMAL HIGH (ref 30.0–36.0)
MCV: 77.9 fL — ABNORMAL LOW (ref 80.0–100.0)
Monocytes Absolute: 0.6 10*3/uL (ref 0.1–1.0)
Monocytes Relative: 10 %
Neutro Abs: 1.7 10*3/uL (ref 1.7–7.7)
Neutrophils Relative %: 30 %
Platelets: 240 10*3/uL (ref 150–400)
RBC: 4.79 MIL/uL (ref 4.22–5.81)
RDW: 14.2 % (ref 11.5–15.5)
WBC: 5.8 10*3/uL (ref 4.0–10.5)
nRBC: 0 % (ref 0.0–0.2)

## 2019-11-12 MED ORDER — ACETAMINOPHEN 325 MG PO TABS: 650.0000 mg | ORAL_TABLET | Freq: Once | ORAL | Status: DC

## 2019-11-12 MED ORDER — BUTALBITAL-APAP-CAFFEINE 50-325-40 MG PO TABS: 1.0000 | ORAL_TABLET | Freq: Once | ORAL | Status: DC

## 2019-11-12 NOTE — ED Notes (Signed)
Patient to stat desk in no acute distress asking about wait time. Patient given update on wait time. Patient verbalizes understanding.  

## 2019-11-12 NOTE — ED Provider Notes (Signed)
[facility] Emergency Department Provider Note  ____________________________________________  Time seen: Approximately 11:30 PM  I have reviewed the triage vital signs and the nursing notes.   HISTORY  Chief Complaint Hypertension, Cough, and Headache   HPI Jeff Rios is a 54 y.o. male history of CAD status post stenting on Plavix, hypertension hyperlipidemia who presents for evaluation of headache.  Patient reports intermittent headaches for the last 2 weeks since he saw his cardiologist and was started on a new medication.  Patient is not sure what the medication is but thinks it begins with an I.  Review of epic seems the patient was started on Imdur.  He reports that the headaches are usually located on the left side of his head front and back, throbbing.  He has been taking Tylenol at home.  At this time he has no headache.  No changes in vision, no slurred speech, no facial droop, no unilateral weakness or numbness, no neck stiffness, no fever.  Patient denies chest pain, shortness of breath, fever, cough (even though triage note says cough patient denies having any cough).  Today he checked his blood pressure which was elevated with systolics in the 190s and that made him concerned.  He reports taking an extra Coreg before coming in to be evaluated.   Past Medical History:  Diagnosis Date  . CAD (coronary artery disease)   . HLD (hyperlipidemia)   . Hypertension     Patient Active Problem List   Diagnosis Date Noted  . HTN (hypertension) 03/19/2019  . HLD (hyperlipidemia) 03/19/2019  . Chest pain 03/18/2019  . ACS (acute coronary syndrome) (HCC) 03/18/2019  . Non-ST elevation (NSTEMI) myocardial infarction [facility](HCC)     Past Surgical History:  Procedure Laterality Date  . CARDIAC CATHETERIZATION    . CORONARY STENT INTERVENTION N/A 03/18/2019   Procedure: CORONARY STENT INTERVENTION;  Surgeon: Iran OuchArida, Muhammad A, MD;  Location: [facility] INVASIVE CV  LAB;  Service: Cardiovascular;  Laterality: N/A;  . CORONARY STENT INTERVENTION N/A 03/20/2019   Procedure: CORONARY STENT INTERVENTION;  Surgeon: Iran OuchArida, Muhammad A, MD;  Location: [facility] INVASIVE CV LAB;  Service: Cardiovascular;  Laterality: N/A;  . CORONARY/GRAFT ACUTE MI REVASCULARIZATION N/A 03/18/2019   Procedure: Coronary/Graft Acute MI Revascularization;  Surgeon: Iran OuchArida, Muhammad A, MD;  Location: [facility] INVASIVE CV LAB;  Service: Cardiovascular;  Laterality: N/A;  . LEFT HEART CATH AND CORONARY ANGIOGRAPHY N/A 03/18/2019   Procedure: LEFT HEART CATH AND CORONARY ANGIOGRAPHY;  Surgeon: Iran OuchArida, Muhammad A, MD;  Location: [facility] INVASIVE CV LAB;  Service: Cardiovascular;  Laterality: N/A;    Prior to Admission medications   Medication Sig Start Date End Date Taking? Authorizing Provider  aspirin 81 MG EC tablet Take 1 tablet (81 mg total) by mouth daily. 03/22/19 03/16/20  Marzetta BoardSpongberg, Christopher N, MD  atorvastatin (LIPITOR) 80 MG tablet Take 1 tablet (80 mg total) by mouth daily. 09/19/19 03/17/20  Alver SorrowWalker, Caitlin S, NP  butalbital-acetaminophen-caffeine (FIORICET) [phone] MG tablet Take 1-2 tablets by mouth every 6 (six) hours as needed for headache. 11/13/19 11/12/20  Don PerkingVeronese, [place], MD  carvedilol (COREG) 3.125 MG tablet Take 1 tablet (3.125 mg total) by mouth 2 (two) times daily with a meal. 03/21/19 03/15/20  Spongberg, Susy Frizzlehristopher N, MD  clopidogrel (PLAVIX) 75 MG tablet Take 1 tablet (75 mg total) by mouth daily. 10/03/19   Alver SorrowWalker, Caitlin S, NP  lisinopril (ZESTRIL) 20 MG tablet Take 1 tablet (20 mg total) by mouth daily. 10/30/19 01/28/20  Alver SorrowWalker, Caitlin S,  NP  nitroGLYCERIN (NITROSTAT) 0.4 MG SL tablet Place 1 tablet (0.4 mg total) under the tongue every 5 (five) minutes as needed for chest pain. 03/21/19 09/18/28  Spongberg, Susy Frizzle, MD    Allergies Amlodipine and Imdur [isosorbide nitrate]  Family History  Family history unknown: Yes    Social History Social History    Tobacco Use  . Smoking status: Former Smoker    Packs/day: 1.00    Years: 30.00    Pack years: 30.00    Types: Cigarettes  . Smokeless tobacco: Never Used  . Tobacco comment: ready to quit. Havent smoked in about 4 weeks.  Vaping Use  . Vaping Use: Never used  Substance Use Topics  . Alcohol use: No  . Drug use: No    Review of Systems  Constitutional: Negative for fever. Eyes: Negative for visual changes. ENT: Negative for sore throat. Neck: No neck pain  Cardiovascular: Negative for chest pain. Respiratory: Negative for shortness of breath. Gastrointestinal: Negative for abdominal pain, vomiting or diarrhea. Genitourinary: Negative for dysuria. Musculoskeletal: Negative for back pain. Skin: Negative for rash. Neurological: Negative for weakness or numbness. + HA Psych: No SI or HI  ____________________________________________   PHYSICAL EXAM:  VITAL SIGNS: ED Triage Vitals  Enc Vitals Group     BP 11/12/19 1733 (!) 142/82     Pulse Rate 11/12/19 1733 77     Resp 11/12/19 1733 16     Temp 11/12/19 1733 97.9 F (36.6 C)     Temp Source 11/12/19 1733 Oral     SpO2 11/12/19 1733 95 %     Weight 11/12/19 1733 190 lb (86.2 kg)     Height 11/12/19 1733 5\' 3"  (1.6 m)     Head Circumference --      Peak Flow --      Pain Score 11/12/19 1741 3     Pain Loc --      Pain Edu? --      Excl. in GC? --     Constitutional: Alert and oriented. Well appearing and in no apparent distress. HEENT:      Head: Normocephalic and atraumatic.         Eyes: Conjunctivae are normal. Sclera is non-icteric.       Mouth/Throat: Mucous membranes are moist.       Neck: Supple with no signs of meningismus. Cardiovascular: Regular rate and rhythm. No murmurs, gallops, or rubs. 2+ symmetrical distal pulses are present in all extremities. No JVD. Respiratory: Normal respiratory effort. Lungs are clear to auscultation bilaterally. No wheezes, crackles, or rhonchi.  Gastrointestinal:  Soft, non tender. Musculoskeletal:  No edema, cyanosis, or erythema of extremities. Neurologic: Normal speech and language. Face is symmetric. Moving all extremities. EOMI, PERRL, intact strength and sensation x4, no pronator drift, no dysmetria, normal gait Skin: Skin is warm, dry and intact. No rash noted. Psychiatric: Mood and affect are normal. Speech and behavior are normal.  ____________________________________________   LABS (all labs ordered are listed, but only abnormal results are displayed)  Labs Reviewed  CBC WITH DIFFERENTIAL/PLATELET - Abnormal; Notable for the following components:      Result Value   HCT 37.3 (*)    MCV 77.9 (*)    MCHC 37.0 (*)    All other components within normal limits  BASIC METABOLIC PANEL - Abnormal; Notable for the following components:   Potassium 3.4 (*)    Glucose, Bld 119 (*)    Calcium 8.8 (*)  All other components within normal limits   ____________________________________________  EKG  ED ECG REPORT I, Nita Sickle, the attending physician, personally viewed and interpreted this ECG.  Normal sinus rhythm, rate of 62, normal intervals, T wave inversions in lateral leads with no ST elevation.  Unchanged from prior. ____________________________________________  RADIOLOGY  I have personally reviewed the images performed during this visit and I agree with the Radiologist's read.   Interpretation by Radiologist:  CT Head Wo Contrast  Result Date: 11/13/2019 CLINICAL DATA:  54 year old male with headache. EXAM: CT HEAD WITHOUT CONTRAST TECHNIQUE: Contiguous axial images were obtained from the base of the skull through the vertex without intravenous contrast. COMPARISON:  Head CT dated 05/19/2009. FINDINGS: Brain: The ventricles and sulci appropriate size for patient's age. The gray-white matter discrimination is preserved. There is no acute intracranial hemorrhage. No mass effect or midline shift. No extra-axial fluid  collection. Vascular: No hyperdense vessel or unexpected calcification. Skull: Normal. Negative for fracture or focal lesion. Sinuses/Orbits: No acute finding. Other: None IMPRESSION: Unremarkable noncontrast CT of the brain. Electronically Signed   By: Elgie Collard M.D.   On: 11/13/2019 00:08      ____________________________________________   PROCEDURES  Procedure(s) performed:yes  .1-3 Lead EKG Interpretation Performed by: Nita Sickle, MD Authorized by: Nita Sickle, MD     Interpretation: normal     ECG rate assessment: normal     Rhythm: sinus rhythm     Ectopy: none     Critical Care performed:  None ____________________________________________   INITIAL IMPRESSION / ASSESSMENT AND PLAN / ED COURSE  54 y.o. male history of CAD status post stenting on Plavix, hypertension hyperlipidemia who presents for evaluation of intermittent left-sided headaches x 2 weeks since being started on a new medication.  Patient is not sure which medication is it but thinks that he believes with the I.  Looking at prior medical records, it seems like patient was seen by his cardiologist 2 weeks ago and started on Imdur.  This is a possible side effect of this medication.  However since patient has coronary artery disease with a recent NSTEMI and stents placed on November 2020 we will hold off making any changes to the medication and recommended that he called his cardiologist first thing in the morning for any guidance.  At this time he has no headache.  Is otherwise completely neurologically intact.  His blood pressure here is normal, 142/82.  His labs show no acute findings.  Head CT pending since patient is on a blood thinner. EKG non ischemic.  Ddx medication side effect, migraines, tension HA, hypertensive HAs.  No thunderclap headache, no signs of meningitis, no temporal artery tenderness, no neurological deficits, no changes in vision with a normal eye exam.  Will prescribe  Fioricet for headaches as needed.  Old medical records reviewed.  Patient placed on telemetry for close monitoring.  _________________________ 12:44 AM on 11/13/2019 -----------------------------------------  BP remains stable.  Patient remains without any headaches and neurologically intact.  CT head with no acute findings, confirmed by radiology.  Will discharge home with a prescription for Fioricet.  Recommended calling his cardiologist office first thing in the morning to have the Imdur adjusted or changed.  Discussed my standard return precautions    _____________________________________________ Please note:  Patient was evaluated in Emergency Department today for the symptoms described in the history of present illness. Patient was evaluated in the context of the global COVID-19 pandemic, which necessitated consideration that the patient might  be at risk for infection with the SARS-CoV-2 virus that causes COVID-19. Institutional protocols and algorithms that pertain to the evaluation of patients at risk for COVID-19 are in a state of rapid change based on information released by regulatory bodies including the CDC and federal and state organizations. These policies and algorithms were followed during the patient's care in the ED.  Some ED evaluations and interventions may be delayed as a result of limited staffing during the pandemic.   [place] Controlled Substance Database was reviewed by me. ____________________________________________   FINAL CLINICAL IMPRESSION(S) / ED DIAGNOSES   Final diagnoses:  Acute nonintractable headache, unspecified headache type  Elevated blood pressure reading      NEW MEDICATIONS STARTED DURING THIS VISIT:  ED Discharge Orders         Ordered    butalbital-acetaminophen-caffeine (FIORICET) 50-325-40 MG tablet  Every 6 hours PRN     Discontinue  Reprint     11/13/19 0043           Note:  This document was prepared using Dragon voice recognition  software and may include unintentional dictation errors.    Don Perking, [place], MD 11/13/19 [phone]

## 2019-11-12 NOTE — ED Triage Notes (Signed)
Pt to ED via POV c/o high blood pressure, cough, and headache. Pt states that his blood pressure was 190 systolic. Pt states that he is on blood pressure medication once daily but he took an extra dose when he saw his BP was elevated. Pt is also having pain on the left side of his head. Pt states his headache has eased off at this time. Pt is in NAD.

## 2019-11-13 MED ORDER — BUTALBITAL-APAP-CAFFEINE 50-325-40 MG PO TABS: 1.0000 | ORAL_TABLET | Freq: Four times a day (QID) | ORAL | 0 refills | Status: AC | PRN

## 2019-11-13 MED ORDER — BUTALBITAL-APAP-CAFFEINE 50-325-40 MG PO TABS
1.0000 | ORAL_TABLET | Freq: Once | ORAL | Status: AC
  Administered 2019-11-13: 1 via ORAL
  Filled 2019-11-13: qty 1

## 2019-11-13 NOTE — Discharge Instructions (Signed)
You have been seen in the Emergency Department (ED) for a headache. Your evaluation today was overall reassuring. Headaches have many possible causes. Most headaches aren't a sign of a more serious problem, and they will get better on their own.   Follow-up with your doctor in 12-24 hours if you are still having a headache. Otherwise follow up with your doctor in 3-5 days.  For pain take fioricet as prescribed. Call your cardiologist first thing in the morning for adjustment of your medications.  When should you call for help?  Call 911 or return to the ED anytime you think you may need emergency care. For example, call if:  You have signs of a stroke. These may include:  Sudden numbness, paralysis, or weakness in your face, arm, or leg, especially on only one side of your body.  Sudden vision changes.  Sudden trouble speaking.  Sudden confusion or trouble understanding simple statements.  Sudden problems with walking or balance.  A sudden, severe headache that is different from past headaches. You have new or worsening headache Nausea and vomiting associated with your headache Fever, neck stiffness associated with your headache  Call your doctor now or seek immediate medical care if:  You have a new or worse headache.  Your headache gets much worse.  How can you care for yourself at home?  Do not drive if you have taken a prescription pain medicine.  Rest in a quiet, dark room until your headache is gone. Close your eyes and try to relax or go to sleep. Don't watch TV or read.  Put a cold, moist cloth or cold pack on the painful area for 10 to 20 minutes at a time. Put a thin cloth between the cold pack and your skin.  Use a warm, moist towel or a heating pad set on low to relax tight shoulder and neck muscles.  Have someone gently massage your neck and shoulders.  Take pain medicines exactly as directed.  If the doctor gave you a prescription medicine for pain, take it as prescribed.    If you are not taking a prescription pain medicine, ask your doctor if you can take an over-the-counter medicine. Be careful not to take pain medicine more often than the instructions allow, because you may get worse or more frequent headaches when the medicine wears off.  Do not ignore new symptoms that occur with a headache, such as a fever, weakness or numbness, vision changes, or confusion. These may be signs of a more serious problem.  To prevent headaches  Keep a headache diary so you can figure out what triggers your headaches. Avoiding triggers may help you prevent headaches. Record when each headache began, how long it lasted, and what the pain was like (throbbing, aching, stabbing, or dull). Write down any other symptoms you had with the headache, such as nausea, flashing lights or dark spots, or sensitivity to bright light or loud noise. Note if the headache occurred near your period. List anything that might have triggered the headache, such as certain foods (chocolate, cheese, wine) or odors, smoke, bright light, stress, or lack of sleep.  Find healthy ways to deal with stress. Headaches are most common during or right after stressful times. Take time to relax before and after you do something that has caused a headache in the past.  Try to keep your muscles relaxed by keeping good posture. Check your jaw, face, neck, and shoulder muscles for tension, and try relaxing them. When  sitting at a desk, change positions often, and stretch for 30 seconds each hour.  Get plenty of sleep and exercise.  Eat regularly and well. Long periods without food can trigger a headache.  Treat yourself to a massage. Some people find that regular massages are very helpful in relieving tension.  Limit caffeine by not drinking too much coffee, tea, or soda. But don't quit caffeine suddenly, because that can also give you headaches.  Reduce eyestrain from computers by blinking frequently and looking away from the  computer screen every so often. Make sure you have proper eyewear and that your monitor is set up properly, about an arm's length away.  Seek help if you have depression or anxiety. Your headaches may be linked to these conditions. Treatment can both prevent headaches and help with symptoms of anxiety or depression.

## 2019-11-13 NOTE — ED Notes (Signed)
Pt c/o HA, asking for something for pain  Before leaving. New order from Dr. Don Perking for x1 dose of fioricet.

## 2019-11-13 NOTE — Telephone Encounter (Signed)
Iran Ouch, MD  Alver Sorrow, NP; Vania Rea Monia Sabal, RN This patient went to the ED recently with headache. I could not tell from the notes of his taking Imdur or not. If he is taking Imdur, please stop it and arrange for him to follow-up in the office as he seems to be confused about his medications as usual.

## 2019-11-16 ENCOUNTER — Telehealth: Payer: Self-pay | Admitting: Family

## 2019-11-16 NOTE — Telephone Encounter (Signed)
Patient calling  Still having headaches Patient scheduled to see C. Walker tomorrow 7/16 at 2p

## 2019-11-16 NOTE — Telephone Encounter (Signed)
Call to patient to make him aware that I believe it is appropriate to bring cuff to appt tomorrow to compare our readings to his for accuracy.   Advised pt to call for any further questions or concerns.

## 2019-11-16 NOTE — Telephone Encounter (Signed)
Patient calling in with inconsistent BP reading from home BP cuff. Patient is calling in to see if he is in normal range and gave me a BP of 139/84. Patient was instructed that everyone is different and I am unable to tell him yes or no but will have a nurse call. Patient is also scheduled for a visit tomorrow and I recommended patient bring in his BP machine so he may be educated on the use   Please advise

## 2019-11-16 NOTE — Telephone Encounter (Signed)
Returned call to patient. He reports that headache has persisted intermittently since ED visit last Sunday. fioricet they gave him has helped most days. He does not have current vs but was stable in the ED>   Does not have any worsening or sx or changes in vision.   I told him to keep appt for tomorrow and seek urgent medical care if sx change or worsen in the meantime.

## 2019-11-17 ENCOUNTER — Telehealth: Payer: Self-pay | Admitting: Cardiovascular Disease

## 2019-11-17 ENCOUNTER — Ambulatory Visit: Payer: Medicare Other | Admitting: Family

## 2019-11-17 NOTE — Telephone Encounter (Signed)
*  STAT* If patient is at the pharmacy, call can be transferred to refill team.   1. Which medications need to be refilled? (please list name of each medication and dose if known)   Fioricet 50-325-40 mg  1-2 tabs po q 6 h prn   2. Which pharmacy/location (including street and city if local pharmacy) is medication to be sent to?  [facility] [place]   3. Do they need a 30 day or 90 day supply? 90  Patient continues to have headaches

## 2019-11-17 NOTE — Telephone Encounter (Signed)
Pt made aware to contact his PCP for his Refill.

## 2019-11-17 NOTE — Telephone Encounter (Signed)
Refill rqst should be sent to the patients pcp.

## 2019-11-17 NOTE — Progress Notes (Deleted)
Office Visit    Patient Name: Jeff Rios Date of Encounter: 11/17/2019  Primary Care Provider:  [facility] Primary Cardiologist:  Lorine BearsMuhammad Arida, MD Electrophysiologist:  None   Chief Complaint    Jeff Rios is a 54 y.o. male with a hx of CAD with NSTEMI 03/18/2019 s/p PCI/DES to RCA and LAD, HTN, HLD, tobacco abuse presents today for follow up of HTN with chief complaint of headache.  Past Medical History    Past Medical History:  Diagnosis Date  . CAD (coronary artery disease)   . HLD (hyperlipidemia)   . Hypertension    Past Surgical History:  Procedure Laterality Date  . CARDIAC CATHETERIZATION    . CORONARY STENT INTERVENTION N/A 03/18/2019   Procedure: CORONARY STENT INTERVENTION;  Surgeon: Iran OuchArida, Muhammad A, MD;  Location: [facility] INVASIVE CV LAB;  Service: Cardiovascular;  Laterality: N/A;  . CORONARY STENT INTERVENTION N/A 03/20/2019   Procedure: CORONARY STENT INTERVENTION;  Surgeon: Iran OuchArida, Muhammad A, MD;  Location: [facility] INVASIVE CV LAB;  Service: Cardiovascular;  Laterality: N/A;  . CORONARY/GRAFT ACUTE MI REVASCULARIZATION N/A 03/18/2019   Procedure: Coronary/Graft Acute MI Revascularization;  Surgeon: Iran OuchArida, Muhammad A, MD;  Location: [facility] INVASIVE CV LAB;  Service: Cardiovascular;  Laterality: N/A;  . LEFT HEART CATH AND CORONARY ANGIOGRAPHY N/A 03/18/2019   Procedure: LEFT HEART CATH AND CORONARY ANGIOGRAPHY;  Surgeon: Iran OuchArida, Muhammad A, MD;  Location: [facility] INVASIVE CV LAB;  Service: Cardiovascular;  Laterality: N/A;    Allergies  Allergies  Allergen Reactions  . Amlodipine Swelling    Patient states he had an allergic reaction and was possibly from amlodipine. Taken off that and switched to metoprolol.   . Imdur [Isosorbide Nitrate] Other (See Comments)    Severe headache    History of Present Illness    Jeff Rios is a 54 y.o. male with a hx of CAD with NSTEMI 03/18/2019 s/p PCI/DES to RCA and LAD, HTN, HLD,  tobacco abuse.  He was last seen 10/24/19.   He was seen in the ED 03/05/2019 with idiopathic angioedema and treated steroids.  Some concern that was related to amlodipine and it was discontinued and transitioned to metoprolol.  Present in the hospital 03/18/2019 severe substernal chest discomfort woke him from sleep.  EKG with NSR with less than 1 mm ST elevation in lead III withST elevation in other leads with anterolateral T wave inversion in my ST depression in leads I and aVL.  Initial high-sensitivity troponin 59, delta 210, peak 24,643.  Emergent left heart cath showing significant two-vessel CAD with culprit being occluded distal RCA with faint left-to-right collaterals.  Also significant mid LAD stenosis which appeared to be hazy.  LV systolic function was beadlike appearance of the LV and mid cavity gradient suggested a variant of hypertrophic cardiomyopathy was significantly elevated LVEDP in the low 30s.  Underwent successful PCI/DES to distal RCA.  Echo with LVEF 60 to 65%, mild LVH, grade 1 diastolic dysfunction, normal RV systolic function and cavity size, no significant valvular abnormality.  Underwent staged PCI to the mid LAD 03/20/2019.  On review his echo images showed no significant LVH with only mild gradient leading to no further work-up.  ED visit 03/26/2019 with abdominal bruising at site of Lovenox/heparin injections.  Work-up unremarkable with stable CBC.  Seen for follow-up in office 1/20 without any anginal symptoms and amlodipine 5 mg daily was added in setting of mildly elevated BP 140/82.  At office visit in February his amlodipine was uptitrated  to 7.5 mg daily.  At clinic visits 09/05/2019, 09/19/2019, 10/24/19 multiple efforts have been made to consolidate his medication regimen as he was having multiple issues with cost.  Social work assistance appreciated as they were able to get him a $50 [employer] gift card and send BP cuff for home use.  His amlodipine, Zetia were discontinued  due to cost and as he was not presently taking.  His antihypertensive regimen was consolidated to Imdur as with good Rx coupon it was $10 for 90-day supply.  He was not taking his atorvastatin and this was resumed.  His prasugrel was changed to clopidogrel. However, reported headache and Imdur was discontinued. Multiple telephone calls to office, visit to primary care, and ED visit for headache - very unclear whether he discontinued Imdur.   ***  EKGs/Labs/Other Studies Reviewed:   The following studies were reviewed today:  LHC 03/18/2019:  The left ventricular systolic function is normal.  LV end diastolic pressure is normal.  The left ventricular ejection fraction is 55-65% by visual estimate.  Mid LAD lesion is 80% stenosed.  Prox RCA lesion is 30% stenosed.  Dist RCA lesion is 100% stenosed.  Post intervention, there is a 0% residual stenosis.  A drug-eluting stent was successfully placed using a STENT RESOLUTE ONYX 2.25X22.  RPDA lesion is 40% stenosed.   1.  Significant two-vessel coronary artery disease.  The culprit is an occluded distal right coronary artery with faint left-to-right collaterals.  The supplied area is not large and that is the likely reason for nondiagnostic EKG changes for inferior STEMI.  There is also significant mid LAD stenosis which appears to be hazy. 2.  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  Significantly elevated LVEDP in the low 30s. 3.  Successful angioplasty and drug-eluting stent placement to the distal RCA.   Recommendations: Continue dual antiplatelet therapy for at least 1 year.  Aggressive treatment of risk factors. Recommend staged PCI of the LAD before hospital discharge.  This is planned for Monday morning. __________   2D Echo 03/18/2019:  1. Left ventricular ejection fraction, by visual estimation, is 60 to  65%. The left ventricle has normal function. There is mildly increased left ventricular hypertrophy.  2. Left ventricular diastolic parameters are consistent with Grade I diastolic dysfunction (impaired relaxation).  3. Global right ventricle has normal systolic function.The right ventricular size is normal. No increase in right ventricular wall thickness.  4. The mitral valve is normal in structure. No evidence of mitral valve regurgitation. No evidence of mitral stenosis.  5. The tricuspid valve is normal in structure. Tricuspid valve regurgitation is not demonstrated.  6. The aortic valve is normal in structure. Aortic valve regurgitation is not visualized. No evidence of aortic valve sclerosis or stenosis.  7. The pulmonic valve was normal in structure. Pulmonic valve regurgitation is trivial.  8. TR signal is inadequate for assessing pulmonary artery systolic pressure.  9. The inferior vena cava is normal in size with greater than 50% respiratory variability, suggesting right atrial pressure of 3 mmHg. __________   PCI 03/20/2019:  RPDA lesion is 40% stenosed.  Prox RCA lesion is 30% stenosed.  Previously placed Dist RCA drug eluting stent is widely patent.  Balloon angioplasty was performed.  Mid LAD lesion is 80% stenosed.  Post intervention, there is a 0% residual stenosis.  A drug-eluting stent was successfully placed using a STENT RESOLUTE ONYX  3.0J81.   Successful angioplasty and drug-eluting stent placement to the mid LAD.   Recommendations: Continue dual antiplatelet therapy for at least 1 year. Aggressive treatment of risk factors. Likely discharge home tomorrow. He is to stay off work for 2 weeks.  EKG: EKG ordered today demonstrates ***  Recent Labs: 09/05/2019: ALT 34 11/12/2019: BUN 11; Creatinine, Ser 1.22; Hemoglobin 13.8; Platelets 240; Potassium 3.4; Sodium 138  Recent Lipid Panel    Component Value Date/Time   CHOL 215 (H) 09/05/2019 1221   CHOL 200 (H)  06/07/2019 0922   TRIG 117 09/05/2019 1221   HDL 61 09/05/2019 1221   HDL 64 06/07/2019 0922   CHOLHDL 3.5 09/05/2019 1221   VLDL 23 09/05/2019 1221   LDLCALC 131 (H) 09/05/2019 1221   LDLCALC 122 (H) 06/07/2019 0922   Home Medications   No outpatient medications have been marked as taking for the 11/17/19 encounter (Appointment) with Alver Sorrow, NP.    Review of Systems     *** Review of Systems  Constitutional: Negative for chills, fever and malaise/fatigue.  Cardiovascular: Negative for chest pain, dyspnea on exertion, irregular heartbeat, leg swelling, near-syncope, orthopnea, palpitations and syncope.  Respiratory: Positive for shortness of breath ("need a deep breath sometimes"). Negative for cough and wheezing.   Gastrointestinal: Negative for melena, nausea and vomiting.  Genitourinary: Negative for hematuria.  Neurological: Negative for dizziness, light-headedness and weakness.   All other systems reviewed and are otherwise negative except as noted above.  Physical Exam    VS:  There were no vitals taken for this visit. , BMI There is no height or weight on file to calculate BMI. GEN: Well nourished, overweight, well developed, in no acute distress. HEENT: normal. Neck: Supple, no JVD, carotid bruits, or masses. Cardiac: RRR, no murmurs, rubs, or gallops. No clubbing, cyanosis. Trace bilateral LE edema.  Radials/PT 2+ and equal bilaterally.  Respiratory:  Respirations regular and unlabored, clear to auscultation bilaterally. GI: Soft, nontender, nondistended, BS + x 4. MS: No deformity or atrophy. Skin: Warm and dry, no rash. Neuro:  Strength and sensation are intact. Psych: Normal affect.  Assessment & Plan   *** 1. CAD -  PCI/DES RCA and staged PCI/DES LAD 03/2019. Recommended for 12 mos DAPT starting 03/2019.  Possible transition to Plavix due to cost but he has not yet picked up the pharmacy. Present GDMT includes Aspirin (3 month supply provided in clinic  today), Plavix, Coreg, Atorvastatin, Imdur, PRN Nitroglycerin.   2. LE edema - Echo 03/2019 with normal LVEF. Likely dependent edema, venous insufficiency due to long shifts standing. Reports this is improved since elevating his legs.  Elevate lower extremities, compression stockings, low salt diet recommended.  His amlodipine has been previously discontinued as it was possibly contributory and also cost prohibitive.  3. HTN -BP borderline in clinic.  Reports he feels his blood pressure is elevated at home but does not check.  He has previously been recommended to increase Imdur from 30 mg to 60 mg but has not yet picked up his new prescription or increased his dose.  Education provided.  Social work team has previously sent him BP cuff for home monitoring, but he has not been using. Due to concerns related to cost and compliance, his antihypertensive regimen has been consolidated to include Coreg and Imdur. Encouraged to pick up Imdur 60mg  as previously prescribed.   4. HLD, LDL goal <70 -06/07/19 normal liver function, LDL 122. Zetia 10mg  was added to Atorvastatin 80mg   daily.  However, he picked up Zetia one time and did not refill due to cost.  09/05/19 normal liver function, LDL 131.  Endorses missing doses of atorvastatin as the evening dosage was difficult and encouraged to take regularly in the morning.  Per pharmacy has not picked up his Atorvastatin since last sent.  Reviewed importance of high potency statin in setting of recent DES. Pharmacy has filled and he tells me he will pick it up. Will need reassessment of lipid/liver function after 6-8 weeks of taking consistently.   5. Tobacco use - Tells me he hasn't smoked in a couple months. Continued cessation encouraged. Recommend utilization of 1800QUITNOW.  6. Medication noncompliance/financial difficulties  - Asked to bring medicaiton bottles to this visit, but he has not for last three visits. Gets medications at [facility] with GoodRx coupon but  reports difficulty in affording. Medications have been consolidated, as above. SW has sent BP cuff to home and $50 [employer] giftcard.   Disposition: Follow up up*** in 3 month(s) with Dr. Kirke Corin or APP.    Alver Sorrow, NP 11/17/2019, 8:05 AM

## 2019-11-17 NOTE — Telephone Encounter (Signed)
Please advise if ok to refill non cardiac medication : Fioricet 50-325-40 mg tablet 1-2 tabs po every 6 hours. Last filled by Emergency department.

## 2019-11-20 NOTE — Telephone Encounter (Signed)
Patient calling in regards to refill Informed patient several times that he will need to contact PCP and attempted to get details to help him get in contact with PCP Patient states he will call [facility] to try and get in touch with them again

## 2019-11-21 ENCOUNTER — Telehealth: Payer: Self-pay

## 2019-11-21 ENCOUNTER — Ambulatory Visit: Payer: Medicare Other | Admitting: Nurse Practitioner

## 2019-11-21 NOTE — Telephone Encounter (Signed)
Patient called back and asked about his headache medication, per last telephone encounter I advised patient to reach out to his PCP for this refill.

## 2019-11-22 ENCOUNTER — Ambulatory Visit: Payer: Medicare Other | Admitting: Family

## 2019-11-29 ENCOUNTER — Ambulatory Visit: Payer: Medicare Other | Admitting: Physician Assistant

## 2019-12-05 ENCOUNTER — Telehealth: Payer: Self-pay | Admitting: Family

## 2019-12-05 DIAGNOSIS — I2510 Atherosclerotic heart disease of native coronary artery without angina pectoris: Secondary | ICD-10-CM

## 2019-12-05 DIAGNOSIS — I10 Essential (primary) hypertension: Secondary | ICD-10-CM

## 2019-12-05 NOTE — Telephone Encounter (Addendum)
Returned the call to Aram Beecham at [facility]. [email or web address].

## 2019-12-05 NOTE — Telephone Encounter (Signed)
Please call to discuss medications. Patient is not sure what heart medications he is supposed to be taking.

## 2019-12-05 NOTE — Telephone Encounter (Signed)
Aram Beecham returning call  States instead of leaving message once number is dialed press 0 to get through

## 2019-12-05 NOTE — Telephone Encounter (Signed)
That is amazing! Thanks for keeping me in the loop!  Alver Sorrow, NP

## 2019-12-05 NOTE — Telephone Encounter (Signed)
Spoke with Aram Beecham  the Pharmacist at [facility]. Reviewed with her the patients current cardiac medications we have listed.  Asa 81 mg daily Plavix 75 mg daily Carvedilol 3.125 mg bid Lisinopril 20 mg daily Nitro- as directed  Aram Beecham sts that they are working on giving the patient bubble packs of his medication which will hopefully lessen his confusion with his medications and support compliance.  Alessandra Grout for her assistance.

## 2019-12-07 ENCOUNTER — Other Ambulatory Visit: Payer: Self-pay

## 2019-12-07 ENCOUNTER — Ambulatory Visit (INDEPENDENT_AMBULATORY_CARE_PROVIDER_SITE_OTHER): Payer: Medicare Other | Admitting: Family

## 2019-12-07 ENCOUNTER — Encounter: Payer: Self-pay | Admitting: Family

## 2019-12-07 VITALS — BP 134/98 | HR 57 | Ht 63.0 in | Wt 186.8 lb

## 2019-12-07 DIAGNOSIS — I2510 Atherosclerotic heart disease of native coronary artery without angina pectoris: Secondary | ICD-10-CM | POA: Diagnosis not present

## 2019-12-07 DIAGNOSIS — R600 Localized edema: Secondary | ICD-10-CM | POA: Diagnosis not present

## 2019-12-07 DIAGNOSIS — Z87891 Personal history of nicotine dependence: Secondary | ICD-10-CM

## 2019-12-07 DIAGNOSIS — Z9114 Patient's other noncompliance with medication regimen: Secondary | ICD-10-CM

## 2019-12-07 DIAGNOSIS — I10 Essential (primary) hypertension: Secondary | ICD-10-CM | POA: Diagnosis not present

## 2019-12-07 DIAGNOSIS — Z79899 Other long term (current) drug therapy: Secondary | ICD-10-CM

## 2019-12-07 NOTE — Progress Notes (Signed)
Office Visit    Patient Name: Jeff Rios Date of Encounter: 12/07/2019  Primary Care Provider:  [facility] Primary Cardiologist:  Lorine Bears, MD Electrophysiologist:  None   Chief Complaint    Jeff Rios is a 54 y.o. male with a hx of CAD with NSTEMI 03/18/2019 s/p PCI/DES to RCA and LAD, HTN, HLD, tobacco abuse presents today for follow up of HTN  Past Medical History    Past Medical History:  Diagnosis Date  . CAD (coronary artery disease)   . HLD (hyperlipidemia)   . Hypertension    Past Surgical History:  Procedure Laterality Date  . CARDIAC CATHETERIZATION    . CORONARY STENT INTERVENTION N/A 03/18/2019   Procedure: CORONARY STENT INTERVENTION;  Surgeon: Iran Ouch, MD;  Location: [facility] INVASIVE CV LAB;  Service: Cardiovascular;  Laterality: N/A;  . CORONARY STENT INTERVENTION N/A 03/20/2019   Procedure: CORONARY STENT INTERVENTION;  Surgeon: Iran Ouch, MD;  Location: [facility] INVASIVE CV LAB;  Service: Cardiovascular;  Laterality: N/A;  . CORONARY/GRAFT ACUTE MI REVASCULARIZATION N/A 03/18/2019   Procedure: Coronary/Graft Acute MI Revascularization;  Surgeon: Iran Ouch, MD;  Location: [facility] INVASIVE CV LAB;  Service: Cardiovascular;  Laterality: N/A;  . LEFT HEART CATH AND CORONARY ANGIOGRAPHY N/A 03/18/2019   Procedure: LEFT HEART CATH AND CORONARY ANGIOGRAPHY;  Surgeon: Iran Ouch, MD;  Location: [facility] INVASIVE CV LAB;  Service: Cardiovascular;  Laterality: N/A;    Allergies  Allergies  Allergen Reactions  . Amlodipine Swelling    Patient states he had an allergic reaction and was possibly from amlodipine. Taken off that and switched to metoprolol.   . Imdur [Isosorbide Nitrate] Other (See Comments)    Severe headache    History of Present Illness    Jeff Rios is a 54 y.o. male with a hx of CAD with NSTEMI 03/18/2019 s/p PCI/DES to RCA and LAD, HTN, HLD, tobacco abuse.  He was last seen  10/24/19.   He was seen in the ED 03/05/2019 with idiopathic angioedema and treated steroids.  Some concern that was related to amlodipine and it was discontinued and transitioned to metoprolol.  Present in the hospital 03/18/2019 severe substernal chest discomfort woke him from sleep.  EKG with NSR with less than 1 mm ST elevation in lead III withST elevation in other leads with anterolateral T wave inversion in my ST depression in leads I and aVL.  Initial high-sensitivity troponin 59, delta 210, peak 24,643.  Emergent left heart cath showing significant two-vessel CAD with culprit being occluded distal RCA with faint left-to-right collaterals.  Also significant mid LAD stenosis which appeared to be hazy.  LV systolic function was beadlike appearance of the LV and mid cavity gradient suggested a variant of hypertrophic cardiomyopathy was significantly elevated LVEDP in the low 30s.  Underwent successful PCI/DES to distal RCA.  Echo with LVEF 60 to 65%, mild LVH, grade 1 diastolic dysfunction, normal RV systolic function and cavity size, no significant valvular abnormality.  Underwent staged PCI to the mid LAD 03/20/2019.  On review his echo images showed no significant LVH with only mild gradient leading to no further work-up.  ED visit 03/26/2019 with abdominal bruising at site of Lovenox/heparin injections.  Work-up unremarkable with stable CBC.  Seen for follow-up in office 1/20 without any anginal symptoms and amlodipine 5 mg daily was added in setting of mildly elevated BP 140/82.  At office visit in February his amlodipine was uptitrated to 7.5 mg daily.  At clinic visits 09/05/2019, 09/19/2019, 10/24/19 multiple efforts have been made to consolidate his medication regimen as he was having multiple issues with cost.  Social work assistance appreciated as they were able to get him a $50 [employer] gift card and send BP cuff.  His amlodipine, Zetia were discontinued due to cost and as he was not taking.   Atorvastatin was resumed. Prasugrel was changed to clopidogrel. He was started on Imdur and reported headache so this was discontinued.   Present regimen includes ASA 81mg  daily, Coreg 3.125mg  BID, Lisinopril 20mg  daily, Amlodipine 10mg  daily, Plavix 75mg  daily, Atorvastatin 80mg  daily.  Reports no recurrent headache over the last 2 weeks. Continues to note cost difficulties with mediations. Unclear which medications he is out of, though he thinks it is his "blood thinner".   Reports no chest pain, pressure, or tightness. Reports sometimes he needs a "deep breath" but no shortness of breath. No edema, orthopnea, PND. Reports no palpitations.   Spent >30 minutes on phone with pharmacy at his primary care office as they were assisting him with pill packs.   EKGs/Labs/Other Studies Reviewed:   The following studies were reviewed today:  LHC 03/18/2019:  The left ventricular systolic function is normal.  LV end diastolic pressure is normal.  The left ventricular ejection fraction is 55-65% by visual estimate.  Mid LAD lesion is 80% stenosed.  Prox RCA lesion is 30% stenosed.  Dist RCA lesion is 100% stenosed.  Post intervention, there is a 0% residual stenosis.  A drug-eluting stent was successfully placed using a STENT RESOLUTE ONYX 2.25X22.  RPDA lesion is 40% stenosed.   1.  Significant two-vessel coronary artery disease.  The culprit is an occluded distal right coronary artery with faint left-to-right collaterals.  The supplied area is not large and that is the likely reason for nondiagnostic EKG changes for inferior STEMI.  There is also significant mid LAD stenosis which appears to be hazy. 2.  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  Significantly elevated LVEDP in the low 30s. 3.  Successful angioplasty and drug-eluting stent placement to the distal RCA.     Recommendations: Continue dual antiplatelet therapy for at least 1 year.  Aggressive treatment of risk factors. Recommend staged PCI of the LAD before hospital discharge.  This is planned for Monday morning. __________   2D Echo 03/18/2019:  1. Left ventricular ejection fraction, by visual estimation, is 60 to 65%. The left ventricle has normal function. There is mildly increased left ventricular hypertrophy.  2. Left ventricular diastolic parameters are consistent with Grade I diastolic dysfunction (impaired relaxation).  3. Global right ventricle has normal systolic function.The right ventricular size is normal. No increase in right ventricular wall thickness.  4. The mitral valve is normal in structure. No evidence of mitral valve regurgitation. No evidence of mitral stenosis.  5. The tricuspid valve is normal in structure. Tricuspid valve regurgitation is not demonstrated.  6. The aortic valve is normal in structure. Aortic valve regurgitation is not visualized. No evidence of aortic valve sclerosis or stenosis.  7. The pulmonic valve was normal in structure. Pulmonic valve regurgitation is trivial.  8. TR signal is inadequate for assessing pulmonary artery systolic pressure.  9. The inferior vena cava is normal in size with greater than 50% respiratory variability, suggesting right atrial pressure of 3 mmHg. __________   PCI 03/20/2019:  RPDA lesion is 40% stenosed.  Prox RCA lesion is 30% stenosed.  Previously placed Dist RCA drug eluting stent is widely patent.  Balloon angioplasty was performed.  Mid LAD lesion is 80% stenosed.  Post intervention, there is a 0% residual stenosis.  A drug-eluting stent was successfully placed using a STENT RESOLUTE ONYX 3.0X15.   Successful angioplasty and drug-eluting stent placement to the mid LAD.   Recommendations: Continue dual antiplatelet therapy for at least 1 year. Aggressive treatment of risk factors. Likely discharge home  tomorrow. He is to stay off work for 2 weeks.  EKG: EKG ordered today demonstrates SR 57 bpm with stable lateral TWI and no acute ST/T wave changes.   Recent Labs: 09/05/2019: ALT 34 11/12/2019: BUN 11; Creatinine, Ser 1.22; Hemoglobin 13.8; Platelets 240; Potassium 3.4; Sodium 138  Recent Lipid Panel    Component Value Date/Time   CHOL 215 (H) 09/05/2019 1221   CHOL 200 (H) 06/07/2019 0922   TRIG 117 09/05/2019 1221   HDL 61 09/05/2019 1221   HDL 64 06/07/2019 0922   CHOLHDL 3.5 09/05/2019 1221   VLDL 23 09/05/2019 1221   LDLCALC 131 (H) 09/05/2019 1221   LDLCALC 122 (H) 06/07/2019 0922   Home Medications   Current Meds  Medication Sig  . Acetaminophen 500 MG capsule SMARTSIG:2 Capsule(s) By Mouth 3 Times Daily PRN  . amLODipine (NORVASC) 10 MG tablet Take 10 mg by mouth daily.  Marland Kitchen aspirin 81 MG EC tablet Take 1 tablet (81 mg total) by mouth daily.  Marland Kitchen atorvastatin (LIPITOR) 80 MG tablet Take 1 tablet (80 mg total) by mouth daily.  . butalbital-acetaminophen-caffeine (FIORICET) 50-325-40 MG tablet Take 1-2 tablets by mouth every 6 (six) hours as needed for headache.  . carvedilol (COREG) 3.125 MG tablet Take 1 tablet (3.125 mg total) by mouth 2 (two) times daily with a meal.  . clopidogrel (PLAVIX) 75 MG tablet Take 1 tablet (75 mg total) by mouth daily.  Marland Kitchen lisinopril (ZESTRIL) 20 MG tablet Take 1 tablet (20 mg total) by mouth daily.  . nitroGLYCERIN (NITROSTAT) 0.4 MG SL tablet Place 1 tablet (0.4 mg total) under the tongue every 5 (five) minutes as needed for chest pain.    Review of Systems   Review of Systems  Constitutional: Negative for chills, fever and malaise/fatigue.  Cardiovascular: Negative for chest pain, dyspnea on exertion, irregular heartbeat, leg swelling, near-syncope, orthopnea, palpitations and syncope.  Respiratory: Positive for shortness of breath ("needs deep breath sometimes"). Negative for cough and wheezing.   Gastrointestinal: Negative for melena,  nausea and vomiting.  Genitourinary: Negative for hematuria.  Neurological: Negative for dizziness, light-headedness and weakness.   All other systems reviewed and are otherwise negative except as noted above.  Physical Exam    VS:  BP (!) 134/98 (BP Location: Left Arm, Patient Position: Sitting, Cuff Size: Normal)   Pulse (!) 57   Ht 5\' 3"  (1.6 m)   Wt 186 lb 12.8 oz (84.7 kg)   SpO2 97%   BMI 33.09 kg/m  , BMI Body mass index is 33.09 kg/m. GEN: Well nourished, overweight, well developed, in no acute distress. HEENT: normal. Neck: Supple, no JVD, carotid bruits, or masses. Cardiac: RRR, no murmurs, rubs, or gallops. No clubbing, cyanosis. Trace bilateral LE edema.  Radials/PT 2+ and equal bilaterally.  Respiratory:  Respirations regular and unlabored, clear to auscultation bilaterally. GI: Soft, nontender, nondistended, BS + x 4. MS: No deformity or atrophy. Skin: Warm and dry, no rash. Neuro:  Strength and sensation are intact. Psych:  Normal affect.  Assessment & Plan   1. CAD -  PCI/DES RCA and staged PCI/DES LAD 03/2019. No anginal symptoms, no indication for repeat ischemic eval. Recommended for 12 mos DAPT starting 03/2019. Effient transitioned to PLavix for cost.. Medication management, as below - partnering with pharmacy at his PCP for assistance with pill packs. Present GDMT includes Aspirin (3 month supply provided in clinic 10/24/19), Plavix, Coreg, Atorvastatin, PRN Nitroglycerin.   2. LE edema - Echo 03/2019 with normal LVEF. Likely dependent edema, venous insufficiency due to long shifts standing. No recurrence since he began elevating his legs when sitting.  Elevate lower extremities, compression stockings, low salt diet recommended.   3. HTN - BP well controlled. Has cuff at home sent by SW team. Encouraged to monitor 1-2 times per week and keep log. Previously intolerant of Imdur with headache. Continue present regimen including Coreg 3.125 mg BID, Lisinopril 20mg   daily, Amlodipine 10mg  daily.   4. HLD, LDL goal <70 -06/07/19 normal liver function, LDL 122. Zetia 10mg  was added to Atorvastatin 80mg  daily.  However, he picked up Zetia one time and did not refill due to cost.  09/05/19 normal liver function, LDL 131.  Endorses missing doses of atorvastatin as the evening dosage was difficult and encouraged to take regularly in the morning. He has since resumed Atorvastatin 80mg  daily. Plan for repeat lipid/liver at follow up.    5. Tobacco use - Continued cessation encouraged. Recommend utilization of 1800QUITNOW.  6. Medication noncompliance/financial difficulties  - Asked to bring medication bottles to this visit, but he has not for last three visits.  Medications have been consolidated, as above. SW has sent BP cuff to home and $50 [employer] giftcard. Presently working with pharmacy at his PCP for pill packs. Unfortunately he just picked up a 90-day supply of amlodipine on 11/23/2019 and they will be unable to fill pill packs until 3 months from that date.  Disposition: Follow up up in 2 months with Dr. Kirke CorinArida as previously scheduled.  Alver Sorrowaitlin S Anabeth Chilcott, NP 12/07/2019, 11:42 AM

## 2019-12-07 NOTE — Patient Instructions (Addendum)
Medication Instructions:  No medication changes today.   If you need more of your Fioricet for headache, please call your primary care doctor.   *If you need a refill on your cardiac medications before your next appointment, please call your pharmacy*  Lab Work: No lab work today.   Testing/Procedures: Your EKG today was stable.  Follow-Up: At [facility], you and your health needs are our priority.  As part of our continuing mission to provide you with exceptional heart care, we have created designated Provider Care Teams.  These Care Teams include your primary Cardiologist (physician) and Advanced Practice Providers (APPs -  Physician Assistants and Nurse Practitioners) who all work together to provide you with the care you need, when you need it.  We recommend signing up for the patient portal called "MyChart".  Sign up information is provided on this After Visit Summary.  MyChart is used to connect with patients for Virtual Visits (Telemedicine).  Patients are able to view lab/test results, encounter notes, upcoming appointments, etc.  Non-urgent messages can be sent to your provider as well.   To learn more about what you can do with MyChart, go to [email or web address].    Your next appointment:  September 30th with Dr. Kirke Corin

## 2019-12-20 ENCOUNTER — Telehealth: Payer: Self-pay | Admitting: Cardiovascular Disease

## 2019-12-20 MED ORDER — CARVEDILOL 3.125 MG PO TABS: 3.1250 mg | ORAL_TABLET | Freq: Two times a day (BID) | ORAL | 11 refills | Status: DC

## 2019-12-20 MED ORDER — ASPIRIN 81 MG PO TBEC: 81.0000 mg | DELAYED_RELEASE_TABLET | Freq: Every day | ORAL | 11 refills | Status: AC

## 2019-12-20 MED ORDER — LISINOPRIL 20 MG PO TABS: 20.0000 mg | ORAL_TABLET | Freq: Every day | ORAL | 11 refills | Status: DC

## 2019-12-20 MED ORDER — AMLODIPINE BESYLATE 10 MG PO TABS
10.0000 mg | ORAL_TABLET | Freq: Every day | ORAL | 11 refills | Status: DC
Start: 2019-12-20 — End: 2020-07-26

## 2019-12-20 MED ORDER — ATORVASTATIN CALCIUM 80 MG PO TABS: 80.0000 mg | ORAL_TABLET | Freq: Every day | ORAL | 11 refills | Status: DC

## 2019-12-20 MED ORDER — CLOPIDOGREL BISULFATE 75 MG PO TABS: 75.0000 mg | ORAL_TABLET | Freq: Every day | ORAL | 11 refills | Status: DC

## 2019-12-20 NOTE — Telephone Encounter (Signed)
Patient calling  Patient is at pharmacy and is unable to afford medication at this time Would like to know if we have other options Please call to discuss

## 2019-12-20 NOTE — Telephone Encounter (Signed)
We've had multiple difficulties trying to get this gentleman his medications unfortunately and medication reconciliation has been difficult. 10/2019 he was provided $50 [employer] giftcard to help with medication assistance by our Heart and Vascular Social Work team. This giftcard would not work at his new pharmacy and I'm not sure they would be able to do a second. He has transitioned to [facility] as they could do pill packs.   I don't believe he has Medicare Part D. We could attempt to refer him to Medication management clinic? Though he would likely need assistance with the paperwork.   Is it the 30 or 90 day supply that is $40? He has recurrently had difficulties affording medications and we've moved him to generics and switched Prasugrel to Clopidogrel to try to get the cost down as much as possible. A 30-day supply may be more affordable for him.   Sorry that's not of much help.  Alver Sorrow, NP

## 2019-12-20 NOTE — Telephone Encounter (Signed)
Spoke with pharmacist at [facility] last week. He will not be able to pick up pill packs until approximately 01/2020 due to recent fill of Amlodipine.   To facilitate future pick ups, I have sent Rx for all of his cardiac meds to [facility] (community health clinic) today so they can assist to fill when able. Called them to confirm I was sending to correct location. They requested electronic send as their fax machine was not working.  Alver Sorrow, NP

## 2019-12-20 NOTE — Telephone Encounter (Signed)
Call received from [facility] with [facility].  She states that she explained to the patient this morning that they were trying to run his Medicaid and it was not going through. She had advised the patient that he needed to call his Case Worker to clarify what is going on with his Medicaid and get his ID information. She did not advise him to call us.   I advised that we have Medicare on file, but that he has part A & B. However, I do not see an actual copy of his Medicare on file.   I advised I would call the patient back and advise him of the above information.   I attempted to call the patient. No answer- the voice mail box is full.

## 2019-12-20 NOTE — Telephone Encounter (Signed)
The patient called back. I advised him of my conversation with the pharmacy. He is aware that he needs to contact his case worker/ whomever he typically speaks with about his insurance to clarify what is active for him. I advised he will then need to contact the pharmacy with his correct insurance information so this can be filed appropriately to run his prescriptions through.  The patient advised he will do this. I advised he should call today. He asked if we could give him a "gift card" like we had before. I advised the patient I will need to check into this and will have to let him know, but cannot guarantee this is an option or how long this would take to get to him. He was again advised to call his insurance today and contact the pharmacy ASAP with this information.  The patient voiced understanding.

## 2019-12-20 NOTE — Telephone Encounter (Signed)
I called and spoke with the patient. I advised we had received the message below. I asked if there was a particular medication that he could not afford as all of his cardiac medications are generic.   Per the patient, the pharmacy told him that his prescriptions were going to cost him $40 and can't afford this. He mentioned that they were having trouble with his Medicaid, but he was somewhat hard to understand on the phone.   I advised I would need to call the pharmacy to and clarify with them what the cost of each of his medications are.  I advised I would need to call him back after further discussion and review.  The patient voices understanding and is agreeable.  I called the pharmacy and was advised that another staff member had had a conversation with the patient in detail earlier today about his medication cost. They had tried to run his Medicaid, but could not get this to go through. I advised that we do not even have Medicaid on file for the patient, but that he has Medicare A & B.  Per pharmacy staff, they will have the person that was speaking with the patient earlier today call me back to discuss in further detail.

## 2019-12-27 ENCOUNTER — Telehealth: Payer: Self-pay | Admitting: *Deleted

## 2019-12-27 NOTE — Telephone Encounter (Signed)
-----   Message from Daneil Dolin sent at 12/22/2019  4:09 PM EDT ----- Riesa Pope,  Sorry, just saw this message on patient.   I reveiwed the notes:  If this patient has Medicaid he is Not Eligible for our program.  If he has Medicare--he would be eligible to sign up for Medicare Part D -(Pharmacy coverage) that would cover his medications. His Child psychotherapist with DSS should be able to help patient with Part D. He may be eligible for help in paying for his Part D coverage.  Thanks, Drinda Butts

## 2019-12-27 NOTE — Telephone Encounter (Signed)
No answer/Voicemail box is full.  

## 2019-12-29 NOTE — Telephone Encounter (Signed)
Unable to lmtcb. Pt voicemail is full.

## 2019-12-29 NOTE — Telephone Encounter (Signed)
Duplicate encounter see 12/27/19 telephone encounter.

## 2020-01-05 NOTE — Telephone Encounter (Signed)
3rd attempt to contact the patient by phone. Unable to lmtcb. Pt voicemail is full. 3 unsuccessful attempts to reach this patient by telephone. Closing this encounter.

## 2020-02-01 ENCOUNTER — Other Ambulatory Visit: Payer: Self-pay

## 2020-02-01 ENCOUNTER — Encounter: Payer: Self-pay | Admitting: Cardiovascular Disease

## 2020-02-01 ENCOUNTER — Ambulatory Visit (INDEPENDENT_AMBULATORY_CARE_PROVIDER_SITE_OTHER): Payer: Medicare Other | Admitting: Cardiovascular Disease

## 2020-02-01 VITALS — BP 112/80 | HR 81 | Ht 63.0 in | Wt 184.1 lb

## 2020-02-01 DIAGNOSIS — E785 Hyperlipidemia, unspecified: Secondary | ICD-10-CM | POA: Diagnosis not present

## 2020-02-01 DIAGNOSIS — I2510 Atherosclerotic heart disease of native coronary artery without angina pectoris: Secondary | ICD-10-CM | POA: Diagnosis not present

## 2020-02-01 DIAGNOSIS — I10 Essential (primary) hypertension: Secondary | ICD-10-CM | POA: Diagnosis not present

## 2020-02-01 MED ORDER — CARVEDILOL 3.125 MG PO TABS: 3.1250 mg | ORAL_TABLET | Freq: Two times a day (BID) | ORAL | 11 refills | Status: DC

## 2020-02-01 MED ORDER — LISINOPRIL 20 MG PO TABS: 20.0000 mg | ORAL_TABLET | Freq: Every day | ORAL | 11 refills | Status: DC

## 2020-02-01 MED ORDER — ATORVASTATIN CALCIUM 80 MG PO TABS: 80.0000 mg | ORAL_TABLET | Freq: Every day | ORAL | 11 refills | Status: DC

## 2020-02-01 NOTE — Progress Notes (Signed)
Cardiology Office Note   Date:  02/01/2020   ID:  Jeff Rios, DOB 1966-01-26, MRN [id number]  PCP:  [facility]  Cardiologist:   Lorine Bears, MD   Chief Complaint  Patient presents with  . Follow-up    3 month. Meds reviewed by the pt. verbally. Pt. c/o shortness of breath in the begining of his walk but once he has walked about 1 mile, he does seem to feel much better.       History of Present Illness: Jeff Rios is a 54 y.o. male who presents for a follow-up visit regarding coronary artery disease. He has chronic medical conditions including hypertension, hyperlipidemia and tobacco use. 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  There was a concern about possible reaction to amlodipine and it was switched to metoprolol.  He was hospitalized in November 2020 with inferior ST elevation myocardial infarction.  Cardiac catheterization showed significant two-vessel coronary artery disease with an occluded distal right coronary artery and significant mid LAD stenosis.  Left ventricular angiography was suggestive of hypertrophic cardiomyopathy with mid cavity gradient.  He underwent successful PCI and drug-eluting stent placement to the distal RCA and staged LAD PCI.  Echocardiogram showed an EF of 60 to 65% with no significant valvular abnormalities.  He has been doing well with no recent chest pain or worsening dyspnea.  No palpitations.  He ran out of atorvastatin, carvedilol and clopidogrel.  He continues to have difficulty obtaining his medication due to lack of transportation and cost of co-pays. He quit smoking 2 months ago.  Past Medical History:  Diagnosis Date  . CAD (coronary artery disease)   . HLD (hyperlipidemia)   . Hypertension     Past Surgical History:  Procedure Laterality Date  . CARDIAC CATHETERIZATION    . CORONARY STENT INTERVENTION N/A 03/18/2019   Procedure: CORONARY STENT INTERVENTION;   Surgeon: Iran Ouch, MD;  Location: [facility] INVASIVE CV LAB;  Service: Cardiovascular;  Laterality: N/A;  . CORONARY STENT INTERVENTION N/A 03/20/2019   Procedure: CORONARY STENT INTERVENTION;  Surgeon: Iran Ouch, MD;  Location: [facility] INVASIVE CV LAB;  Service: Cardiovascular;  Laterality: N/A;  . CORONARY/GRAFT ACUTE MI REVASCULARIZATION N/A 03/18/2019   Procedure: Coronary/Graft Acute MI Revascularization;  Surgeon: Iran Ouch, MD;  Location: [facility] INVASIVE CV LAB;  Service: Cardiovascular;  Laterality: N/A;  . LEFT HEART CATH AND CORONARY ANGIOGRAPHY N/A 03/18/2019   Procedure: LEFT HEART CATH AND CORONARY ANGIOGRAPHY;  Surgeon: Iran Ouch, MD;  Location: [facility] INVASIVE CV LAB;  Service: Cardiovascular;  Laterality: N/A;     Current Outpatient Medications  Medication Sig Dispense Refill  . Acetaminophen 500 MG capsule SMARTSIG:2 Capsule(s) By Mouth 3 Times Daily PRN    . amLODipine (NORVASC) 10 MG tablet Take 1 tablet (10 mg total) by mouth daily. 30 tablet 11  . aspirin 81 MG EC tablet Take 1 tablet (81 mg total) by mouth daily. 30 tablet 11  . atorvastatin (LIPITOR) 80 MG tablet Take 1 tablet (80 mg total) by mouth daily. 30 tablet 11  . butalbital-acetaminophen-caffeine (FIORICET) 50-325-40 MG tablet Take 1-2 tablets by mouth every 6 (six) hours as needed for headache. 20 tablet 0  . carvedilol (COREG) 3.125 MG tablet Take 1 tablet (3.125 mg total) by mouth 2 (two) times daily with a meal. 60 tablet 11  . clopidogrel (PLAVIX) 75 MG tablet Take 1 tablet (75 mg total) by mouth daily. 30 tablet 11  .  lisinopril (ZESTRIL) 20 MG tablet Take 1 tablet (20 mg total) by mouth daily. 30 tablet 11  . nitroGLYCERIN (NITROSTAT) 0.4 MG SL tablet Place 1 tablet (0.4 mg total) under the tongue every 5 (five) minutes as needed for chest pain. 30 tablet 12   No current facility-administered medications for this visit.    Allergies:   Amlodipine and Imdur [isosorbide nitrate]     Social History:  The patient  reports that he has quit smoking. His smoking use included cigarettes. He has a 30.00 pack-year smoking history. He has never used smokeless tobacco. He reports that he does not drink alcohol and does not use drugs.   Family History:  The patient's Family history is unknown by patient.    ROS:  Please see the history of present illness.   Otherwise, review of systems are positive for none.   All other systems are reviewed and negative.    PHYSICAL EXAM: VS:  BP 112/80 (BP Location: Left Arm, Patient Position: Sitting, Cuff Size: Normal)   Pulse 81   Ht 5\' 3"  (1.6 m)   Wt 184 lb 2 oz (83.5 kg)   SpO2 98%   BMI 32.62 kg/m  , BMI Body mass index is 32.62 kg/m. GEN: Well nourished, well developed, in no acute distress  HEENT: normal  Neck: no JVD, carotid bruits, or masses Cardiac: RRR; no murmurs, rubs, or gallops,no edema  Respiratory:  clear to auscultation bilaterally, normal work of breathing GI: soft, nontender, nondistended, + BS MS: no deformity or atrophy  Skin: warm and dry, no rash Neuro:  Strength and sensation are intact Psych: euthymic mood, full affect   EKG:  EKG is ordered today. The ekg ordered today demonstrates normal sinus rhythm with possible left atrial enlargement, lateral T wave changes suggestive of ischemia.   Recent Labs: 09/05/2019: ALT 34 11/12/2019: BUN 11; Creatinine, Ser 1.22; Hemoglobin 13.8; Platelets 240; Potassium 3.4; Sodium 138    Lipid Panel    Component Value Date/Time   CHOL 215 (H) 09/05/2019 1221   CHOL 200 (H) 06/07/2019 0922   TRIG 117 09/05/2019 1221   HDL 61 09/05/2019 1221   HDL 64 06/07/2019 0922   CHOLHDL 3.5 09/05/2019 1221   VLDL 23 09/05/2019 1221   LDLCALC 131 (H) 09/05/2019 1221   LDLCALC 122 (H) 06/07/2019 0922      Wt Readings from Last 3 Encounters:  02/01/20 184 lb 2 oz (83.5 kg)  12/07/19 186 lb 12.8 oz (84.7 kg)  11/12/19 189 lb 9.5 oz (86 kg)        No flowsheet data  found.    ASSESSMENT AND PLAN:  1.  Coronary artery disease involving native coronary arteries without angina: He is doing well with no anginal symptoms.  He ran out of clopidogrel.  I discussed with him the importance of compliance and will send refills on try to help.  2.  Essential hypertension: Blood pressures well controlled.  3.  Hyperlipidemia: He ran out of atorvastatin and this was refilled.  Most recent lipid profile showed an LDL of 131 but there has been issues with his compliance.  4.  Tobacco use: He quit smoking 2 months ago.    Disposition:   FU with me in 4 months  Signed,  01/13/20, MD  02/01/2020 9:34 AM    [facility]

## 2020-02-01 NOTE — Patient Instructions (Signed)
Medication Instructions:  Your physician recommends that you continue on your current medications as directed. Please refer to the Current Medication list given to you today.  Lisinopril 10 mg daily Carvedilol 3.125 mg twice daily Atorvastatin 80 mg daily  Have been refilled and sent to [facility] pharmacy for you to pick up.  *If you need a refill on your cardiac medications before your next appointment, please call your pharmacy*   Lab Work: None ordered If you have labs (blood work) drawn today and your tests are completely normal, you will receive your results only by: Marland Kitchen MyChart Message (if you have MyChart) OR . A paper copy in the mail If you have any lab test that is abnormal or we need to change your treatment, we will call you to review the results.   Testing/Procedures: None ordered   Follow-Up: At [facility], you and your health needs are our priority.  As part of our continuing mission to provide you with exceptional heart care, we have created designated Provider Care Teams.  These Care Teams include your primary Cardiologist (physician) and Advanced Practice Providers (APPs -  Physician Assistants and Nurse Practitioners) who all work together to provide you with the care you need, when you need it.  We recommend signing up for the patient portal called "MyChart".  Sign up information is provided on this After Visit Summary.  MyChart is used to connect with patients for Virtual Visits (Telemedicine).  Patients are able to view lab/test results, encounter notes, upcoming appointments, etc.  Non-urgent messages can be sent to your provider as well.   To learn more about what you can do with MyChart, go to [email or web address].    Your next appointment:   4 month(s)  The format for your next appointment:   In Person  Provider:   You may see Lorine Bears, MD or one of the following Advanced Practice Providers on your designated Care Team:    Nicolasa Ducking,  NP  Eula Listen, PA-C  Marisue Ivan, PA-C  Cadence Fransico Michael, [place]    Other Instructions N/A

## 2020-03-11 ENCOUNTER — Telehealth: Payer: Self-pay | Admitting: Licensed Clinical Social Worker

## 2020-03-11 NOTE — Telephone Encounter (Signed)
CSW called pt to inform that open enrollment for Medicare was available as patient doesn't currently have Medicare part D coverage and was struggling with paying for medications.   Unable to reach and unable to leave message- will attempt again later  Burna Sis, LCSW Clinical Social Worker [facility] Desk#: [phone] Cell#: [phone]

## 2020-03-15 ENCOUNTER — Telehealth: Payer: Self-pay | Admitting: Licensed Clinical Social Worker

## 2020-03-15 NOTE — Telephone Encounter (Signed)
CSW attempted to contact pt again to inform that Medicare was in open enrollment period so he could sign up for prescription coverage- unable to reach or leave VM  CSW texted patient reason for calling and provided with number for [facility] to call and receive help signing up for part D benefits.  Burna Sis, LCSW Clinical Social Worker [facility] Desk#: [phone] Cell#: [phone]

## 2020-05-05 ENCOUNTER — Encounter: Payer: Self-pay | Admitting: Emergency Medicine

## 2020-05-05 ENCOUNTER — Other Ambulatory Visit: Payer: Self-pay

## 2020-05-05 ENCOUNTER — Emergency Department
Admission: EM | Admit: 2020-05-05 | Discharge: 2020-05-05 | Disposition: A | Discharge status: Home / Self Care | Payer: Medicare Other | Attending: Emergency Medicine | Admitting: Emergency Medicine

## 2020-05-05 ENCOUNTER — Emergency Department: Payer: Medicare Other

## 2020-05-05 DIAGNOSIS — W208XXA Other cause of strike by thrown, projected or falling object, initial encounter: Secondary | ICD-10-CM | POA: Insufficient documentation

## 2020-05-05 DIAGNOSIS — Z79899 Other long term (current) drug therapy: Secondary | ICD-10-CM | POA: Diagnosis not present

## 2020-05-05 DIAGNOSIS — Z7982 Long term (current) use of aspirin: Secondary | ICD-10-CM | POA: Diagnosis not present

## 2020-05-05 DIAGNOSIS — I119 Hypertensive heart disease without heart failure: Secondary | ICD-10-CM | POA: Diagnosis not present

## 2020-05-05 DIAGNOSIS — S99911A Unspecified injury of right ankle, initial encounter: Secondary | ICD-10-CM | POA: Diagnosis present

## 2020-05-05 DIAGNOSIS — Z87891 Personal history of nicotine dependence: Secondary | ICD-10-CM | POA: Diagnosis not present

## 2020-05-05 DIAGNOSIS — Y990 Civilian activity done for income or pay: Secondary | ICD-10-CM | POA: Diagnosis not present

## 2020-05-05 DIAGNOSIS — I2510 Atherosclerotic heart disease of native coronary artery without angina pectoris: Secondary | ICD-10-CM | POA: Diagnosis not present

## 2020-05-05 DIAGNOSIS — S93401A Sprain of unspecified ligament of right ankle, initial encounter: Secondary | ICD-10-CM | POA: Diagnosis not present

## 2020-05-05 MED ORDER — MELOXICAM 15 MG PO TABS: 15.0000 mg | ORAL_TABLET | Freq: Every day | ORAL | 0 refills | Status: AC

## 2020-05-05 MED ORDER — MELOXICAM 7.5 MG PO TABS
15.0000 mg | ORAL_TABLET | Freq: Once | ORAL | Status: AC
  Administered 2020-05-05: 15 mg via ORAL
  Filled 2020-05-05: qty 2

## 2020-05-05 NOTE — ED Provider Notes (Signed)
[facility] Emergency Department Provider Note  ____________________________________________   Event Date/Time   First MD Initiated Contact with Patient 05/05/20 1519     (approximate)  I have reviewed the triage vital signs and the nursing notes.   HISTORY  Chief Complaint Leg Injury  HPI Jeff Rios is a 55 y.o. male who presents to the emergency department for evaluation of right foot and ankle pain and swelling.  The patient states that on Monday, 04/29/2020, the patient was involved in an injury at work when a pallet struck the back of his right ankle in the region of the Achilles tendon.  He states that he was seen on Wednesday,05/01/2020, at an urgent care, where he was prescribed a medication.  He is unsure what medication this was but states that it has not been helping.  He also states that no x-ray was obtained at that time.  He reports to the emergency department today due to persistent pain, ecchymosis and swelling of the region.  He has been weightbearing on it, though this increases his pain.  He rates his pain at rest a 3/10, worse with weightbearing or movement of the right ankle.         Past Medical History:  Diagnosis Date  . CAD (coronary artery disease)   . HLD (hyperlipidemia)   . Hypertension     Patient Active Problem List   Diagnosis Date Noted  . HTN (hypertension) 03/19/2019  . HLD (hyperlipidemia) 03/19/2019  . Chest pain 03/18/2019  . ACS (acute coronary syndrome) (HCC) 03/18/2019  . Non-ST elevation (NSTEMI) myocardial infarction [facility])     Past Surgical History:  Procedure Laterality Date  . CARDIAC CATHETERIZATION    . CORONARY STENT INTERVENTION N/A 03/18/2019   Procedure: CORONARY STENT INTERVENTION;  Surgeon: Iran Ouch, MD;  Location: [facility] INVASIVE CV LAB;  Service: Cardiovascular;  Laterality: N/A;  . CORONARY STENT INTERVENTION N/A 03/20/2019   Procedure: CORONARY STENT INTERVENTION;  Surgeon:  Iran Ouch, MD;  Location: [facility] INVASIVE CV LAB;  Service: Cardiovascular;  Laterality: N/A;  . CORONARY/GRAFT ACUTE MI REVASCULARIZATION N/A 03/18/2019   Procedure: Coronary/Graft Acute MI Revascularization;  Surgeon: Iran Ouch, MD;  Location: [facility] INVASIVE CV LAB;  Service: Cardiovascular;  Laterality: N/A;  . LEFT HEART CATH AND CORONARY ANGIOGRAPHY N/A 03/18/2019   Procedure: LEFT HEART CATH AND CORONARY ANGIOGRAPHY;  Surgeon: Iran Ouch, MD;  Location: [facility] INVASIVE CV LAB;  Service: Cardiovascular;  Laterality: N/A;    Prior to Admission medications   Medication Sig Start Date End Date Taking? Authorizing Provider  meloxicam (MOBIC) 15 MG tablet Take 1 tablet (15 mg total) by mouth daily for 15 days. 05/05/20 05/20/20 Yes Lucy Chris, PA  Acetaminophen 500 MG capsule SMARTSIG:2 Capsule(s) By Mouth 3 Times Daily PRN 11/07/19   [provider]  amLODipine (NORVASC) 10 MG tablet Take 1 tablet (10 mg total) by mouth daily. 12/20/19 12/14/20  Alver Sorrow, NP  aspirin 81 MG EC tablet Take 1 tablet (81 mg total) by mouth daily. 12/20/19 12/14/20  Alver Sorrow, NP  atorvastatin (LIPITOR) 80 MG tablet Take 1 tablet (80 mg total) by mouth daily. 02/01/20 01/26/21  Iran Ouch, MD  butalbital-acetaminophen-caffeine (FIORICET) [phone] MG tablet Take 1-2 tablets by mouth every 6 (six) hours as needed for headache. 11/13/19 11/12/20  Don Perking, [place], MD  carvedilol (COREG) 3.125 MG tablet Take 1 tablet (3.125 mg total) by mouth 2 (two) times daily with a  meal. 02/01/20 01/26/21  Wellington Hampshire, MD  clopidogrel (PLAVIX) 75 MG tablet Take 1 tablet (75 mg total) by mouth daily. 12/20/19   Loel Dubonnet, NP  lisinopril (ZESTRIL) 20 MG tablet Take 1 tablet (20 mg total) by mouth daily. 02/01/20 01/26/21  Wellington Hampshire, MD  nitroGLYCERIN (NITROSTAT) 0.4 MG SL tablet Place 1 tablet (0.4 mg total) under the tongue every 5 (five) minutes as needed for chest pain.  03/21/19 09/18/28  Spongberg, Audie Pinto, MD    Allergies Amlodipine and Imdur [isosorbide nitrate]  Family History  Family history unknown: Yes    Social History Social History   Tobacco Use  . Smoking status: Former Smoker    Packs/day: 1.00    Years: 30.00    Pack years: 30.00    Types: Cigarettes  . Smokeless tobacco: Never Used  . Tobacco comment: ready to quit. Havent smoked in about 4 weeks.  Vaping Use  . Vaping Use: Never used  Substance Use Topics  . Alcohol use: No  . Drug use: No    Review of Systems Constitutional: No fever/chills Eyes: No visual changes. ENT: No sore throat. Cardiovascular: Denies chest pain. Respiratory: Denies shortness of breath. Gastrointestinal: No abdominal pain.  No nausea, no vomiting.  No diarrhea.  No constipation. Genitourinary: Negative for dysuria. Musculoskeletal: + Right foot and ankle pain, negative for back pain. Skin: Negative for rash. Neurological: Negative for headaches, focal weakness or numbness.  ____________________________________________   PHYSICAL EXAM:  VITAL SIGNS: ED Triage Vitals  Enc Vitals Group     BP 05/05/20 1416 118/69     Pulse Rate 05/05/20 1416 71     Resp 05/05/20 1416 17     Temp 05/05/20 1416 98.1 F (36.7 C)     Temp Source 05/05/20 1416 Oral     SpO2 05/05/20 1416 97 %     Weight 05/05/20 1415 180 lb (81.6 kg)     Height 05/05/20 1415 5\' 3"  (1.6 m)     Head Circumference --      Peak Flow --      Pain Score 05/05/20 1415 3     Pain Loc --      Pain Edu? --      Excl. in [place]? --    Constitutional: Alert and oriented. Well appearing and in no acute distress. Eyes: Conjunctivae are normal. PERRL. EOMI. Head: Atraumatic. Nose: No congestion/rhinnorhea. Mouth/Throat: Mucous membranes are moist.  Oropharynx non-erythematous. Neck: No stridor.   Cardiovascular: Normal rate, regular rhythm. Grossly normal heart sounds.  Good peripheral circulation. Respiratory: Normal  respiratory effort.  No retractions. Lungs CTAB. Gastrointestinal: Soft and nontender. No distention. No abdominal bruits. No CVA tenderness. Musculoskeletal: There is extensive diffuse swelling about the right foot and ankle that extends two thirds of the way up the lower leg.  He has ecchymosis present on the posterior lateral, posterior medial ankle as well as the base of the fifth metatarsal.  His tenderness to palpation is diffuse, most prominent over the posterior ankle region.  Thompson's test does produce dorsiflexion, but not equal to that of contralateral side.  Dorsal pedal pulses 2+, sensation normal as reported by patient.  Patient does have active plantar flexion, dorsiflexion though ROM is limited by pain. Neurologic:  Normal speech and language. No gross focal neurologic deficits are appreciated.  Gait not assessed secondary to right lower extremity injury Skin:  Skin is warm, dry and intact. No rash noted. Psychiatric: Mood and affect are  normal. Speech and behavior are normal.  ____________________________________________  RADIOLOGY I, Lucy Chris, personally viewed and evaluated these images (plain radiographs) as part of my medical decision making, as well as reviewing the written report by the radiologist.  ED provider interpretation: No acute fracture identified  Official radiology report(s): DG Ankle Complete Right  Result Date: 05/05/2020 CLINICAL DATA:  Right ankle pain, swelling, and bruising after injury. EXAM: RIGHT ANKLE - COMPLETE 3+ VIEW COMPARISON:  None. FINDINGS: There is no evidence of fracture, dislocation, or joint effusion. There is no evidence of arthropathy or other focal bone abnormality. Soft tissues are unremarkable. IMPRESSION: Negative. Electronically Signed   By: Burman Nieves M.D.   On: 05/05/2020 16:30   US Venous Img Lower Unilateral Right  Result Date: 05/05/2020 CLINICAL DATA:  Right lower extremity pain and swelling. History of a fall.  Color changes and ulcerations. EXAM: Right LOWER EXTREMITY VENOUS DOPPLER ULTRASOUND TECHNIQUE: Gray-scale sonography with compression, as well as color and duplex ultrasound, were performed to evaluate the deep venous system(s) from the level of the common femoral vein through the popliteal and proximal calf veins. COMPARISON:  None. FINDINGS: VENOUS Normal compressibility of the common femoral, superficial femoral, and popliteal veins, as well as the visualized calf veins. Visualized portions of profunda femoral vein and great saphenous vein unremarkable. No filling defects to suggest DVT on grayscale or color Doppler imaging. Doppler waveforms show normal direction of venous flow, normal respiratory plasticity and response to augmentation. Limited views of the contralateral common femoral vein are unremarkable. OTHER None. Limitations: none IMPRESSION: No evidence of acute deep venous thrombosis demonstrated in the visualized lower extremity. Electronically Signed   By: Burman Nieves M.D.   On: 05/05/2020 16:48    ____________________________________________   INITIAL IMPRESSION / ASSESSMENT AND PLAN / ED COURSE  As part of my medical decision making, I reviewed the following data within the electronic MEDICAL RECORD NUMBER Nursing notes reviewed and incorporated and Radiograph reviewed         Patient is a 55 year old male who presents to the emergency department 6 days following injury that occurred at work.  See HPI for further details.  On physical exam, the patient does have quite a bit of swelling to the right lower extremity, from approximately the mid lower leg through the right foot and ankle.  He also has quite a bit of ecchymosis and decreased range of motion.  The patient's Janee Morn test does produce dorsiflexion though its decreased compared to contralateral side.  He is able to actively dorsiflex and plantarflex his right ankle without much difficulty.  The patient is also been  weightbearing on this injury.  Given that is been 6 days, with pain and swelling persisting for the patient, we will place the patient in a cam walker boot for suspected ankle sprain versus Achilles injury.  We will have the patient take Mobic once daily for his symptoms and swelling.  The patient will have follow-up with podiatry regarding his right ankle.  Patient was encouraged to return if he has any further acute worsening.      ____________________________________________   FINAL CLINICAL IMPRESSION(S) / ED DIAGNOSES  Final diagnoses:  Sprain of right ankle, unspecified ligament, initial encounter     ED Discharge Orders         Ordered    meloxicam (MOBIC) 15 MG tablet  Daily        05/05/20 1727          *Please  note:  Noa Constante was evaluated in Emergency Department on 05/05/2020 for the symptoms described in the history of present illness. He was evaluated in the context of the global COVID-19 pandemic, which necessitated consideration that the patient might be at risk for infection with the SARS-CoV-2 virus that causes COVID-19. Institutional protocols and algorithms that pertain to the evaluation of patients at risk for COVID-19 are in a state of rapid change based on information released by regulatory bodies including the CDC and federal and state organizations. These policies and algorithms were followed during the patient's care in the ED.  Some ED evaluations and interventions may be delayed as a result of limited staffing during and the pandemic.*   Note:  This document was prepared using Dragon voice recognition software and may include unintentional dictation errors.    Lucy Chris, PA 05/05/20 1857    Merwyn Katos, MD 05/05/20 [id number]

## 2020-05-05 NOTE — ED Triage Notes (Signed)
Pt reports was at work and hurt his right leg. Pt reports was seen at the walk in clinic on Wednesday for it but continues to have pain.

## 2020-05-10 ENCOUNTER — Ambulatory Visit: Payer: Medicare Other | Admitting: Physician Assistant

## 2020-05-15 ENCOUNTER — Ambulatory Visit: Payer: Medicare Other | Admitting: Physician Assistant

## 2020-05-16 NOTE — Progress Notes (Incomplete)
Office Visit    Patient Name: Jeff Rios Date of Encounter: 05/16/2020  Primary Care Provider:  Center, [facility] Primary Cardiologist:  Lorine Bears, MD  Chief Complaint    No chief complaint on file.   55 yo male with history of   Past Medical History    Past Medical History:  Diagnosis Date  . CAD (coronary artery disease)   . HLD (hyperlipidemia)   . Hypertension    Past Surgical History:  Procedure Laterality Date  . CARDIAC CATHETERIZATION    . CORONARY STENT INTERVENTION N/A 03/18/2019   Procedure: CORONARY STENT INTERVENTION;  Surgeon: Iran Ouch, MD;  Location: [facility] INVASIVE CV LAB;  Service: Cardiovascular;  Laterality: N/A;  . CORONARY STENT INTERVENTION N/A 03/20/2019   Procedure: CORONARY STENT INTERVENTION;  Surgeon: Iran Ouch, MD;  Location: [facility] INVASIVE CV LAB;  Service: Cardiovascular;  Laterality: N/A;  . CORONARY/GRAFT ACUTE MI REVASCULARIZATION N/A 03/18/2019   Procedure: Coronary/Graft Acute MI Revascularization;  Surgeon: Iran Ouch, MD;  Location: [facility] INVASIVE CV LAB;  Service: Cardiovascular;  Laterality: N/A;  . LEFT HEART CATH AND CORONARY ANGIOGRAPHY N/A 03/18/2019   Procedure: LEFT HEART CATH AND CORONARY ANGIOGRAPHY;  Surgeon: Iran Ouch, MD;  Location: [facility] INVASIVE CV LAB;  Service: Cardiovascular;  Laterality: N/A;    Allergies  Allergies  Allergen Reactions  . Amlodipine Swelling    Patient states he had an allergic reaction and was possibly from amlodipine. Taken off that and switched to metoprolol.   . Imdur [Isosorbide Nitrate] Other (See Comments)    Severe headache    History of Present Illness    Jeff Rios is a 55 y.o. male with PMH as above. ***  Home Medications    Current Outpatient Medications on File Prior to Visit  Medication Sig Dispense Refill  . Acetaminophen 500 MG capsule SMARTSIG:2 Capsule(s) By Mouth 3 Times Daily PRN    . amLODipine  (NORVASC) 10 MG tablet Take 1 tablet (10 mg total) by mouth daily. 30 tablet 11  . aspirin 81 MG EC tablet Take 1 tablet (81 mg total) by mouth daily. 30 tablet 11  . atorvastatin (LIPITOR) 80 MG tablet Take 1 tablet (80 mg total) by mouth daily. 30 tablet 11  . butalbital-acetaminophen-caffeine (FIORICET) 50-325-40 MG tablet Take 1-2 tablets by mouth every 6 (six) hours as needed for headache. 20 tablet 0  . carvedilol (COREG) 3.125 MG tablet Take 1 tablet (3.125 mg total) by mouth 2 (two) times daily with a meal. 60 tablet 11  . clopidogrel (PLAVIX) 75 MG tablet Take 1 tablet (75 mg total) by mouth daily. 30 tablet 11  . lisinopril (ZESTRIL) 20 MG tablet Take 1 tablet (20 mg total) by mouth daily. 30 tablet 11  . meloxicam (MOBIC) 15 MG tablet Take 1 tablet (15 mg total) by mouth daily for 15 days. 15 tablet 0  . nitroGLYCERIN (NITROSTAT) 0.4 MG SL tablet Place 1 tablet (0.4 mg total) under the tongue every 5 (five) minutes as needed for chest pain. 30 tablet 12   No current facility-administered medications on file prior to visit.    Review of Systems    ***.   All other systems reviewed and are otherwise negative except as noted above.  Physical Exam    VS:  There were no vitals taken for this visit. , BMI There is no height or weight on file to calculate BMI. GEN: Well nourished, well developed, in no acute distress.  HEENT: normal. Neck: Supple, no JVD, carotid bruits, or masses. Cardiac: RRR, no murmurs, rubs, or gallops. No clubbing, cyanosis, edema.  Radials/DP/PT 2+ and equal bilaterally.  Respiratory:  Respirations regular and unlabored, clear to auscultation bilaterally. GI: Soft, nontender, nondistended, BS + x 4. MS: no deformity or atrophy. Skin: warm and dry, no rash. Neuro:  Strength and sensation are intact. Psych: Normal affect.  Accessory Clinical Findings    ECG personally reviewed by me today - *** - no acute changes.  VITALS Reviewed today   Temp Readings  from Last 3 Encounters:  05/05/20 98.1 F (36.7 C) (Oral)  11/12/19 97.9 F (36.6 C) (Oral)  04/14/19 (!) 97.5 F (36.4 C)   BP Readings from Last 3 Encounters:  05/05/20 124/81  02/01/20 112/80  12/07/19 (!) 134/98   Pulse Readings from Last 3 Encounters:  05/05/20 85  02/01/20 81  12/07/19 (!) 57    Wt Readings from Last 3 Encounters:  05/05/20 180 lb (81.6 kg)  02/01/20 184 lb 2 oz (83.5 kg)  12/07/19 186 lb 12.8 oz (84.7 kg)     LABS  reviewed today   Lab Results  Component Value Date   WBC 5.8 11/12/2019   HGB 13.8 11/12/2019   HCT 37.3 (L) 11/12/2019   MCV 77.9 (L) 11/12/2019   PLT 240 11/12/2019   Lab Results  Component Value Date   CREATININE 1.22 11/12/2019   BUN 11 11/12/2019   NA 138 11/12/2019   K 3.4 (L) 11/12/2019   CL 109 11/12/2019   CO2 23 11/12/2019   Lab Results  Component Value Date   ALT 34 09/05/2019   AST 25 09/05/2019   ALKPHOS 60 09/05/2019   BILITOT 1.1 09/05/2019   Lab Results  Component Value Date   CHOL 215 (H) 09/05/2019   HDL 61 09/05/2019   LDLCALC 131 (H) 09/05/2019   TRIG 117 09/05/2019   CHOLHDL 3.5 09/05/2019    Lab Results  Component Value Date   HGBA1C 5.4 03/18/2019   No results found for: TSH   STUDIES/PROCEDURES reviewed today   ***  Assessment & Plan    ***  Medication changes: *** Labs ordered: *** Studies / Imaging ordered: *** Future considerations: *** Disposition: ***  Total time spent with patient today *** minutes. This includes reviewing records, evaluating the patient, and coordinating care. Face-to-face time >50%.    Lennon Alstrom, PA-C 05/16/2020

## 2020-05-16 NOTE — Progress Notes (Signed)
Office Visit    Patient Name: Jeff Rios Date of Encounter: 05/17/2020  Primary Care Provider:  Center, [facility] Primary Cardiologist:  Lorine Bears, MD  Chief Complaint    Chief Complaint  Patient presents with  . Follow-up    4 Months follow up. Medications verbally reviewed with patient.     55 yo male with history of CAD, NSTEMI 03/18/2019 s/p PCI/DES to RCA and LAD, HTN, HLD, tobacco use (quit July 2021), idiopathic angiedema, and who presents today for 3-4 mo. follow-up.  Past Medical History    Past Medical History:  Diagnosis Date  . CAD (coronary artery disease)   . HLD (hyperlipidemia)   . Hypertension    Past Surgical History:  Procedure Laterality Date  . CARDIAC CATHETERIZATION    . CORONARY STENT INTERVENTION N/A 03/18/2019   Procedure: CORONARY STENT INTERVENTION;  Surgeon: Iran Ouch, MD;  Location: [facility] INVASIVE CV LAB;  Service: Cardiovascular;  Laterality: N/A;  . CORONARY STENT INTERVENTION N/A 03/20/2019   Procedure: CORONARY STENT INTERVENTION;  Surgeon: Iran Ouch, MD;  Location: [facility] INVASIVE CV LAB;  Service: Cardiovascular;  Laterality: N/A;  . CORONARY/GRAFT ACUTE MI REVASCULARIZATION N/A 03/18/2019   Procedure: Coronary/Graft Acute MI Revascularization;  Surgeon: Iran Ouch, MD;  Location: [facility] INVASIVE CV LAB;  Service: Cardiovascular;  Laterality: N/A;  . LEFT HEART CATH AND CORONARY ANGIOGRAPHY N/A 03/18/2019   Procedure: LEFT HEART CATH AND CORONARY ANGIOGRAPHY;  Surgeon: Iran Ouch, MD;  Location: [facility] INVASIVE CV LAB;  Service: Cardiovascular;  Laterality: N/A;    Allergies  Allergies  Allergen Reactions  . Amlodipine Swelling    Patient states he had an allergic reaction and was possibly from amlodipine. Taken off that and switched to metoprolol.   . Imdur [Isosorbide Nitrate] Other (See Comments)    Severe headache    History of Present Illness    Jeff Rios is a  54 y.o. 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  Previously, there was concern regarding a possible reaction to amlodipine, subsequently switched to metoprolol.  He has subsequently been tolerating amlodipine.    He was admitted 03/2019 with inferior ST elevation myocardial infarction.  He reported severe substernal chest discomfort that woke him from sleep.  Cardiac catheterization showed significant 2v CAD with an occluded distal RCA and significant mLAD stenosis.  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  He underwent successful PCI and DES to the distal RCA with staged LAD PCI.    Echo showed EF 60 to 65% without significant valvular abnormalities.  He was last seen in clinic by his primary cardiologist, Dr. Kirke Corin, 02/01/2020.  He was doing well without CP or DOE.  He had run out of atorvastatin, carvedilol, and clopidogrel.  He continued to have difficulty obtaining his medications due to lack of transportation and cost of co-pays.  He had quit smoking 2 months prior.  On review of faxed PCP notes, he was last seen by his PCP 02/2020.  At that time it was noted that he was taking the amlodipine but not taking Imdur 2/2 HA.  HA was resolved with cessation of Imdur, though it was also noted he was taking Excedrin. BP by recent office visits 129/84, 152/92, and 143/88. He was noted to be in the process of transition to a  bubble pack by his PCP to encourage medication compliance. Labs showed total cholesterol 117, triglycerides 63, HDL 54, LDL 49 (02/27/2020), fecal occult negative, COVID-19 negative. He reported smoking 1/2 pack/day.  He received the flu vaccine.   He was seen in the ED 05/05/2020 for right ankle sprain/leg injury.  On Monday 12/27, he was involved in an injury at work when a  pallet struck the back of his right ankle in the region of his Achilles tendon.  He was seen at urgent care 12/29 and prescribed a medication.  He was unclear which medication this was when presenting to the [facility] emergency department.  He denied obtaining a x-ray at that time.  With ongoing pain/ecchymosis/and edema of the region, he decided to present to [facility] emergency department 1/2.  He reported he was able to bear weight on it.  Recommendation was for walker boot for suspected ankle sprain versus Achilles injury.  He was prescribed Mobic once daily for his symptoms and swelling.  He was also referred to podiatry for his ankle.  Today, 05/17/2020, he returns to clinic and notes that he is overall doing well from a cardiac standpoint.  At the start of his clinic visit, however, a Band-Aid was provided due to active bleeding from his right ankle injury as outlined above. On visualization of the injury, it appears to be approximately 2 inches and located along his lower posterior calf. Erythema and edema noted around the injury.  He was encouraged to follow with his PCP and any recommended specialist. In terms of cardiac symptoms, he denies any chest pain.  He reports dyspnea with exertion and shortness of breath at rest for unclear amount of time.  Breathing difficulties occur at irregular frequencies and with unclear triggers/exacerbating or alleviating factors.  He notes concern today that his dyspnea may be another blockage. On further questioning, he notes he feels it is similar to before his PCI.  He reports his last episode of dyspnea as occurring yesterday and while at rest. In addition to SOB and DOE, he notes racing heart rate, usually associated with exertion.  No presyncope or syncope.  No recent falls.  He reports medication compliance with review of most recent PCP provided notes.  On review of these notes, it appears that he has transition to bubble pack medication.  He reports that he takes his  bubble pack as directed.  He is uncertain of the medications in his bubble pack.  He reports ongoing smoking cessation, despite PCP report as above, and with congratulations provided.  He is not regularly checking his blood pressure.  He has noted mild lower extremity edema.  He reports pain associated with his injury as outlined directly above.  He reports that he drinks more soda than water and also drinks juice.  He states he knows that he should cut out the soda and juice.  Given medication list disagreement between epic EMR and faxed over PCP notes, list reviewed with patient unclear as to which medications he is taking.  We will need to reach out to his PCP regarding his current medications.  Home Medications    Current Outpatient Medications on File Prior to Visit  Medication Sig Dispense Refill  . Acetaminophen 500 MG capsule SMARTSIG:2 Capsule(s) By Mouth 3 Times Daily PRN    . amLODipine (NORVASC) 10 MG tablet Take 1 tablet (10 mg total) by mouth daily. 30 tablet 11  . aspirin 81 MG EC tablet Take 1 tablet (81 mg total) by  mouth daily. 30 tablet 11  . atorvastatin (LIPITOR) 80 MG tablet Take 1 tablet (80 mg total) by mouth daily. 30 tablet 11  . butalbital-acetaminophen-caffeine (FIORICET) 50-325-40 MG tablet Take 1-2 tablets by mouth every 6 (six) hours as needed for headache. 20 tablet 0  . carvedilol (COREG) 3.125 MG tablet Take 1 tablet (3.125 mg total) by mouth 2 (two) times daily with a meal. 60 tablet 11  . clopidogrel (PLAVIX) 75 MG tablet Take 1 tablet (75 mg total) by mouth daily. 30 tablet 11  . lisinopril (ZESTRIL) 20 MG tablet Take 1 tablet (20 mg total) by mouth daily. 30 tablet 11  . meloxicam (MOBIC) 15 MG tablet Take 1 tablet (15 mg total) by mouth daily for 15 days. 15 tablet 0  . naproxen (NAPROSYN) 500 MG tablet Take 500 mg by mouth 3 (three) times daily.    . nitroGLYCERIN (NITROSTAT) 0.4 MG SL tablet Place 1 tablet (0.4 mg total) under the tongue every 5 (five) minutes  as needed for chest pain. 30 tablet 12   No current facility-administered medications on file prior to visit.    Review of Systems    He denies chest pain, palpitations, pnd, orthopnea, n, v, dizziness, syncope, weight gain, or early satiety. He reports dyspnea without clear exacerbating or alleviating factors and inconsistent. He reports anxiety that his SOB is 2/2 a blockage. He has both shortness of breath and dyspnea. He is uncertain if his DOE began before his SOB at rest. Last episode was yesterday. He reports racing HR, usually with exertion. He reports mild edema. He reports a recent injury with more edema located around his recent right lower extremity injury, which is still healing and Band-Aid provided today given his bleeding.   All other systems reviewed and are otherwise negative except as noted above.  Physical Exam    VS:  BP 120/82 (BP Location: Left Arm, Patient Position: Sitting, Cuff Size: Normal)   Pulse 84   Ht 5\' 3"  (1.6 m)   Wt 184 lb (83.5 kg)   SpO2 98%   BMI 32.59 kg/m  , BMI Body mass index is 32.59 kg/m. GEN: Well nourished, well developed, in no acute distress. HEENT: normal. Neck: Supple, no JVD, carotid bruits, or masses. Cardiac: RRR, no murmurs, rubs, or gallops. No clubbing, cyanosis. Mild bilateral LEE edema. Edema greater around RLE injury.  Radials/DP/PT 2+ and equal bilaterally.  Respiratory:  Respirations regular and unlabored, clear to auscultation bilaterally. GI: Soft, nontender, nondistended, BS + x 4. MS: no deformity or atrophy. Skin: warm and dry, no rash. R calf with 2" scab and bleeding (bandaid provided).  Edema, erythema noted around right lower extremity injury. Neuro:  Strength and sensation are intact. Psych: Normal affect.  Accessory Clinical Findings    ECG personally reviewed by me today - NSR, 65 bpm, PR interval 164 ms, possible LAE, TWI V5-6, nonspecific ST/T changes as seen on prior EKGs- no acute changes.  VITALS Reviewed  today   Temp Readings from Last 3 Encounters:  05/05/20 98.1 F (36.7 C) (Oral)  11/12/19 97.9 F (36.6 C) (Oral)  04/14/19 (!) 97.5 F (36.4 C)   BP Readings from Last 3 Encounters:  05/17/20 120/82  05/05/20 124/81  02/01/20 112/80   Pulse Readings from Last 3 Encounters:  05/17/20 84  05/05/20 85  02/01/20 81    Wt Readings from Last 3 Encounters:  05/17/20 184 lb (83.5 kg)  05/05/20 180 lb (81.6 kg)  02/01/20 184 lb  2 oz (83.5 kg)     LABS  reviewed today   Lab Results  Component Value Date   WBC 5.8 11/12/2019   HGB 13.8 11/12/2019   HCT 37.3 (L) 11/12/2019   MCV 77.9 (L) 11/12/2019   PLT 240 11/12/2019   Lab Results  Component Value Date   CREATININE 1.22 11/12/2019   BUN 11 11/12/2019   NA 138 11/12/2019   K 3.4 (L) 11/12/2019   CL 109 11/12/2019   CO2 23 11/12/2019   Lab Results  Component Value Date   ALT 34 09/05/2019   AST 25 09/05/2019   ALKPHOS 60 09/05/2019   BILITOT 1.1 09/05/2019   Lab Results  Component Value Date   CHOL 215 (H) 09/05/2019   HDL 61 09/05/2019   LDLCALC 131 (H) 09/05/2019   TRIG 117 09/05/2019   CHOLHDL 3.5 09/05/2019    Lab Results  Component Value Date   HGBA1C 5.4 03/18/2019   No results found for: TSH   STUDIES/PROCEDURES reviewed today   PCI 03/20/2019  RPDA lesion is 40% stenosed.  Prox RCA lesion is 30% stenosed.  Previously placed Dist RCA drug eluting stent is widely patent.  Balloon angioplasty was performed.  Mid LAD lesion is 80% stenosed.  Post intervention, there is a 0% residual stenosis.  A drug-eluting stent was successfully placed using a STENT RESOLUTE ONYX 3.0X15. Successful angioplasty and drug-eluting stent placement to the mid LAD. Recommendations: Continue dual antiplatelet therapy for at least 1 year. Aggressive treatment of risk factors. Likely discharge home tomorrow. He is to stay off work for 2 weeks.   Echo 03/18/19 1. Left ventricular ejection fraction, by  visual estimation, is 60 to  65%. The left ventricle has normal function. There is mildly increased  left ventricular hypertrophy.  2. Left ventricular diastolic parameters are consistent with Grade I  diastolic dysfunction (impaired relaxation).  3. Global right ventricle has normal systolic function.The right  ventricular size is normal. No increase in right ventricular wall  thickness.  4. The mitral valve is normal in structure. No evidence of mitral valve  regurgitation. No evidence of mitral stenosis.  5. The tricuspid valve is normal in structure. Tricuspid valve  regurgitation is not demonstrated.  6. The aortic valve is normal in structure. Aortic valve regurgitation is  not visualized. No evidence of aortic valve sclerosis or stenosis.  7. The pulmonic valve was normal in structure. Pulmonic valve  regurgitation is trivial.  8. TR signal is inadequate for assessing pulmonary artery systolic  pressure.  9. The inferior vena cava is normal in size with greater than 50%  respiratory variability, suggesting right atrial pressure of 3 mmHg.   03/18/19 PCI  The left ventricular systolic function is normal.  LV end diastolic pressure is normal.  The left ventricular ejection fraction is 55-65% by visual estimate.  Mid LAD lesion is 80% stenosed.  Prox RCA lesion is 30% stenosed.  Dist RCA lesion is 100% stenosed.  Post intervention, there is a 0% residual stenosis.  A drug-eluting stent was successfully placed using a STENT RESOLUTE ONYX 2.25X22.  RPDA lesion is 40% stenosed. 1.  Significant two-vessel coronary artery disease.  The culprit is an occluded distal right coronary artery with faint left-to-right collaterals.  The supplied area is not large and that is the likely reason for nondiagnostic EKG changes for inferior STEMI.  There is also significant mid LAD stenosis which appears to be hazy. 2.  Normal LV systolic function with Spade-like  appearance of the  left ventricle and mid cavity gradient suggestive of variant form of hypertrophic cardiomyopathy.  Significantly elevated LVEDP in the low 30s. 3.  Successful angioplasty and drug-eluting stent placement to the distal RCA. Recommendations: Continue dual antiplatelet therapy for at least 1 year.  Aggressive treatment of risk factors. Recommend staged PCI of the LAD before hospital discharge.  This is planned for Monday morning.   Assessment & Plan    CAD s/p PCI -- Reports dyspnea and shortness of breath.  PCI/DES to the RCA with staged PCI/DES to the LAD 03/2019.  He has been maintained on DAPT with medication compliance issues in the past and financial barriers noted.  He is currently on blister pack medications and reports that he takes these every day.  He is uncertain of which medications are in his blister packs.  Given his dyspnea, and as he states this sx as similar to before his intervention in 03/2019, recommend further ischemic work-up at this time. Given his risk factors, including known CAD and previous compliance issues with DAPT, as well as h/o tobacco use, cannot completely rule out dyspnea as anginal equivalent.  We will schedule for echo and Lexiscan Myoview with further recommendations following work-up if indicated. Cautioned against taking large amounts of Excedrin due to the caffeine and recommended against Mobic/NSAIDs as outlined below .Continue current medications including ASA 81 mg daily, Plavix, carvedilol, lisinopril, atorvastatin, and as needed nitro.  We discussed the importance of DAPT.  He is uncertain if he is still on Plavix.  Recommended he call the office once home and able to clarify which medications he is taking with pt agreement.    Lower extremity edema -- As noted in the past. Wt stable and otherwise euvolemic on exam.  He notes ongoing lower extremity edema but otherwise denies any other signs or symptoms of volume overload.  We reviewed salt and fluid  restrictions.  Echo 03/2019 with normal LVEF.  We will update echo as above.  Continue to recommend leg elevation and compression for dependent edema.  Reviewed fluid restrictions under 2 L daily and total salt under 2 g daily with patient understanding.  Further recommendations if indicated pending updated echo.  HTN, goal BP 130/80 or lower -- Current BP well controlled.  Encouraged BP checks at home.  PCP BP checks recorded vary with SBP at times elevated into the 150s. Previously supplied with a BP cuff from social work as noted by Gillian Shields, NP.  He does not have a BP log today with recommendation to bring BP check log to RTC.  As noted in his PCP notes, he is intolerant of Imdur due to headaches. Monitor salt and fluid intake, reviewed today in detail. Continue current carvedilol, lisinopril, and amlodipine.  Informed him that Mobic is not recommended, as this can elevate BP and is not ideal from a cardiovascular and renal standpoint.  Also cautioned against taking Excedrin, given the caffeine and this medication.  HLD, LDL goal <70 --LDL at goal. Recent PCP labs reviewed and included cholesterol labs from 02/2020 with total cholesterol 117, triglycerides 63, HDL 54, LDL 49 (02/27/2020). LDL controlled and at goal. Continue current statin. Unable to afford Zetia on review of EMR.   History of tobacco use --Ongoing cessation encouraged.  Recent right lower extremity wound -- Recommend close monitoring per PCP/wound care/podiatry as recommended.  Medication compliance -- As above, he is agreeable to call the office with report of which medications are in his blister  pack.  He reports compliance with his blister pack but is uncertain which medications are available in it.   Medication changes: None.  He is agreeable to call the office once he identifies the medications in his blister pack, given we will want to ensure that he has been taking his Plavix.  He is uncertain if he is taking his  Plavix but he does report he is taking his blister pack. Labs ordered: None.  Reviewed recent PCP labs provided via fax. Studies / Imaging ordered: Echo, Lexiscan Future considerations: Pending results of work-up Disposition: RTC after testing  Total time spent with patient today 45 minutes. This includes reviewing records, evaluating the patient, and coordinating care. Face-to-face time >50%.    Lennon AlstromJacquelyn D River Ambrosio, PA-C 05/17/2020

## 2020-05-17 ENCOUNTER — Ambulatory Visit (INDEPENDENT_AMBULATORY_CARE_PROVIDER_SITE_OTHER): Payer: Medicare Other | Admitting: Physician Assistant

## 2020-05-17 ENCOUNTER — Encounter: Payer: Self-pay | Admitting: Physician Assistant

## 2020-05-17 ENCOUNTER — Other Ambulatory Visit: Payer: Self-pay

## 2020-05-17 ENCOUNTER — Other Ambulatory Visit: Payer: Self-pay | Admitting: Physician Assistant

## 2020-05-17 VITALS — BP 120/82 | HR 84 | Ht 63.0 in | Wt 184.0 lb

## 2020-05-17 DIAGNOSIS — Z79899 Other long term (current) drug therapy: Secondary | ICD-10-CM | POA: Diagnosis not present

## 2020-05-17 DIAGNOSIS — I2510 Atherosclerotic heart disease of native coronary artery without angina pectoris: Secondary | ICD-10-CM

## 2020-05-17 DIAGNOSIS — Z87891 Personal history of nicotine dependence: Secondary | ICD-10-CM | POA: Diagnosis not present

## 2020-05-17 DIAGNOSIS — R600 Localized edema: Secondary | ICD-10-CM

## 2020-05-17 DIAGNOSIS — I252 Old myocardial infarction: Secondary | ICD-10-CM

## 2020-05-17 DIAGNOSIS — Z599 Problem related to housing and economic circumstances, unspecified: Secondary | ICD-10-CM

## 2020-05-17 DIAGNOSIS — R0600 Dyspnea, unspecified: Secondary | ICD-10-CM

## 2020-05-17 DIAGNOSIS — I10 Essential (primary) hypertension: Secondary | ICD-10-CM

## 2020-05-17 DIAGNOSIS — R079 Chest pain, unspecified: Secondary | ICD-10-CM

## 2020-05-17 DIAGNOSIS — R0609 Other forms of dyspnea: Secondary | ICD-10-CM

## 2020-05-17 DIAGNOSIS — E785 Hyperlipidemia, unspecified: Secondary | ICD-10-CM

## 2020-05-17 MED ORDER — CLONIDINE HCL 0.1 MG PO TABS
0.1000 mg | ORAL_TABLET | Freq: Once | ORAL | Status: AC
  Administered 2020-05-17: 0.1 mg via ORAL

## 2020-05-17 NOTE — Patient Instructions (Signed)
Medication Instructions:  Your physician recommends that you continue on your current medications as directed. Please refer to the Current Medication list given to you today.  *If you need a refill on your cardiac medications before your next appointment, please call your pharmacy*   Lab Work: none If you have labs (blood work) drawn today and your tests are completely normal, you will receive your results only by: Marland Kitchen. MyChart Message (if you have MyChart) OR . A paper copy in the mail If you have any lab test that is abnormal or we need to change your treatment, we will call you to review the results.   Testing/Procedures: Your physician has requested that you have an echocardiogram. Echocardiography is a painless test that uses sound waves to create images of your heart. It provides your doctor with information about the size and shape of your heart and how well your heart's chambers and valves are working. This procedure takes approximately one hour. There are no restrictions for this procedure. There is a possibility that an IV may need to be started during your test to inject an image enhancing agent. This is done to obtain more optimal pictures of your heart. Therefore we ask that you do at least drink some water prior to coming in to hydrate your veins.    Your physician has requested that you have a lexiscan myoview. For further information please visit [email or web address]. Please follow instruction sheet, as given. [facility] MYOVIEW  Your caregiver has ordered a Stress Test with nuclear imaging. The purpose of this test is to evaluate the blood supply to your heart muscle. This procedure is referred to as a "Non-Invasive Stress Test." This is because other than having an IV started in your vein, nothing is inserted or "invades" your body. Cardiac stress tests are done to find areas of poor blood flow to the heart by determining the extent of coronary artery disease (CAD). Some patients  exercise on a treadmill, which naturally increases the blood flow to your heart, while others who are  unable to walk on a treadmill due to physical limitations have a pharmacologic/chemical stress agent called Lexiscan . This medicine will mimic walking on a treadmill by temporarily increasing your coronary blood flow.   Please note: these test may take anywhere between 2-4 hours to complete  PLEASE REPORT TO [facility]  THE VOLUNTEERS AT THE FIRST DESK WILL DIRECT YOU WHERE TO GO  Date of Procedure:_____________________________________  Arrival Time for Procedure:______________________________  Instructions regarding medication:   _xx_:  Hold betablocker (Carvedilol) night before procedure and morning of procedure  PLEASE NOTIFY THE OFFICE AT LEAST 24 HOURS IN ADVANCE IF YOU ARE UNABLE TO KEEP YOUR APPOINTMENT.  [phone] AND  PLEASE NOT[facility] AT [facility] AT LEAST 24 HOURS IN ADVANCE IF YOU ARE UNABLE TO KEEP YOUR APPOINTMENT. [phone]  How to prepare for your Myoview test:  1. Do not eat or drink after midnight 2. No caffeine for 24 hours prior to test 3. No smoking 24 hours prior to test. 4. Your medication may be taken with water.  If your doctor stopped a medication because of this test, do not take that medication. 5. Please wear a short sleeve shirt. 6. No perfume, cologne or lotion. 7. Wear comfortable walking shoes.   Follow-Up: At [facility], you and your health needs are our priority.  As part of our continuing mission to provide you with exceptional heart care, we have created designated Provider Care  Teams.  These Care Teams include your primary Cardiologist (physician) and Advanced Practice Providers (APPs -  Physician Assistants and Nurse Practitioners) who all work together to provide you with the care you need, when you need it.  We recommend signing up for the patient portal called "MyChart".  Sign up information is provided on  this After Visit Summary.  MyChart is used to connect with patients for Virtual Visits (Telemedicine).  Patients are able to view lab/test results, encounter notes, upcoming appointments, etc.  Non-urgent messages can be sent to your provider as well.   To learn more about what you can do with MyChart, go to [email or web address].    Your next appointment:   After testing.  The format for your next appointment:   In Person  Provider:   You may see Lorine Bears, MD or one of the following Advanced Practice Providers on your designated Care Team:    Marisue Ivan, [place]    Cardiac Nuclear Scan A cardiac nuclear scan is a test that measures blood flow to the heart when a person is resting and when he or she is exercising. The test looks for problems such as:  Not enough blood reaching a portion of the heart.  The heart muscle not working normally. You may need this test if:  You have heart disease.  You have had abnormal lab results.  You have had heart surgery or a balloon procedure to open up blocked arteries (angioplasty).  You have chest pain.  You have shortness of breath. In this test, a radioactive dye (tracer) is injected into your bloodstream. After the tracer has traveled to your heart, an imaging device is used to measure how much of the tracer is absorbed by or distributed to various areas of your heart. This procedure is usually done at a hospital and takes 2-4 hours. Tell a health care provider about:  Any allergies you have.  All medicines you are taking, including vitamins, herbs, eye drops, creams, and over-the-counter medicines.  Any problems you or family members have had with anesthetic medicines.  Any blood disorders you have.  Any surgeries you have had.  Any medical conditions you have.  Whether you are pregnant or may be pregnant. What are the risks? Generally, this is a safe procedure. However, problems may occur, including:  Serious  chest pain and heart attack. This is only a risk if the stress portion of the test is done.  Rapid heartbeat.  Sensation of warmth in your chest. This usually passes quickly.  Allergic reaction to the tracer. What happens before the procedure?  Ask your health care provider about changing or stopping your regular medicines. This is especially important if you are taking diabetes medicines or blood thinners.  Follow instructions from your health care provider about eating or drinking restrictions.  Remove your jewelry on the day of the procedure. What happens during the procedure?  An IV will be inserted into one of your veins.  Your health care provider will inject a small amount of radioactive tracer through the IV.  You will wait for 20-40 minutes while the tracer travels through your bloodstream.  Your heart activity will be monitored with an electrocardiogram (ECG).  You will lie down on an exam table.  Images of your heart will be taken for about 15-20 minutes.  You may also have a stress test. For this test, one of the following may be done: ? You will exercise on a treadmill or stationary bike.  While you exercise, your heart's activity will be monitored with an ECG, and your blood pressure will be checked. ? You will be given medicines that will increase blood flow to parts of your heart. This is done if you are unable to exercise.  When blood flow to your heart has peaked, a tracer will again be injected through the IV.  After 20-40 minutes, you will get back on the exam table and have more images taken of your heart.  Depending on the type of tracer used, scans may need to be repeated 3-4 hours later.  Your IV line will be removed when the procedure is over. The procedure may vary among health care providers and hospitals. What happens after the procedure?  Unless your health care provider tells you otherwise, you may return to your normal schedule, including diet,  activities, and medicines.  Unless your health care provider tells you otherwise, you may increase your fluid intake. This will help to flush the contrast dye from your body. Drink enough fluid to keep your urine pale yellow.  Ask your health care provider, or the department that is doing the test: ? When will my results be ready? ? How will I get my results? Summary  A cardiac nuclear scan measures the blood flow to the heart when a person is resting and when he or she is exercising.  Tell your health care provider if you are pregnant.  Before the procedure, ask your health care provider about changing or stopping your regular medicines. This is especially important if you are taking diabetes medicines or blood thinners.  After the procedure, unless your health care provider tells you otherwise, increase your fluid intake. This will help flush the contrast dye from your body.  After the procedure, unless your health care provider tells you otherwise, you may return to your normal schedule, including diet, activities, and medicines. This information is not intended to replace advice given to you by your health care provider. Make sure you discuss any questions you have with your health care provider. Document Revised: 10/04/2017 Document Reviewed: 10/04/2017 Elsevier Patient Education  2021 Elsevier Inc.    Echocardiogram An echocardiogram is a test that uses sound waves (ultrasound) to produce images of the heart. Images from an echocardiogram can provide important information about:  Heart size and shape.  The size and thickness and movement of your heart's walls.  Heart muscle function and strength.  Heart valve function or if you have stenosis. Stenosis is when the heart valves are too narrow.  If blood is flowing backward through the heart valves (regurgitation).  A tumor or infectious growth around the heart valves.  Areas of heart muscle that are not working well  because of poor blood flow or injury from a heart attack.  Aneurysm detection. An aneurysm is a weak or damaged part of an artery wall. The wall bulges out from the normal force of blood pumping through the body. Tell a health care provider about:  Any allergies you have.  All medicines you are taking, including vitamins, herbs, eye drops, creams, and over-the-counter medicines.  Any blood disorders you have.  Any surgeries you have had.  Any medical conditions you have.  Whether you are pregnant or may be pregnant. What are the risks? Generally, this is a safe test. However, problems may occur, including an allergic reaction to dye (contrast) that may be used during the test. What happens before the test? No specific preparation is needed. You  may eat and drink normally. What happens during the test?  You will take off your clothes from the waist up and put on a hospital gown.  Electrodes or electrocardiogram (ECG)patches may be placed on your chest. The electrodes or patches are then connected to a device that monitors your heart rate and rhythm.  You will lie down on a table for an ultrasound exam. A gel will be applied to your chest to help sound waves pass through your skin.  A handheld device, called a transducer, will be pressed against your chest and moved over your heart. The transducer produces sound waves that travel to your heart and bounce back (or "echo" back) to the transducer. These sound waves will be captured in real-time and changed into images of your heart that can be viewed on a video monitor. The images will be recorded on a computer and reviewed by your health care provider.  You may be asked to change positions or hold your breath for a short time. This makes it easier to get different views or better views of your heart.  In some cases, you may receive contrast through an IV in one of your veins. This can improve the quality of the pictures from your  heart. The procedure may vary among health care providers and hospitals.   What can I expect after the test? You may return to your normal, everyday life, including diet, activities, and medicines, unless your health care provider tells you not to do that. Follow these instructions at home:  It is up to you to get the results of your test. Ask your health care provider, or the department that is doing the test, when your results will be ready.  Keep all follow-up visits. This is important. Summary  An echocardiogram is a test that uses sound waves (ultrasound) to produce images of the heart.  Images from an echocardiogram can provide important information about the size and shape of your heart, heart muscle function, heart valve function, and other possible heart problems.  You do not need to do anything to prepare before this test. You may eat and drink normally.  After the echocardiogram is completed, you may return to your normal, everyday life, unless your health care provider tells you not to do that. This information is not intended to replace advice given to you by your health care provider. Make sure you discuss any questions you have with your health care provider. Document Revised: 12/12/2019 Document Reviewed: 12/12/2019 Elsevier Patient Education  2021 [employer].

## 2020-06-05 ENCOUNTER — Ambulatory Visit: Payer: Medicare Other

## 2020-06-07 ENCOUNTER — Other Ambulatory Visit: Payer: Medicare Other

## 2020-06-10 ENCOUNTER — Encounter: Payer: Medicare Other | Attending: Physician Assistant | Admitting: Physician Assistant

## 2020-06-10 ENCOUNTER — Other Ambulatory Visit: Payer: Self-pay

## 2020-06-10 DIAGNOSIS — L97812 Non-pressure chronic ulcer of other part of right lower leg with fat layer exposed: Secondary | ICD-10-CM | POA: Insufficient documentation

## 2020-06-10 DIAGNOSIS — Z7901 Long term (current) use of anticoagulants: Secondary | ICD-10-CM | POA: Diagnosis not present

## 2020-06-10 NOTE — Progress Notes (Signed)
Jeff Rios, Jeff Rios ([id number]) Visit Report for 06/10/2020 Abuse/Suicide Risk Screen Details Patient Name: Jeff Rios, Jeff Rios Date of Service: 06/10/2020 8:30 AM Medical Record Number: [id number] Patient Account Number: [id number] Date of Birth/Sex: Sep 29, 1965 (55 y.o. Male) Treating RN: Rogers Blocker Primary Care Mikell Kazlauskas: PATIENT, NO Other Clinician: Referring Shawntell Dixson: Cam Hai Treating Mendi Constable/Extender: Rowan Blase in Treatment: 0 Abuse/Suicide Risk Screen Items Answer ABUSE RISK SCREEN: Has anyone close to you tried to hurt or harm you recentlyo No Do you feel uncomfortable with anyone in your familyo No Has anyone forced you do things that you didnot want to doo No Electronic Signature(s) Signed: 06/10/2020 10:53:21 AM By: Phillis Haggis, Dondra Prader RN Entered By: Phillis Haggis, Kenia on 06/10/2020 08:47:19 Jeff Rios ([id number]) -------------------------------------------------------------------------------- Activities of Daily Living Details Patient Name: Jeff Rios Date of Service: 06/10/2020 8:30 AM Medical Record Number: [id number] Patient Account Number: [id number] Date of Birth/Sex: 12-03-1965 (55 y.o. Male) Treating RN: Rogers Blocker Primary Care Jazma Pickel: PATIENT, NO Other Clinician: Referring Elowen Debruyn: Cam Hai Treating Naina Sleeper/Extender: Rowan Blase in Treatment: 0 Activities of Daily Living Items Answer Activities of Daily Living (Please select one for each item) Drive Automobile Not Able Take Medications Completely Able Use Telephone Completely Able Care for Appearance Completely Able Use Toilet Completely Able Bath / Shower Completely Able Dress Self Completely Able Feed Self Completely Able Walk Completely Able Get In / Out Bed Completely Able Housework Completely Able Prepare Meals Completely Able Handle Money Completely Able Shop for Self Completely Able Electronic Signature(s) Signed: 06/10/2020 10:53:21 AM By: Phillis Haggis, Dondra Prader RN Entered By: Phillis Haggis, Dondra Prader on 06/10/2020 08:47:46 Jeff Rios ([id number]) -------------------------------------------------------------------------------- Education Screening Details Patient Name: Jeff Rios Date of Service: 06/10/2020 8:30 AM Medical Record Number: [id number] Patient Account Number: [id number] Date of Birth/Sex: 11-22-1965 (54 y.o. Male) Treating RN: Rogers Blocker Primary Care Lindsi Bayliss: PATIENT, NO Other Clinician: Referring Lalah Durango: Cam Hai Treating Rapheal Masso/Extender: Rowan Blase in Treatment: 0 Primary Learner Assessed: Patient Learning Preferences/Education Level/Primary Language Learning Preference: Explanation, Demonstration Highest Education Level: High School Preferred Language: English Cognitive Barrier Language Barrier: No Translator Needed: No Memory Deficit: No Emotional Barrier: No Cultural/Religious Beliefs Affecting Medical Care: No Physical Barrier Impaired Vision: No Impaired Hearing: No Decreased Hand dexterity: No Knowledge/Comprehension Knowledge Level: Medium Comprehension Level: Medium Ability to understand written instructions: Medium Ability to understand verbal instructions: Medium Motivation Anxiety Level: Calm Cooperation: Cooperative Education Importance: Acknowledges Need Interest in Health Problems: Asks Questions Perception: Coherent Willingness to Engage in Self-Management High Activities: Readiness to Engage in Self-Management High Activities: Electronic Signature(s) Signed: 06/10/2020 10:53:21 AM By: Phillis Haggis, Dondra Prader RN Entered By: Phillis Haggis, Dondra Prader on 06/10/2020 08:48:09 Jeff Rios ([id number]) -------------------------------------------------------------------------------- Fall Risk Assessment Details Patient Name: Jeff Rios Date of Service: 06/10/2020 8:30 AM Medical Record Number: [id number] Patient Account Number: [id number] Date of  Birth/Sex: [birth year] (55 y.o. Male) Treating RN: Rogers Blocker Primary Care Clarity Ciszek: PATIENT, NO Other Clinician: Referring Donna Silverman: Cam Hai Treating Tariya Morrissette/Extender: Rowan Blase in Treatment: 0 Fall Risk Assessment Items Have you had 2 or more falls in the last 12 monthso 0 No Have you had any fall that resulted in injury in the last 12 monthso 0 No FALLS RISK SCREEN History of falling - immediate or within 3 months 0 No Secondary diagnosis (Do you have 2 or more medical diagnoseso) 0 No Ambulatory aid None/bed rest/wheelchair/nurse 0 Yes Crutches/cane/walker 0 No Furniture 0 No Intravenous therapy Access/Saline/Heparin Lock 0 No Gait/Transferring Normal/ bed rest/ wheelchair 0 Yes Weak (short steps with or without  shuffle, stooped but able to lift head while walking, may 0 No seek support from furniture) Impaired (short steps with shuffle, may have difficulty arising from chair, head down, impaired 0 No balance) Mental Status Oriented to own ability 0 Yes Electronic Signature(s) Signed: 06/10/2020 10:53:21 AM By: Phillis Haggis, Dondra Prader RN Entered By: Phillis Haggis, Dondra Prader on 06/10/2020 08:48:23 Jeff Rios ([id number]) -------------------------------------------------------------------------------- Foot Assessment Details Patient Name: Jeff Rios Date of Service: 06/10/2020 8:30 AM Medical Record Number: [id number] Patient Account Number: [id number] Date of Birth/Sex: [birth year] (55 y.o. Male) Treating RN: Rogers Blocker Primary Care Angell Pincock: PATIENT, NO Other Clinician: Referring Brentt Fread: Cam Hai Treating Maxfield Gildersleeve/Extender: Rowan Blase in Treatment: 0 Foot Assessment Items Site Locations + = Sensation present, - = Sensation absent, C = Callus, U = Ulcer R = Redness, W = Warmth, M = Maceration, PU = Pre-ulcerative lesion F = Fissure, S = Swelling, D = Dryness Assessment Right: Left: Other Deformity: No No Prior Foot Ulcer: No  No Prior Amputation: No No Charcot Joint: No No Ambulatory Status: Ambulatory Without Help Gait: Steady Electronic Signature(s) Signed: 06/10/2020 10:53:21 AM By: Phillis Haggis, Dondra Prader RN Entered By: Phillis Haggis, Dondra Prader on 06/10/2020 08:52:27 Jeff Rios ([id number]) -------------------------------------------------------------------------------- Nutrition Risk Screening Details Patient Name: Jeff Rios Date of Service: 06/10/2020 8:30 AM Medical Record Number: [id number] Patient Account Number: [id number] Date of Birth/Sex: 02-Nov-1965 (55 y.o. Male) Treating RN: Rogers Blocker Primary Care Javaun Dimperio: PATIENT, NO Other Clinician: Referring Kymberli Wiegand: Cam Hai Treating Dalyah Pla/Extender: Rowan Blase in Treatment: 0 Height (in): 63 Weight (lbs): 182 Body Mass Index (BMI): 32.2 Nutrition Risk Screening Items Score Screening NUTRITION RISK SCREEN: I have an illness or condition that made me change the kind and/or amount of food I eat 0 No I eat fewer than two meals per day 0 No I eat few fruits and vegetables, or milk products 0 No I have three or more drinks of beer, liquor or wine almost every day 0 No I have tooth or mouth problems that make it hard for me to eat 0 No I don't always have enough money to buy the food I need 0 No I eat alone most of the time 0 No I take three or more different prescribed or over-the-counter drugs a day 1 Yes Without wanting to, I have lost or gained 10 pounds in the last six months 0 No I am not always physically able to shop, cook and/or feed myself 0 No Nutrition Protocols Good Risk Protocol 0 No interventions needed Moderate Risk Protocol High Risk Proctocol Risk Level: Good Risk Score: 1 Electronic Signature(s) Signed: 06/10/2020 10:53:21 AM By: Phillis Haggis, Dondra Prader RN Entered By: Phillis Haggis, Dondra Prader on 06/10/2020 08:48:47

## 2020-06-12 NOTE — Progress Notes (Signed)
EJ, PINSON ([id number]) Visit Report for 06/10/2020 Chief Complaint Document Details Patient Name: Jeff Rios, Jeff Rios Date of Service: 06/10/2020 8:30 AM Medical Record Number: [id number] Patient Account Number: [id number] Date of Birth/Sex: 16-Sep-1965 (55 y.o. Male) Treating RN: Yevonne Pax Primary Care Provider: PATIENT, NO Other Clinician: Referring Provider: Cam Hai Treating Provider/Extender: Rowan Blase in Treatment: 0 Information Obtained from: Patient Chief Complaint Right LE Electronic Signature(s) Signed: 06/10/2020 9:12:11 AM By: Lenda Kelp PA-C Entered By: Lenda Kelp on 06/10/2020 09:12:10 Jeff Rios ([id number]) -------------------------------------------------------------------------------- HPI Details Patient Name: Jeff Rios Date of Service: 06/10/2020 8:30 AM Medical Record Number: [id number] Patient Account Number: [id number] Date of Birth/Sex: 04-15-1966 (55 y.o. Male) Treating RN: Yevonne Pax Primary Care Provider: PATIENT, NO Other Clinician: Referring Provider: Cam Hai Treating Provider/Extender: Rowan Blase in Treatment: 0 History of Present Illness HPI Description: 06/10/2020 upon evaluation today patient presents for initial evaluation here in our clinic concerning issue with a wound which began on 29 December. Subsequently the patient tells me that this is a work comp related injury that he was working at [employer] when one of the bundles of cardboard boxes that they compile unfortunately fell off of a what sounds to be dolly struck him in the back of the leg. He initially had a small laceration. Unfortunately this developed into a much bigger issue as noted today where there is necrotic tissue in the base of the wound as well. There is a lot of swelling around the edges of the wound as well. It obviously seems like this turned into a hematoma which is not farfetched considering the fact that the patient is on  long-term anticoagulant therapy secondary to his cardiac history. He does have a history of myocardial infarction even at such a young age. He is on Eliquis I do believe at this point. Subsequently the patient states that he does have some discomfort here is not excruciating but nonetheless it is problematic for him. Electronic Signature(s) Signed: 06/10/2020 4:42:27 PM By: Lenda Kelp PA-C Entered By: Lenda Kelp on 06/10/2020 16:42:27 Jeff Rios ([id number]) -------------------------------------------------------------------------------- Physical Exam Details Patient Name: Jeff Rios Date of Service: 06/10/2020 8:30 AM Medical Record Number: [id number] Patient Account Number: [id number] Date of Birth/Sex: Dec 31, 1965 (55 y.o. Male) Treating RN: Yevonne Pax Primary Care Provider: PATIENT, NO Other Clinician: Referring Provider: Cam Hai Treating Provider/Extender: Rowan Blase in Treatment: 0 Constitutional sitting or standing blood pressure is within target range for patient.. pulse regular and within target range for patient.Marland Kitchen respirations regular, non- labored and within target range for patient.Marland Kitchen temperature within target range for patient.. Well-nourished and well-hydrated in no acute distress. Eyes conjunctiva clear no eyelid edema noted. pupils equal round and reactive to light and accommodation. Ears, Nose, Mouth, and Throat no gross abnormality of ear auricles or external auditory canals. normal hearing noted during conversation. mucus membranes moist. Respiratory normal breathing without difficulty. Cardiovascular 2+ dorsalis pedis/posterior tibialis pulses. 1+ pitting edema of the bilateral lower extremities. Musculoskeletal normal gait and posture. no significant deformity or arthritic changes, no loss or range of motion, no clubbing. Psychiatric this patient is able to make decisions and demonstrates good insight into disease process. Alert and  Oriented x 3. pleasant and cooperative. Notes Upon inspection patient's wound bed actually showed signs of necrotic tissue in the base of the wound this did require some debridement at this point. With that being said I am concerned about the fact that unfortunately I do not believe he is going  to tolerate significant debridement and in fact he was already starting to bleed a bit being on the blood thinner I think that skin to be an issue here for [place] as well. For that reason I discussed with the patient that I do believe we need to go ahead and use a medication to help debride away the wound also think we need to control his edema. For that reason we will get a go ahead and initiate treatment with [facility] which I think will be a good option for him at this time. Electronic Signature(s) Signed: 06/10/2020 4:45:04 PM By: Lenda Kelp PA-C Previous Signature: 06/10/2020 4:43:32 PM Version By: Lenda Kelp PA-C Entered By: Lenda Kelp on 06/10/2020 16:45:04 Jeff Rios ([id number]) -------------------------------------------------------------------------------- Physician Orders Details Patient Name: Jeff Rios Date of Service: 06/10/2020 8:30 AM Medical Record Number: [id number] Patient Account Number: [id number] Date of Birth/Sex: 1965-12-15 (55 y.o. Male) Treating RN: Yevonne Pax Primary Care Provider: PATIENT, NO Other Clinician: Referring Provider: Cam Hai Treating Provider/Extender: Rowan Blase in Treatment: 0 Verbal / Phone Orders: No Diagnosis Coding ICD-10 Coding Code Description S81.801A Unspecified open wound, right lower leg, initial encounter L97.812 Non-pressure chronic ulcer of other part of right lower leg with fat layer exposed Z79.01 Long term (current) use of anticoagulants I25.10 Atherosclerotic heart disease of native coronary artery without angina pectoris Follow-up Appointments o Return Appointment in 1 week. - Monday o Other:  - Nurse visit on Thursday Bathing/ Shower/ Hygiene o May shower with wound dressing protected with water repellent cover or cast protector. Edema Control - Lymphedema / Segmental Compressive Device / Other o Elevate, Exercise Daily and Avoid Standing for Long Periods of Time. o Elevate legs to the level of the heart and pump ankles as often as possible o Elevate leg(s) parallel to the floor when sitting. Wound Treatment Wound #1 - Lower Leg Wound Laterality: Right, Posterior Cleanser: Normal Saline (Generic) 2 x Per Week/30 Days Discharge Instructions: Wash your hands with soap and water. Remove old dressing, discard into plastic bag and place into trash. Cleanse the wound with Normal Saline prior to applying a clean dressing using gauze sponges, not tissues or cotton balls. Do not scrub or use excessive force. Pat dry using gauze sponges, not tissue or cotton balls. Cleanser: Soap and Water 2 x Per Week/30 Days Discharge Instructions: Gently cleanse wound with antibacterial soap, rinse and pat dry prior to dressing wounds Primary Dressing: Hydrofera Blue Ready Transfer Foam, 2.5x2.5 (in/in) (Generic) 2 x Per Week/30 Days Discharge Instructions: Apply Hydrofera Blue Ready to wound bed as directed Secondary Dressing: ABD Pad 5x9 (in/in) (Generic) 2 x Per Week/30 Days Discharge Instructions: Cover with ABD pad Compression Wrap: Profore Lite LF 3 Multilayer Compression Bandaging System (Generic) 2 x Per Week/30 Days Discharge Instructions: Apply 3 multi-layer wrap as prescribed. Compression Wrap: Unna w/Calamine, 4x10 (in/yd) (Generic) 2 x Per Week/30 Days Discharge Instructions: Apply unna paste -3 finger-widths below knee to anchor compression wrap in place. Electronic Signature(s) Signed: 06/10/2020 4:46:05 PM By: Lenda Kelp PA-C Signed: 06/12/2020 9:44:48 AM By: Yevonne Pax RN Entered By: Yevonne Pax on 06/10/2020 09:19:10 Jeff Rios  ([id number]) -------------------------------------------------------------------------------- Problem List Details Patient Name: Jeff Rios Date of Service: 06/10/2020 8:30 AM Medical Record Number: [id number] Patient Account Number: [id number] Date of Birth/Sex: 1965-10-03 (55 y.o. Male) Treating RN: Yevonne Pax Primary Care Provider: PATIENT, NO Other Clinician: Referring Provider: Cam Hai Treating Provider/Extender: Rowan Blase in Treatment: 0 Active Problems ICD-10 Encounter Code  Description Active Date MDM Diagnosis S81.801A Unspecified open wound, right lower leg, initial encounter 06/10/2020 No Yes L97.812 Non-pressure chronic ulcer of other part of right lower leg with fat layer 06/10/2020 No Yes exposed Z79.01 Long term (current) use of anticoagulants 06/10/2020 No Yes I25.10 Atherosclerotic heart disease of native coronary artery without angina 06/10/2020 No Yes pectoris Inactive Problems Resolved Problems Electronic Signature(s) Signed: 06/10/2020 9:11:53 AM By: Lenda Kelp PA-C Entered By: Lenda Kelp on 06/10/2020 09:11:53 Jeff Rios ([id number]) -------------------------------------------------------------------------------- Progress Note Details Patient Name: Jeff Rios Date of Service: 06/10/2020 8:30 AM Medical Record Number: [id number] Patient Account Number: [id number] Date of Birth/Sex: [birth year] (55 y.o. Male) Treating RN: Yevonne Pax Primary Care Provider: PATIENT, NO Other Clinician: Referring Provider: Cam Hai Treating Provider/Extender: Rowan Blase in Treatment: 0 Subjective Chief Complaint Information obtained from Patient Right LE History of Present Illness (HPI) 06/10/2020 upon evaluation today patient presents for initial evaluation here in our clinic concerning issue with a wound which began on 29 December. Subsequently the patient tells me that this is a work comp related injury that he was working at [employer] when one of the bundles of cardboard boxes that they compile unfortunately fell off of a what sounds to be dolly struck him in the back of the leg. He initially had a small laceration. Unfortunately this developed into a much bigger issue as noted today where there is necrotic tissue in the base of the wound as well. There is a lot of swelling around the edges of the wound as well. It obviously seems like this turned into a hematoma which is not farfetched considering the fact that the patient is on long-term anticoagulant therapy secondary to his cardiac history. He does have a history of myocardial infarction even at such a young age. He is on Eliquis I do believe at this point. Subsequently the patient states that he does have some discomfort here is not excruciating but nonetheless it is problematic for him. Patient History Information obtained from Patient. Allergies No Known Allergies Social History Former smoker, Marital Status - Single, Alcohol Use - Never, Drug Use - No History, Caffeine Use - Rarely. Medical History Eyes Denies history of Cataracts, Glaucoma, Optic Neuritis Ear/Nose/Mouth/Throat Denies history of Chronic sinus problems/congestion, Middle ear problems Hematologic/Lymphatic Denies history of Anemia, Hemophilia, Human Immunodeficiency Virus, Lymphedema, Sickle Cell Disease Respiratory Denies history of Aspiration, Asthma, Chronic Obstructive Pulmonary Disease (COPD), Pneumothorax, Sleep Apnea, Tuberculosis Cardiovascular Patient has history of Coronary Artery Disease, Hypertension, Myocardial Infarction Denies history of Angina, Arrhythmia, Congestive Heart Failure, Deep Vein Thrombosis, Hypotension, Peripheral Arterial Disease, Peripheral Venous Disease, Phlebitis, Vasculitis Gastrointestinal Denies history of Cirrhosis , Colitis, Crohn s, Hepatitis A, Hepatitis B, Hepatitis C Endocrine Denies history of Type I Diabetes, Type II  Diabetes Genitourinary Denies history of End Stage Renal Disease Immunological Denies history of Lupus Erythematosus, Raynaud s, Scleroderma Integumentary (Skin) Denies history of History of Burn, History of pressure wounds Musculoskeletal Denies history of Gout, Rheumatoid Arthritis, Osteoarthritis, Osteomyelitis Neurologic Denies history of Dementia, Neuropathy, Quadriplegia, Paraplegia, Seizure Disorder Oncologic Denies history of Received Chemotherapy, Received Radiation Psychiatric Denies history of Anorexia/bulimia, Confinement Anxiety Review of Systems (ROS) Eyes Denies complaints or symptoms of Dry Eyes, Vision Changes, Glasses / Contacts. Ear/Nose/Mouth/Throat Denies complaints or symptoms of Difficult clearing ears, Sinusitis. Hematologic/Lymphatic Denies complaints or symptoms of Bleeding / Clotting Disorders, Human Immunodeficiency Virus. Respiratory TREYLON, HENARD ([id number]) Denies complaints or symptoms of Chronic or frequent coughs, Shortness of Breath. Cardiovascular Denies complaints  or symptoms of Chest pain, LE edema. Gastrointestinal Denies complaints or symptoms of Frequent diarrhea, Nausea, Vomiting. Endocrine Denies complaints or symptoms of Hepatitis, Thyroid disease, Polydypsia (Excessive Thirst). Genitourinary Denies complaints or symptoms of Kidney failure/ Dialysis, Incontinence/dribbling. Immunological Denies complaints or symptoms of Hives, Itching. Integumentary (Skin) Complains or has symptoms of Wounds. Denies complaints or symptoms of Bleeding or bruising tendency, Breakdown, Swelling. Musculoskeletal Denies complaints or symptoms of Muscle Pain, Muscle Weakness. Neurologic Denies complaints or symptoms of Numbness/parasthesias, Focal/Weakness. Psychiatric Denies complaints or symptoms of Anxiety, Claustrophobia. Objective Constitutional sitting or standing blood pressure is within target range for patient.. pulse regular and within  target range for patient.Marland Kitchen respirations regular, non- labored and within target range for patient.Marland Kitchen temperature within target range for patient.. Well-nourished and well-hydrated in no acute distress. Vitals Time Taken: 8:38 AM, Height: 63 in, Source: Stated, Weight: 182 lbs, Source: Measured, BMI: 32.2, Temperature: 98.1 F, Pulse: 67 bpm, Respiratory Rate: 16 breaths/min, Blood Pressure: 125/82 mmHg. Eyes conjunctiva clear no eyelid edema noted. pupils equal round and reactive to light and accommodation. Ears, Nose, Mouth, and Throat no gross abnormality of ear auricles or external auditory canals. normal hearing noted during conversation. mucus membranes moist. Respiratory normal breathing without difficulty. Cardiovascular 2+ dorsalis pedis/posterior tibialis pulses. 1+ pitting edema of the bilateral lower extremities. Musculoskeletal normal gait and posture. no significant deformity or arthritic changes, no loss or range of motion, no clubbing. Psychiatric this patient is able to make decisions and demonstrates good insight into disease process. Alert and Oriented x 3. pleasant and cooperative. General Notes: Upon inspection patient's wound bed actually showed signs of necrotic tissue in the base of the wound this did require some debridement at this point. With that being said I am concerned about the fact that unfortunately I do not believe he is going to tolerate significant debridement and in fact he was already starting to bleed a bit being on the blood thinner I think that skin to be an issue here for [place] as well. For that reason I discussed with the patient that I do believe we need to go ahead and use a medication to help debride away the wound also think we need to control his edema. For that reason we will get a go ahead and initiate treatment with [facility] which I think will be a good option for him at this time. Integumentary (Hair, Skin) Wound #1 status is Open.  Original cause of wound was Trauma. The wound is located on the Right,Posterior Lower Leg. The wound measures 4.4cm length x 2.2cm width x 0.7cm depth; 7.603cm^2 area and 5.322cm^3 volume. There is Fat Layer (Subcutaneous Tissue) exposed. There is no tunneling or undermining noted. There is a medium amount of serosanguineous drainage noted. The wound margin is flat and intact. There is small (1-33%) red, hyper - granulation within the wound bed. There is a large (67-100%) amount of necrotic tissue within the wound bed including Adherent Slough. Assessment Jeff Rios, Jeff Rios ([id number]) Active Problems ICD-10 Unspecified open wound, right lower leg, initial encounter Non-pressure chronic ulcer of other part of right lower leg with fat layer exposed Long term (current) use of anticoagulants Atherosclerotic heart disease of native coronary artery without angina pectoris Plan Follow-up Appointments: Return Appointment in 1 week. - Monday Other: - Nurse visit on Thursday Bathing/ Shower/ Hygiene: May shower with wound dressing protected with water repellent cover or cast protector. Edema Control - Lymphedema / Segmental Compressive Device / Other: Elevate, Exercise Daily and Avoid  Standing for Long Periods of Time. Elevate legs to the level of the heart and pump ankles as often as possible Elevate leg(s) parallel to the floor when sitting. WOUND #1: - Lower Leg Wound Laterality: Right, Posterior Cleanser: Normal Saline (Generic) 2 x Per Week/30 Days Discharge Instructions: Wash your hands with soap and water. Remove old dressing, discard into plastic bag and place into trash. Cleanse the wound with Normal Saline prior to applying a clean dressing using gauze sponges, not tissues or cotton balls. Do not scrub or use excessive force. Pat dry using gauze sponges, not tissue or cotton balls. Cleanser: Soap and Water 2 x Per Week/30 Days Discharge Instructions: Gently cleanse wound with  antibacterial soap, rinse and pat dry prior to dressing wounds Primary Dressing: Hydrofera Blue Ready Transfer Foam, 2.5x2.5 (in/in) (Generic) 2 x Per Week/30 Days Discharge Instructions: Apply Hydrofera Blue Ready to wound bed as directed Secondary Dressing: ABD Pad 5x9 (in/in) (Generic) 2 x Per Week/30 Days Discharge Instructions: Cover with ABD pad Compression Wrap: Profore Lite LF 3 Multilayer Compression Bandaging System (Generic) 2 x Per Week/30 Days Discharge Instructions: Apply 3 multi-layer wrap as prescribed. Compression Wrap: Unna w/Calamine, 4x10 (in/yd) (Generic) 2 x Per Week/30 Days Discharge Instructions: Apply unna paste -3 finger-widths below knee to anchor compression wrap in place. 1. Would recommend currently that we go ahead and initiate treatment with [facility] I think this is can work well as a debriding dressing and hopefully should help to clean out the wound. 2. I am also can recommend at this point that we go ahead and use a 3 layer compression wrap to help with edema control. 3. I would recommend as well he needs to continue to elevate his legs much as possible try to keep edema under good control. We will see patient back for reevaluation in 1 week here in the clinic. If anything worsens or changes patient will contact our office for additional recommendations. For the time being I am getting keep him out of work we week by week judge how he is doing and I will try to get back to work as soon as possible but I do not want to rush this. Electronic Signature(s) Signed: 06/10/2020 4:45:12 PM By: Lenda Kelp PA-C Previous Signature: 06/10/2020 4:44:47 PM Version By: Lenda Kelp PA-C Entered By: Lenda Kelp on 06/10/2020 16:45:12 Jeff Rios ([id number]) -------------------------------------------------------------------------------- ROS/PFSH Details Patient Name: Jeff Rios Date of Service: 06/10/2020 8:30 AM Medical Record Number:  [id number] Patient Account Number: [id number] Date of Birth/Sex: 1965/05/11 (54 y.o. Male) Treating RN: Rogers Blocker Primary Care Provider: PATIENT, NO Other Clinician: Referring Provider: Cam Hai Treating Provider/Extender: Rowan Blase in Treatment: 0 Information Obtained From Patient Eyes Complaints and Symptoms: Negative for: Dry Eyes; Vision Changes; Glasses / Contacts Medical History: Negative for: Cataracts; Glaucoma; Optic Neuritis Ear/Nose/Mouth/Throat Complaints and Symptoms: Negative for: Difficult clearing ears; Sinusitis Medical History: Negative for: Chronic sinus problems/congestion; Middle ear problems Hematologic/Lymphatic Complaints and Symptoms: Negative for: Bleeding / Clotting Disorders; Human Immunodeficiency Virus Medical History: Negative for: Anemia; Hemophilia; Human Immunodeficiency Virus; Lymphedema; Sickle Cell Disease Respiratory Complaints and Symptoms: Negative for: Chronic or frequent coughs; Shortness of Breath Medical History: Negative for: Aspiration; Asthma; Chronic Obstructive Pulmonary Disease (COPD); Pneumothorax; Sleep Apnea; Tuberculosis Cardiovascular Complaints and Symptoms: Negative for: Chest pain; LE edema Medical History: Positive for: Coronary Artery Disease; Hypertension; Myocardial Infarction Negative for: Angina; Arrhythmia; Congestive Heart Failure; Deep Vein Thrombosis; Hypotension; Peripheral Arterial Disease; Peripheral Venous Disease; Phlebitis; Vasculitis  Gastrointestinal Complaints and Symptoms: Negative for: Frequent diarrhea; Nausea; Vomiting Medical History: Negative for: Cirrhosis ; Colitis; Crohnos; Hepatitis A; Hepatitis B; Hepatitis C Endocrine Complaints and Symptoms: Negative for: Hepatitis; Thyroid disease; Polydypsia (Excessive Thirst) Medical History: Negative for: Type I Diabetes; Type II Diabetes Jeff Rios, Jeff Rios ([id number]) Genitourinary Complaints and Symptoms: Negative for: Kidney  failure/ Dialysis; Incontinence/dribbling Medical History: Negative for: End Stage Renal Disease Immunological Complaints and Symptoms: Negative for: Hives; Itching Medical History: Negative for: Lupus Erythematosus; Raynaudos; Scleroderma Integumentary (Skin) Complaints and Symptoms: Positive for: Wounds Negative for: Bleeding or bruising tendency; Breakdown; Swelling Medical History: Negative for: History of Burn; History of pressure wounds Musculoskeletal Complaints and Symptoms: Negative for: Muscle Pain; Muscle Weakness Medical History: Negative for: Gout; Rheumatoid Arthritis; Osteoarthritis; Osteomyelitis Neurologic Complaints and Symptoms: Negative for: Numbness/parasthesias; Focal/Weakness Medical History: Negative for: Dementia; Neuropathy; Quadriplegia; Paraplegia; Seizure Disorder Psychiatric Complaints and Symptoms: Negative for: Anxiety; Claustrophobia Medical History: Negative for: Anorexia/bulimia; Confinement Anxiety Oncologic Medical History: Negative for: Received Chemotherapy; Received Radiation Immunizations Pneumococcal Vaccine: Received Pneumococcal Vaccination: No Implantable Devices None Family and Social History Former smoker; Marital Status - Single; Alcohol Use: Never; Drug Use: No History; Caffeine Use: Rarely Electronic Signature(s) Signed: 06/10/2020 10:53:21 AM By: Phillis HaggisSanchez Pereyda, Dondra PraderKenia RN Signed: 06/10/2020 4:46:05 PM By: Lenda KelpStone III, Mikaela Hilgeman PA-C Entered By: Phillis HaggisSanchez Pereyda, Dondra PraderKenia on 06/10/2020 08:47:12 Jeff Rios, Corrin ([id number]) Jeff Rios, Kinneth ([id number]) -------------------------------------------------------------------------------- SuperBill Details Patient Name: Jeff Rios, Finas Date of Service: 06/10/2020 Medical Record Number: [id number] Patient Account Number: [id number] Date of Birth/Sex: Feb 20, 1966 51(54 y.o. Male) Treating RN: Yevonne PaxEpps, Carrie Primary Care Provider: PATIENT, NO Other Clinician: Referring Provider: Cam HaiARR,  JACOB Treating Provider/Extender: Rowan BlaseStone, Madine Sarr Weeks in Treatment: 0 Diagnosis Coding ICD-10 Codes Code Description S81.801A Unspecified open wound, right lower leg, initial encounter L97.812 Non-pressure chronic ulcer of other part of right lower leg with fat layer exposed Z79.01 Long term (current) use of anticoagulants I25.10 Atherosclerotic heart disease of native coronary artery without angina pectoris Facility Procedures CPT4 Code: [id number] Description: 99214 - WOUND CARE VISIT-LEV 4 EST PT Modifier: Quantity: 1 Physician Procedures CPT4 Code: [id number] Description: WC PHYS LEVEL 3 o NEW PT Modifier: Quantity: 1 CPT4 Code: Description: ICD-10 Diagnosis Description S81.801A Unspecified open wound, right lower leg, initial encounter L97.812 Non-pressure chronic ulcer of other part of right lower leg with fat Z79.01 Long term (current) use of anticoagulants I25.10  Atherosclerotic heart disease of native coronary artery without angin Modifier: layer exposed a pectoris Quantity: Electronic Signature(s) Signed: 06/10/2020 4:45:28 PM By: Lenda KelpStone III, Kingstin Heims PA-C Entered By: Lenda KelpStone III, Yvon Mccord on 06/10/2020 16:45:28

## 2020-06-12 NOTE — Progress Notes (Signed)
EQUAN, COGBILL ([id number]) Visit Report for 06/10/2020 Allergy List Details Patient Name: Jeff Rios, Jeff Rios Date of Service: 06/10/2020 8:30 AM Medical Record Number: [id number] Patient Account Number: [id number] Date of Birth/Sex: Feb 04, 1966 (55 y.o. Male) Treating RN: Rogers Blocker Primary Care Krissy Orebaugh: PATIENT, NO Other Clinician: Referring Ellyanna Holton: Cam Hai Treating Terrace Chiem/Extender: Rowan Blase in Treatment: 0 Allergies Active Allergies No Known Allergies Type: Allergen Allergy Notes Electronic Signature(s) Signed: 06/10/2020 10:53:21 AM By: Phillis Haggis, Dondra Prader RN Entered By: Phillis Haggis, Dondra Prader on 06/10/2020 08:41:20 Jeff Rios ([id number]) -------------------------------------------------------------------------------- Arrival Information Details Patient Name: Jeff Rios Date of Service: 06/10/2020 8:30 AM Medical Record Number: [id number] Patient Account Number: [id number] Date of Birth/Sex: [birth year] (54 y.o. Male) Treating RN: Rogers Blocker Primary Care Mattew Chriswell: PATIENT, NO Other Clinician: Referring Arilla Hice: Cam Hai Treating Dagoberto Nealy/Extender: Rowan Blase in Treatment: 0 Visit Information Patient Arrived: Ambulatory Arrival Time: 08:38 Accompanied By: case manager Transfer Assistance: None Patient Identification Verified: Yes Secondary Verification Process Completed: Yes Patient Has Alerts: Yes Patient Alerts: Patient on Blood Thinner PLAVIX Electronic Signature(s) Signed: 06/10/2020 10:53:21 AM By: Phillis Haggis, Dondra Prader RN Entered By: Phillis Haggis, Dondra Prader on 06/10/2020 08:43:03 Jeff Rios ([id number]) -------------------------------------------------------------------------------- Clinic Level of Care Assessment Details Patient Name: Jeff Rios Date of Service: 06/10/2020 8:30 AM Medical Record Number: [id number] Patient Account Number: [id number] Date of Birth/Sex: Feb 01, 1966 (55 y.o. Male) Treating  RN: Yevonne Pax Primary Care Tanise Russman: PATIENT, NO Other Clinician: Referring Nolie Bignell: Cam Hai Treating Stefannie Defeo/Extender: Rowan Blase in Treatment: 0 Clinic Level of Care Assessment Items TOOL 2 Quantity Score X - Use when only an EandM is performed on the INITIAL visit 1 0 ASSESSMENTS - Nursing Assessment / Reassessment X - General Physical Exam (combine w/ comprehensive assessment (listed just below) when performed on new 1 20 pt. evals) X- 1 25 Comprehensive Assessment (HX, ROS, Risk Assessments, Wounds Hx, etc.) ASSESSMENTS - Wound and Skin Assessment / Reassessment X - Simple Wound Assessment / Reassessment - one wound 1 5 []  - 0 Complex Wound Assessment / Reassessment - multiple wounds []  - 0 Dermatologic / Skin Assessment (not related to wound area) ASSESSMENTS - Ostomy and/or Continence Assessment and Care []  - Incontinence Assessment and Management 0 []  - 0 Ostomy Care Assessment and Management (repouching, etc.) PROCESS - Coordination of Care X - Simple Patient / Family Education for ongoing care 1 15 []  - 0 Complex (extensive) Patient / Family Education for ongoing care X- 1 10 Staff obtains , Records, Test Results / Process Orders []  - 0 Staff telephones HHA, Nursing Homes / Clarify orders / etc []  - 0 Routine Transfer to another Facility (non-emergent condition) []  - 0 Routine Hospital Admission (non-emergent condition) []  - 0 New Admissions / / Ordering NPWT, Apligraf, etc. []  - 0 Emergency Hospital Admission (emergent condition) X- 1 10 Simple Discharge Coordination []  - 0 Complex (extensive) Discharge Coordination PROCESS - Special Needs []  - Pediatric / Minor Patient Management 0 []  - 0 Isolation Patient Management []  - 0 Hearing / Language / Visual special needs []  - 0 Assessment of Community assistance (transportation, D/C planning, etc.) []  - 0 Additional assistance / Altered mentation []  -  0 Support Surface(s) Assessment (bed, cushion, seat, etc.) INTERVENTIONS - Wound Cleansing / Measurement X - Wound Imaging (photographs - any number of wounds) 1 5 []  - 0 Wound Tracing (instead of photographs) X- 1 5 Simple Wound Measurement - one wound []  - 0 Complex Wound Measurement - multiple wounds Weinrich, Shmiel ( ) X- 1 5  Simple Wound Cleansing - one wound []  - 0 Complex Wound Cleansing - multiple wounds INTERVENTIONS - Wound Dressings []  - Small Wound Dressing one or multiple wounds 0 X- 1 15 Medium Wound Dressing one or multiple wounds []  - 0 Large Wound Dressing one or multiple wounds []  - 0 Application of Medications - injection INTERVENTIONS - Miscellaneous []  - External ear exam 0 []  - 0 Specimen Collection (cultures, biopsies, blood, body fluids, etc.) []  - 0 Specimen(s) / Culture(s) sent or taken to Lab for analysis []  - 0 Patient Transfer (multiple staff / Nurse, adult / Similar devices) []  - 0 Simple Staple / Suture removal (25 or less) []  - 0 Complex Staple / Suture removal (26 or more) []  - 0 Hypo / Hyperglycemic Management (close monitor of Blood Glucose) X- 1 15 Ankle / Brachial Index (ABI) - do not check if billed separately Has the patient been seen at the hospital within the last three years: Yes Total Score: 130 Level Of Care: New/Established - Level 4 Electronic Signature(s) Signed: 06/12/2020 9:44:48 AM By: Yevonne Pax RN Entered By: Yevonne Pax on 06/10/2020 09:19:44 Jeff Rios ([id number]) -------------------------------------------------------------------------------- Lower Extremity Assessment Details Patient Name: Jeff Rios Date of Service: 06/10/2020 8:30 AM Medical Record Number: [id number] Patient Account Number: [id number] Date of Birth/Sex: 12/02/1965 (55 y.o. Male) Treating RN: Rogers Blocker Primary Care Mcihael Hinderman: PATIENT, NO Other Clinician: Referring Zaiyden Strozier: Cam Hai Treating Natoria Archibald/Extender:  Rowan Blase in Treatment: 0 Edema Assessment Assessed: [Left: Yes] [Right: Yes] Edema: [Left: No] [Right: Yes] Calf Left: Right: Point of Measurement: 30 cm From Medial Instep 33 cm 33.6 cm Ankle Left: Right: Point of Measurement: 10 cm From Medial Instep 22 cm 25 cm Knee To Floor Left: Right: From Medial Instep 45 cm Vascular Assessment Pulses: Dorsalis Pedis Palpable: [Left:Yes] [Right:Yes] Doppler Audible: [Right:Yes] Posterior Tibial Palpable: [Left:Yes] [Right:Yes] Doppler Audible: [Right:Yes] Blood Pressure: Brachial: [Right:112] Dorsalis Pedis: 134 Ankle: Posterior Tibial: 130 Ankle Brachial Index: [Right:1.20] Electronic Signature(s) Signed: 06/10/2020 10:53:21 AM By: Phillis Haggis, Dondra Prader RN Entered By: Phillis Haggis, Dondra Prader on 06/10/2020 09:07:13 Jeff Rios ([id number]) -------------------------------------------------------------------------------- Multi Wound Chart Details Patient Name: Jeff Rios Date of Service: 06/10/2020 8:30 AM Medical Record Number: [id number] Patient Account Number: [id number] Date of Birth/Sex: August 30, 1965 (55 y.o. Male) Treating RN: Yevonne Pax Primary Care Laelynn Blizzard: PATIENT, NO Other Clinician: Referring Vickee Mormino: Cam Hai Treating Janiel Derhammer/Extender: Rowan Blase in Treatment: 0 Vital Signs Height(in): 63 Pulse(bpm): 67 Weight(lbs): 182 Blood Pressure(mmHg): 125/82 Body Mass Index(BMI): 32 Temperature(F): 98.1 Respiratory Rate(breaths/min): 16 Photos: [N/A:N/A] Wound Location: Right, Posterior Lower Leg N/A N/A Wounding Event: Trauma N/A N/A Primary Etiology: Trauma, Other N/A N/A Comorbid History: Coronary Artery Disease, N/A N/A Hypertension, Myocardial Infarction Date Acquired: 05/01/2020 N/A N/A Weeks of Treatment: 0 N/A N/A Wound Status: Open N/A N/A Measurements L x W x D (cm) 4.4x2.2x0.7 N/A N/A Area (cm) : 7.603 N/A N/A Volume (cm) : 5.322 N/A N/A % Reduction in Area: 0.00% N/A  N/A % Reduction in Volume: 0.00% N/A N/A Classification: Full Thickness Without Exposed N/A N/A Support Structures Exudate Amount: Medium N/A N/A Exudate Type: Serosanguineous N/A N/A Exudate Color: red, brown N/A N/A Wound Margin: Flat and Intact N/A N/A Granulation Amount: Small (1-33%) N/A N/A Granulation Quality: Red, Hyper-granulation N/A N/A Necrotic Amount: Large (67-100%) N/A N/A Exposed Structures: Fat Layer (Subcutaneous Tissue): N/A N/A Yes Fascia: No Tendon: No Muscle: No Joint: No Bone: No Epithelialization: None N/A N/A Treatment Notes Electronic Signature(s) Signed: 06/12/2020 9:44:48 AM By: Yevonne Pax RN  Entered By: Yevonne Pax on 06/10/2020 09:15:07 Jeff Rios ([id number]) -------------------------------------------------------------------------------- Multi-Disciplinary Care Plan Details Patient Name: Jeff Rios Date of Service: 06/10/2020 8:30 AM Medical Record Number: [id number] Patient Account Number: [id number] Date of Birth/Sex: January 22, 1966 (55 y.o. Male) Treating RN: Yevonne Pax Primary Care Terrence Pizana: PATIENT, NO Other Clinician: Referring Latondra Gebhart: Cam Hai Treating Burleigh Brockmann/Extender: Rowan Blase in Treatment: 0 Active Inactive Wound/Skin Impairment Nursing Diagnoses: Knowledge deficit related to ulceration/compromised skin integrity Goals: Patient/caregiver will verbalize understanding of skin care regimen Date Initiated: 06/10/2020 Target Resolution Date: 07/08/2020 Goal Status: Active Ulcer/skin breakdown will have a volume reduction of 30% by week 4 Date Initiated: 06/10/2020 Target Resolution Date: 07/08/2020 Goal Status: Active Ulcer/skin breakdown will have a volume reduction of 50% by week 8 Date Initiated: 06/10/2020 Target Resolution Date: 08/08/2020 Goal Status: Active Ulcer/skin breakdown will have a volume reduction of 80% by week 12 Date Initiated: 06/10/2020 Target Resolution Date: 09/07/2020 Goal Status:  Active Ulcer/skin breakdown will heal within 14 weeks Date Initiated: 06/10/2020 Target Resolution Date: 10/08/2020 Goal Status: Active Interventions: Assess patient/caregiver ability to obtain necessary supplies Assess patient/caregiver ability to perform ulcer/skin care regimen upon admission and as needed Assess ulceration(s) every visit Notes: Electronic Signature(s) Signed: 06/12/2020 9:44:48 AM By: Yevonne Pax RN Entered By: Yevonne Pax on 06/10/2020 09:14:48 Jeff Rios ([id number]) -------------------------------------------------------------------------------- Pain Assessment Details Patient Name: Jeff Rios Date of Service: 06/10/2020 8:30 AM Medical Record Number: [id number] Patient Account Number: [id number] Date of Birth/Sex: 1965/11/02 (55 y.o. Male) Treating RN: Rogers Blocker Primary Care [place] Lasseter: PATIENT, NO Other Clinician: Referring Dalexa Gentz: Cam Hai Treating Zyaire Dumas/Extender: Rowan Blase in Treatment: 0 Active Problems Location of Pain Severity and Description of Pain Patient Has Paino No Site Locations Rate the pain. Current Pain Level: 0 Pain Management and Medication Current Pain Management: Electronic Signature(s) Signed: 06/10/2020 10:53:21 AM By: Phillis Haggis, Dondra Prader RN Entered By: Phillis Haggis, Dondra Prader on 06/10/2020 08:38:37 Jeff Rios ([id number]) -------------------------------------------------------------------------------- Patient/Caregiver Education Details Patient Name: Jeff Rios Date of Service: 06/10/2020 8:30 AM Medical Record Number: [id number] Patient Account Number: [id number] Date of Birth/Gender: 08-Mar-1966 (55 y.o. Male) Treating RN: Yevonne Pax Primary Care Physician: PATIENT, NO Other Clinician: Referring Physician: Cam Hai Treating Physician/Extender: Rowan Blase in Treatment: 0 Education Assessment Education Provided To: Patient Education Topics Provided Wound/Skin  Impairment: Methods: Explain/Verbal Responses: State content correctly Electronic Signature(s) Signed: 06/12/2020 9:44:48 AM By: Yevonne Pax RN Entered By: Yevonne Pax on 06/10/2020 09:20:30 Jeff Rios ([id number]) -------------------------------------------------------------------------------- Wound Assessment Details Patient Name: Jeff Rios Date of Service: 06/10/2020 8:30 AM Medical Record Number: [id number] Patient Account Number: [id number] Date of Birth/Sex: 07-Feb-1966 (55 y.o. Male) Treating RN: Rogers Blocker Primary Care Fawaz Borquez: PATIENT, NO Other Clinician: Referring Taletha Twiford: Cam Hai Treating Chemeka Filice/Extender: Rowan Blase in Treatment: 0 Wound Status Wound Number: 1 Primary Trauma, Other Etiology: Wound Location: Right, Posterior Lower Leg Wound Status: Open Wounding Event: Trauma Comorbid Coronary Artery Disease, Hypertension, Myocardial Date Acquired: 05/01/2020 History: Infarction Weeks Of Treatment: 0 Clustered Wound: No Photos Wound Measurements Length: (cm) 4.4 Width: (cm) 2.2 Depth: (cm) 0.7 Area: (cm) 7.603 Volume: (cm) 5.322 % Reduction in Area: 0% % Reduction in Volume: 0% Epithelialization: None Tunneling: No Undermining: No Wound Description Classification: Full Thickness Without Exposed Support Structu Wound Margin: Flat and Intact Exudate Amount: Medium Exudate Type: Serosanguineous Exudate Color: red, brown res Foul Odor After Cleansing: No Slough/Fibrino Yes Wound Bed Granulation Amount: Small (1-33%) Exposed Structure Granulation Quality: Red, Hyper-granulation Fascia Exposed: No Necrotic Amount: Large (67-100%) Fat Layer (Subcutaneous Tissue) Exposed:  Yes Necrotic Quality: Adherent Slough Tendon Exposed: No Muscle Exposed: No Joint Exposed: No Bone Exposed: No Electronic Signature(s) Signed: 06/10/2020 10:53:21 AM By: Phillis Haggis, Dondra Prader RN Entered By: Phillis Haggis, Kenia on 06/10/2020  08:57:36 Jeff Rios ([id number]) -------------------------------------------------------------------------------- Vitals Details Patient Name: Jeff Rios Date of Service: 06/10/2020 8:30 AM Medical Record Number: [id number] Patient Account Number: [id number] Date of Birth/Sex: 1965-09-25 (55 y.o. Male) Treating RN: Rogers Blocker Primary Care Kijuana Ruppel: PATIENT, NO Other Clinician: Referring Lorna Strother: Cam Hai Treating Jeff Mccallum/Extender: Rowan Blase in Treatment: 0 Vital Signs Time Taken: 08:38 Temperature (F): 98.1 Height (in): 63 Pulse (bpm): 67 Source: Stated Respiratory Rate (breaths/min): 16 Weight (lbs): 182 Blood Pressure (mmHg): 125/82 Source: Measured Reference Range: 80 - 120 mg / dl Body Mass Index (BMI): 32.2 Electronic Signature(s) Signed: 06/10/2020 10:53:21 AM By: Phillis Haggis, Dondra Prader RN Entered By: Phillis Haggis, Dondra Prader on 06/10/2020 08:40:51

## 2020-06-13 ENCOUNTER — Other Ambulatory Visit: Payer: Self-pay

## 2020-06-13 DIAGNOSIS — L97812 Non-pressure chronic ulcer of other part of right lower leg with fat layer exposed: Secondary | ICD-10-CM | POA: Diagnosis not present

## 2020-06-13 NOTE — Progress Notes (Signed)
Jeff Rios ([id number]) Visit Report for 06/13/2020 Arrival Information Details Patient Name: Jeff Rios Date of Service: 06/13/2020 9:15 AM Medical Record Number: [id number] Patient Account Number: [id number] Date of Birth/Sex: Sep 18, 1965 (55 y.o. M) Treating RN: Yevonne Pax Primary Care Prem Coykendall: PATIENT, NO Other Clinician: Referring Michalla Ringer: Cam Hai Treating Dreon Pineda/Extender: Rowan Blase in Treatment: 0 Visit Information History Since Last Visit All ordered tests and consults were completed: No Patient Arrived: Ambulatory Added or deleted any medications: No Arrival Time: 09:28 Any new allergies or adverse reactions: No Accompanied By: Darrick Huntsman comp Had a fall or experienced change in No Transfer Assistance: None activities of daily living that may affect Patient Identification Verified: Yes risk of falls: Secondary Verification Process Completed: Yes Signs or symptoms of abuse/neglect since last visito No Patient Has Alerts: Yes Hospitalized since last visit: No Patient Alerts: Patient on Blood Thinner Implantable device outside of the clinic excluding No PLAVIX cellular tissue based products placed in the center since last visit: Has Dressing in Place as Prescribed: Yes Has Compression in Place as Prescribed: Yes Pain Present Now: No Electronic Signature(s) Signed: 06/13/2020 5:17:48 PM By: Yevonne Pax RN Entered By: Yevonne Pax on 06/13/2020 09:49:21 Jeff Rios ([id number]) -------------------------------------------------------------------------------- Clinic Level of Care Assessment Details Patient Name: Jeff Rios Date of Service: 06/13/2020 9:15 AM Medical Record Number: [id number] Patient Account Number: [id number] Date of Birth/Sex: [birth year] (55 y.o. M) Treating RN: Yevonne Pax Primary Care Sherryll Skoczylas: PATIENT, NO Other Clinician: Referring Artice Bergerson: Cam Hai Treating Kalla Watson/Extender: Rowan Blase in  Treatment: 0 Clinic Level of Care Assessment Items TOOL 1 Quantity Score []  - Use when EandM and Procedure is performed on INITIAL visit 0 ASSESSMENTS - Nursing Assessment / Reassessment []  - General Physical Exam (combine w/ comprehensive assessment (listed just below) when performed on new 0 pt. evals) []  - 0 Comprehensive Assessment (HX, ROS, Risk Assessments, Wounds Hx, etc.) ASSESSMENTS - Wound and Skin Assessment / Reassessment []  - Dermatologic / Skin Assessment (not related to wound area) 0 ASSESSMENTS - Ostomy and/or Continence Assessment and Care []  - Incontinence Assessment and Management 0 []  - 0 Ostomy Care Assessment and Management (repouching, etc.) PROCESS - Coordination of Care []  - Simple Patient / Family Education for ongoing care 0 []  - 0 Complex (extensive) Patient / Family Education for ongoing care []  - 0 Staff obtains , Records, Test Results / Process Orders []  - 0 Staff telephones HHA, Nursing Homes / Clarify orders / etc []  - 0 Routine Transfer to another Facility (non-emergent condition) []  - 0 Routine Hospital Admission (non-emergent condition) []  - 0 New Admissions / / Ordering NPWT, Apligraf, etc. []  - 0 Emergency Hospital Admission (emergent condition) PROCESS - Special Needs []  - Pediatric / Minor Patient Management 0 []  - 0 Isolation Patient Management []  - 0 Hearing / Language / Visual special needs []  - 0 Assessment of Community assistance (transportation, D/C planning, etc.) []  - 0 Additional assistance / Altered mentation []  - 0 Support Surface(s) Assessment (bed, cushion, seat, etc.) INTERVENTIONS - Miscellaneous []  - External ear exam 0 []  - 0 Patient Transfer (multiple staff / / Similar devices) []  - 0 Simple Staple / Suture removal (25 or less) []  - 0 Complex Staple / Suture removal (26 or more) []  - 0 Hypo/Hyperglycemic Management (do not check if billed separately) []  -  0 Ankle / Brachial Index (ABI) - do not check if billed separately Has the patient been seen at the hospital within the last three  years: Yes Total Score: 0 Level Of Care: ____ Jeff Rios ([id number]) Electronic Signature(s) Signed: 06/13/2020 5:17:48 PM By: Yevonne Pax RN Entered By: Yevonne Pax on 06/13/2020 09:50:28 Jeff Rios ([id number]) -------------------------------------------------------------------------------- Compression Therapy Details Patient Name: Jeff Rios Date of Service: 06/13/2020 9:15 AM Medical Record Number: [id number] Patient Account Number: [id number] Date of Birth/Sex: 05-28-1965 (55 y.o. M) Treating RN: Yevonne Pax Primary Care Janey Petron: PATIENT, NO Other Clinician: Referring Lucretia Pendley: Cam Hai Treating Amy Gothard/Extender: Rowan Blase in Treatment: 0 Compression Therapy Performed for Wound Assessment: Wound #1 Right,Posterior Lower Leg Performed By: Clinician Yevonne Pax, RN Compression Type: Three Layer Electronic Signature(s) Signed: 06/13/2020 5:17:48 PM By: Yevonne Pax RN Entered By: Yevonne Pax on 06/13/2020 09:49:51 Jeff Rios ([id number]) -------------------------------------------------------------------------------- Encounter Discharge Information Details Patient Name: Jeff Rios Date of Service: 06/13/2020 9:15 AM Medical Record Number: [id number] Patient Account Number: [id number] Date of Birth/Sex: 1965/09/09 (55 y.o. M) Treating RN: Yevonne Pax Primary Care Ka Bench: PATIENT, NO Other Clinician: Referring Haniel Fix: Cam Hai Treating Kenlynn Houde/Extender: Rowan Blase in Treatment: 0 Encounter Discharge Information Items Discharge Condition: Stable Ambulatory Status: Ambulatory Discharge Destination: Home Transportation: Private Auto Accompanied By: Venetia Constable Schedule Follow-up Appointment: Yes Clinical Summary of Care: Patient Declined Electronic Signature(s) Signed: 06/13/2020  5:17:48 PM By: Yevonne Pax RN Entered By: Yevonne Pax on 06/13/2020 09:50:20 Jeff Rios ([id number]) -------------------------------------------------------------------------------- Wound Assessment Details Patient Name: Jeff Rios Date of Service: 06/13/2020 9:15 AM Medical Record Number: [id number] Patient Account Number: [id number] Date of Birth/Sex: Jan 27, 1966 (55 y.o. M) Treating RN: Yevonne Pax Primary Care Meeah Totino: PATIENT, NO Other Clinician: Referring Sarika Baldini: Cam Hai Treating Treylen Gibbs/Extender: Rowan Blase in Treatment: 0 Wound Status Wound Number: 1 Primary Trauma, Other Etiology: Wound Location: Right, Posterior Lower Leg Wound Status: Open Wounding Event: Trauma Comorbid Coronary Artery Disease, Hypertension, Myocardial Date Acquired: 05/01/2020 History: Infarction Weeks Of Treatment: 0 Clustered Wound: No Wound Measurements Length: (cm) 4.4 Width: (cm) 2.2 Depth: (cm) 0.7 Area: (cm) 7.603 Volume: (cm) 5.322 % Reduction in Area: 0% % Reduction in Volume: 0% Epithelialization: None Wound Description Classification: Full Thickness Without Exposed Support Structu Wound Margin: Flat and Intact Exudate Amount: Medium Exudate Type: Serosanguineous Exudate Color: red, brown res Foul Odor After Cleansing: No Slough/Fibrino Yes Wound Bed Granulation Amount: Small (1-33%) Exposed Structure Granulation Quality: Red, Hyper-granulation Fascia Exposed: No Necrotic Amount: Large (67-100%) Fat Layer (Subcutaneous Tissue) Exposed: Yes Necrotic Quality: Adherent Slough Tendon Exposed: No Muscle Exposed: No Joint Exposed: No Bone Exposed: No Treatment Notes Wound #1 (Lower Leg) Wound Laterality: Right, Posterior Cleanser Normal Saline Discharge Instruction: Wash your hands with soap and water. Remove old dressing, discard into plastic bag and place into trash. Cleanse the wound with Normal Saline prior to applying a clean dressing using  gauze sponges, not tissues or cotton balls. Do not scrub or use excessive force. Pat dry using gauze sponges, not tissue or cotton balls. Soap and Water Discharge Instruction: Gently cleanse wound with antibacterial soap, rinse and pat dry prior to dressing wounds Peri-Wound Care Topical Primary Dressing Hydrofera Blue Ready Transfer Foam, 2.5x2.5 (in/in) Discharge Instruction: Apply Hydrofera Blue Ready to wound bed as directed Secondary Dressing ABD Pad 5x9 (in/in) Discharge Instruction: Cover with ABD pad EVERARD, INTERRANTE ([id number]) Secured With Compression Wrap Profore Lite LF 3 Multilayer Compression Bandaging System Discharge Instruction: Apply 3 multi-layer wrap as prescribed. Unna w/Calamine, 4x10 (in/yd) Discharge Instruction: Apply unna paste -3 finger-widths below knee to anchor compression wrap in place. Compression Stockings Add-Ons Electronic Signature(s) Signed: 06/13/2020 5:17:48 PM By: Jettie Pagan,  Lyla Son RN Entered By: Yevonne Pax on 06/13/2020 09:49:35

## 2020-06-17 ENCOUNTER — Other Ambulatory Visit: Payer: Self-pay

## 2020-06-17 ENCOUNTER — Encounter: Payer: Medicare Other | Admitting: Physician Assistant

## 2020-06-17 DIAGNOSIS — L97812 Non-pressure chronic ulcer of other part of right lower leg with fat layer exposed: Secondary | ICD-10-CM | POA: Diagnosis not present

## 2020-06-17 NOTE — Progress Notes (Addendum)
Jeff Rios, Wasil ([id number]) Visit Report for 06/17/2020 Chief Complaint Document Details Patient Name: Jeff Rios, Jeff Rios Date of Service: 06/17/2020 9:15 AM Medical Record Number: [id number] Patient Account Number: [id number] Date of Birth/Sex: [birth year] (55 y.o. M) Treating RN: Yevonne PaxEpps, Carrie Primary Care Provider: PATIENT, NO Other Clinician: Referring Provider: Cam Haiarr, Jacob Treating Provider/Extender: Rowan BlaseStone, Krishawna Stiefel Weeks in Treatment: 1 Information Obtained from: Patient Chief Complaint Right LE Electronic Signature(s) Signed: 06/17/2020 9:38:41 AM By: Lenda KelpStone III, Donnielle Addison PA-C Entered By: Lenda KelpStone III, Jayleene Glaeser on 06/17/2020 09:38:41 Jeff Rios, Regina ([id number]) -------------------------------------------------------------------------------- HPI Details Patient Name: Jeff Rios, Jeff Rios Date of Service: 06/17/2020 9:15 AM Medical Record Number: [id number] Patient Account Number: [id number] Date of Birth/Sex: [birth year] (54 y.o. M) Treating RN: Yevonne PaxEpps, Carrie Primary Care Provider: PATIENT, NO Other Clinician: Referring Provider: Cam Haiarr, Jacob Treating Provider/Extender: Rowan BlaseStone, Verlie Hellenbrand Weeks in Treatment: 1 History of Present Illness HPI Description: 06/10/2020 upon evaluation today patient presents for initial evaluation here in our clinic concerning issue with a wound which began on 29 December. Subsequently the patient tells me that this is a work comp related injury that he was working at [employer] when one of the bundles of cardboard boxes that they compile unfortunately fell off of a what sounds to be dolly struck him in the back of the leg. He initially had a small laceration. Unfortunately this developed into a much bigger issue as noted today where there is necrotic tissue in the base of the wound as well. There is a lot of swelling around the edges of the wound as well. It obviously seems like this turned into a hematoma which is not farfetched considering the fact that the patient is on  long-term anticoagulant therapy secondary to his cardiac history. He does have a history of myocardial infarction even at such a young age. He is on Eliquis I do believe at this point. Subsequently the patient states that he does have some discomfort here is not excruciating but nonetheless it is problematic for him. 06/17/2020 upon evaluation today patient appears to be doing better in regard to his wound on the posterior aspect of his right leg. He has been tolerating the dressing changes without complication even in 1 week's time I do see a significant improvement here which is great news. There does not appear to be any evidence of active infection at this point which is excellent. Overall I am extremely pleased with how things seem to be progressing. Electronic Signature(s) Signed: 06/17/2020 2:40:07 PM By: Lenda KelpStone III, Deaun Rocha PA-C Entered By: Lenda KelpStone III, Zamere Pasternak on 06/17/2020 14:40:07 Jeff Rios, Jeff Rios ([id number]) -------------------------------------------------------------------------------- Physical Exam Details Patient Name: Jeff Rios, Jeff Rios Date of Service: 06/17/2020 9:15 AM Medical Record Number: [id number] Patient Account Number: [id number] Date of Birth/Sex: [birth year] (54 y.o. M) Treating RN: Yevonne PaxEpps, Carrie Primary Care Provider: PATIENT, NO Other Clinician: Referring Provider: Cam Haiarr, Jacob Treating Provider/Extender: Rowan BlaseStone, Lakela Kuba Weeks in Treatment: 1 Constitutional Well-nourished and well-hydrated in no acute distress. Respiratory normal breathing without difficulty. Psychiatric this patient is able to make decisions and demonstrates good insight into disease process. Alert and Oriented x 3. pleasant and cooperative. Notes Upon inspection patient's wound bed actually showed signs of good granulation and epithelization at this point. There does not appear to be any evidence of active infection which is great news and overall I am extremely pleased with where things stand. No fevers,  chills, nausea, vomiting, or diarrhea. Electronic Signature(s) Signed: 06/17/2020 2:47:23 PM By: Lenda KelpStone III, Kaydon Husby PA-C Entered By: Lenda KelpStone III, Cathleen Yagi on 06/17/2020 14:47:22 Jeff Rios, Jeff Rios ([id number]) -------------------------------------------------------------------------------- Physician Orders Details  Patient Name: [name], Jeff Rios Date of Service: 06/17/2020 9:15 AM Medical Record Number: [id number] Patient Account Number: [id number] Date of Birth/Sex: February 22, 1966 (55 y.o. M) Treating RN: Yevonne Pax Primary Care Provider: PATIENT, NO Other Clinician: Referring Provider: Cam Hai Treating Provider/Extender: Rowan Blase in Treatment: 1 Verbal / Phone Orders: No Diagnosis Coding ICD-10 Coding Code Description S81.801A Unspecified open wound, right lower leg, initial encounter L97.812 Non-pressure chronic ulcer of other part of right lower leg with fat layer exposed Z79.01 Long term (current) use of anticoagulants I25.10 Atherosclerotic heart disease of native coronary artery without angina pectoris Follow-up Appointments o Return Appointment in 1 week. - Monday o Other: - Nurse visit on Thursday Bathing/ Shower/ Hygiene o May shower with wound dressing protected with water repellent cover or cast protector. Edema Control - Lymphedema / Segmental Compressive Device / Other o Elevate, Exercise Daily and Avoid Standing for Long Periods of Time. o Elevate legs to the level of the heart and pump ankles as often as possible o Elevate leg(s) parallel to the floor when sitting. Wound Treatment Wound #1 - Lower Leg Wound Laterality: Right, Posterior Cleanser: Normal Saline (Generic) 2 x Per Week/30 Days Discharge Instructions: Wash your hands with soap and water. Remove old dressing, discard into plastic bag and place into trash. Cleanse the wound with Normal Saline prior to applying a clean dressing using gauze sponges, not tissues or cotton balls. Do not scrub or  use excessive force. Pat dry using gauze sponges, not tissue or cotton balls. Cleanser: Soap and Water 2 x Per Week/30 Days Discharge Instructions: Gently cleanse wound with antibacterial soap, rinse and pat dry prior to dressing wounds Primary Dressing: Hydrofera Blue Ready Transfer Foam, 2.5x2.5 (in/in) (Generic) 2 x Per Week/30 Days Discharge Instructions: Apply Hydrofera Blue Ready to wound bed as directed Secondary Dressing: ABD Pad 5x9 (in/in) (Generic) 2 x Per Week/30 Days Discharge Instructions: Cover with ABD pad Compression Wrap: Profore Lite LF 3 Multilayer Compression Bandaging System (Generic) 2 x Per Week/30 Days Discharge Instructions: Apply 3 multi-layer wrap as prescribed. Compression Wrap: Unna w/Calamine, 4x10 (in/yd) (Generic) 2 x Per Week/30 Days Discharge Instructions: Apply unna paste -3 finger-widths below knee to anchor compression wrap in place. Electronic Signature(s) Signed: 06/17/2020 4:08:54 PM By: Yevonne Pax RN Signed: 06/18/2020 9:55:27 AM By: Lenda Kelp PA-C Entered By: Yevonne Pax on 06/17/2020 10:14:06 Jeff Rios ([id number]) -------------------------------------------------------------------------------- Problem List Details Patient Name: Jeff Rios Date of Service: 06/17/2020 9:15 AM Medical Record Number: [id number] Patient Account Number: [id number] Date of Birth/Sex: [birth year] (55 y.o. M) Treating RN: Yevonne Pax Primary Care Provider: PATIENT, NO Other Clinician: Referring Provider: Cam Hai Treating Provider/Extender: Rowan Blase in Treatment: 1 Active Problems ICD-10 Encounter Code Description Active Date MDM Diagnosis S81.801A Unspecified open wound, right lower leg, initial encounter 06/10/2020 No Yes L97.812 Non-pressure chronic ulcer of other part of right lower leg with fat layer 06/10/2020 No Yes exposed Z79.01 Long term (current) use of anticoagulants 06/10/2020 No Yes I25.10 Atherosclerotic heart disease of  native coronary artery without angina 06/10/2020 No Yes pectoris Inactive Problems Resolved Problems Electronic Signature(s) Signed: 06/17/2020 9:38:34 AM By: Lenda Kelp PA-C Entered By: Lenda Kelp on 06/17/2020 09:38:34 Jeff Rios ([id number]) -------------------------------------------------------------------------------- Progress Note Details Patient Name: Jeff Rios Date of Service: 06/17/2020 9:15 AM Medical Record Number: [id number] Patient Account Number: [id number] Date of Birth/Sex: Oct 23, 1965 (54 y.o. M) Treating RN: Yevonne Pax Primary Care Provider: PATIENT, NO Other Clinician: Referring Provider: Cam Hai Treating Provider/Extender: Allen Derry  Weeks in Treatment: 1 Subjective Chief Complaint Information obtained from Patient Right LE History of Present Illness (HPI) 06/10/2020 upon evaluation today patient presents for initial evaluation here in our clinic concerning issue with a wound which began on 29 December. Subsequently the patient tells me that this is a work comp related injury that he was working at [employer] when one of the bundles of cardboard boxes that they compile unfortunately fell off of a what sounds to be dolly struck him in the back of the leg. He initially had a small laceration. Unfortunately this developed into a much bigger issue as noted today where there is necrotic tissue in the base of the wound as well. There is a lot of swelling around the edges of the wound as well. It obviously seems like this turned into a hematoma which is not farfetched considering the fact that the patient is on long-term anticoagulant therapy secondary to his cardiac history. He does have a history of myocardial infarction even at such a young age. He is on Eliquis I do believe at this point. Subsequently the patient states that he does have some discomfort here is not excruciating but nonetheless it is problematic for him. 06/17/2020 upon  evaluation today patient appears to be doing better in regard to his wound on the posterior aspect of his right leg. He has been tolerating the dressing changes without complication even in 1 week's time I do see a significant improvement here which is great news. There does not appear to be any evidence of active infection at this point which is excellent. Overall I am extremely pleased with how things seem to be progressing. Objective Constitutional Well-nourished and well-hydrated in no acute distress. Vitals Time Taken: 9:42 AM, Height: 63 in, Weight: 182 lbs, BMI: 32.2, Temperature: 97.8 F, Pulse: 66 bpm, Respiratory Rate: 18 breaths/min, Blood Pressure: 133/84 mmHg. Respiratory normal breathing without difficulty. Psychiatric this patient is able to make decisions and demonstrates good insight into disease process. Alert and Oriented x 3. pleasant and cooperative. General Notes: Upon inspection patient's wound bed actually showed signs of good granulation and epithelization at this point. There does not appear to be any evidence of active infection which is great news and overall I am extremely pleased with where things stand. No fevers, chills, nausea, vomiting, or diarrhea. Integumentary (Hair, Skin) Wound #1 status is Open. Original cause of wound was Trauma. The wound is located on the Right,Posterior Lower Leg. The wound measures 4cm length x 2cm width x 0.6cm depth; 6.283cm^2 area and 3.77cm^3 volume. There is Fat Layer (Subcutaneous Tissue) exposed. There is no tunneling or undermining noted. There is a medium amount of serosanguineous drainage noted. The wound margin is flat and intact. There is large (67-100%) red, hyper - granulation within the wound bed. There is no necrotic tissue within the wound bed. Assessment Active Problems ICD-10 Unspecified open wound, right lower leg, initial encounter TEJA, JUDICE ([id number]) Non-pressure chronic ulcer of other part of  right lower leg with fat layer exposed Long term (current) use of anticoagulants Atherosclerotic heart disease of native coronary artery without angina pectoris Procedures Wound #1 Pre-procedure diagnosis of Wound #1 is a Trauma, Other located on the Right,Posterior Lower Leg . There was a Three Layer Compression Therapy Procedure by Yevonne Pax, RN. Post procedure Diagnosis Wound #1: Same as Pre-Procedure Plan Follow-up Appointments: Return Appointment in 1 week. - Monday Other: - Nurse visit on Thursday Bathing/ Shower/ Hygiene: May shower with wound dressing protected  with water repellent cover or cast protector. Edema Control - Lymphedema / Segmental Compressive Device / Other: Elevate, Exercise Daily and Avoid Standing for Long Periods of Time. Elevate legs to the level of the heart and pump ankles as often as possible Elevate leg(s) parallel to the floor when sitting. WOUND #1: - Lower Leg Wound Laterality: Right, Posterior Cleanser: Normal Saline (Generic) 2 x Per Week/30 Days Discharge Instructions: Wash your hands with soap and water. Remove old dressing, discard into plastic bag and place into trash. Cleanse the wound with Normal Saline prior to applying a clean dressing using gauze sponges, not tissues or cotton balls. Do not scrub or use excessive force. Pat dry using gauze sponges, not tissue or cotton balls. Cleanser: Soap and Water 2 x Per Week/30 Days Discharge Instructions: Gently cleanse wound with antibacterial soap, rinse and pat dry prior to dressing wounds Primary Dressing: Hydrofera Blue Ready Transfer Foam, 2.5x2.5 (in/in) (Generic) 2 x Per Week/30 Days Discharge Instructions: Apply Hydrofera Blue Ready to wound bed as directed Secondary Dressing: ABD Pad 5x9 (in/in) (Generic) 2 x Per Week/30 Days Discharge Instructions: Cover with ABD pad Compression Wrap: Profore Lite LF 3 Multilayer Compression Bandaging System (Generic) 2 x Per Week/30 Days Discharge  Instructions: Apply 3 multi-layer wrap as prescribed. Compression Wrap: Unna w/Calamine, 4x10 (in/yd) (Generic) 2 x Per Week/30 Days Discharge Instructions: Apply unna paste -3 finger-widths below knee to anchor compression wrap in place. 1. Would recommend currently that we go ahead and continue with the [facility] I think based on a good job so not to make any changes at this point. 2. I am also can recommend that we have the patient continue to elevate his legs much as possible we will also continue with the 3 layer compression wrap which I feel like has been beneficial for him. We will see patient back for reevaluation in 1 week here in the clinic. If anything worsens or changes patient will contact our office for additional recommendations. Electronic Signature(s) Signed: 06/17/2020 2:47:48 PM By: Lenda Kelp PA-C Entered By: Lenda Kelp on 06/17/2020 14:47:48 Jeff Rios ([id number]) -------------------------------------------------------------------------------- SuperBill Details Patient Name: Jeff Rios Date of Service: 06/17/2020 Medical Record Number: [id number] Patient Account Number: [id number] Date of Birth/Sex: 1966/01/01 (54 y.o. M) Treating RN: Yevonne Pax Primary Care Provider: PATIENT, NO Other Clinician: Referring Provider: Cam Hai Treating Provider/Extender: Rowan Blase in Treatment: 1 Diagnosis Coding ICD-10 Codes Code Description [phone] Unspecified open wound, right lower leg, initial encounter L97.812 Non-pressure chronic ulcer of other part of right lower leg with fat layer exposed Z79.01 Long term (current) use of anticoagulants I25.10 Atherosclerotic heart disease of native coronary artery without angina pectoris Facility Procedures CPT4 Code: [id number] Description: (Facility Use Only) [phone] - APPLY MULTLAY COMPRS LWR RT LEG Modifier: Quantity: 1 Physician Procedures CPT4 Code: [id number] Description: 99213 - WC PHYS  LEVEL 3 - EST PT Modifier: Quantity: 1 CPT4 Code: Description: ICD-10 Diagnosis Description S81.801A Unspecified open wound, right lower leg, initial encounter L97.812 Non-pressure chronic ulcer of other part of right lower leg with fat la Z79.01 Long term (current) use of anticoagulants I25.10  Atherosclerotic heart disease of native coronary artery without angina Modifier: yer exposed pectoris Quantity: Electronic Signature(s) Signed: 06/17/2020 2:48:00 PM By: Lenda Kelp PA-C Entered By: Lenda Kelp on 06/17/2020 14:48:00

## 2020-06-17 NOTE — Progress Notes (Addendum)
IRFAN, VEAL ([id number]) Visit Report for 06/17/2020 Arrival Information Details Patient Name: Jeff Rios, Jeff Rios Date of Service: 06/17/2020 9:15 AM Medical Record Number: [id number] Patient Account Number: [id number] Date of Birth/Sex: 12-05-1965 (55 y.o. M) Treating RN: Rogers Blocker Primary Care Loreto Loescher: PATIENT, NO Other Clinician: Referring Blonnie Maske: Cam Hai Treating Dannae Kato/Extender: Rowan Blase in Treatment: 1 Visit Information History Since Last Visit Has Compression in Place as Prescribed: Yes Patient Arrived: Ambulatory Pain Present Now: No Arrival Time: 09:42 Accompanied By: case manager Transfer Assistance: None Patient Identification Verified: Yes Secondary Verification Process Completed: Yes Patient Has Alerts: Yes Patient Alerts: Patient on Blood Thinner PLAVIX Electronic Signature(s) Signed: 06/17/2020 4:00:09 PM By: Phillis Haggis, Dondra Prader RN Entered By: Phillis Haggis, Dondra Prader on 06/17/2020 09:42:23 Jeff Rios ([id number]) -------------------------------------------------------------------------------- Clinic Level of Care Assessment Details Patient Name: Jeff Rios Date of Service: 06/17/2020 9:15 AM Medical Record Number: [id number] Patient Account Number: [id number] Date of Birth/Sex: 12-24-1965 (54 y.o. M) Treating RN: Yevonne Pax Primary Care Othel Hoogendoorn: PATIENT, NO Other Clinician: Referring Damarian Priola: Cam Hai Treating Thanvi Blincoe/Extender: Rowan Blase in Treatment: 1 Clinic Level of Care Assessment Items TOOL 1 Quantity Score []  - Use when EandM and Procedure is performed on INITIAL visit 0 ASSESSMENTS - Nursing Assessment / Reassessment []  - General Physical Exam (combine w/ comprehensive assessment (listed just below) when performed on new 0 pt. evals) []  - 0 Comprehensive Assessment (HX, ROS, Risk Assessments, Wounds Hx, etc.) ASSESSMENTS - Wound and Skin Assessment / Reassessment []  - Dermatologic / Skin  Assessment (not related to wound area) 0 ASSESSMENTS - Ostomy and/or Continence Assessment and Care []  - Incontinence Assessment and Management 0 []  - 0 Ostomy Care Assessment and Management (repouching, etc.) PROCESS - Coordination of Care []  - Simple Patient / Family Education for ongoing care 0 []  - 0 Complex (extensive) Patient / Family Education for ongoing care []  - 0 Staff obtains , Records, Test Results / Process Orders []  - 0 Staff telephones HHA, Nursing Homes / Clarify orders / etc []  - 0 Routine Transfer to another Facility (non-emergent condition) []  - 0 Routine Hospital Admission (non-emergent condition) []  - 0 New Admissions / / Ordering NPWT, Apligraf, etc. []  - 0 Emergency Hospital Admission (emergent condition) PROCESS - Special Needs []  - Pediatric / Minor Patient Management 0 []  - 0 Isolation Patient Management []  - 0 Hearing / Language / Visual special needs []  - 0 Assessment of Community assistance (transportation, D/C planning, etc.) []  - 0 Additional assistance / Altered mentation []  - 0 Support Surface(s) Assessment (bed, cushion, seat, etc.) INTERVENTIONS - Miscellaneous []  - External ear exam 0 []  - 0 Patient Transfer (multiple staff / / Similar devices) []  - 0 Simple Staple / Suture removal (25 or less) []  - 0 Complex Staple / Suture removal (26 or more) []  - 0 Hypo/Hyperglycemic Management (do not check if billed separately) []  - 0 Ankle / Brachial Index (ABI) - do not check if billed separately Has the patient been seen at the hospital within the last three years: Yes Total Score: 0 Level Of Care: ____ ( ) Electronic Signature(s) Signed: 06/17/2020 4:08:54 PM By: RN Entered By: on 06/17/2020 10:14:13 Chiropractor ( ) -------------------------------------------------------------------------------- Compression Therapy  Details Patient Name: Date of Service: 06/17/2020 9:15 AM Medical Record Number: Patient Account Number: Manufacturing engineer Date of Birth/Sex: [birth year] (55 y.o. M) Treating RN: Primary Care Kanon Colunga: PATIENT, NO Other Clinician: Referring Airen Dales: Treating  Cynthia Cogle/Extender: Allen DerryStone, Hoyt Weeks in Treatment: 1 Compression Therapy Performed for Wound Assessment: Wound #1 Right,Posterior Lower Leg Performed By: Clinician Yevonne PaxEpps, Carrie, RN Compression Type: Three Layer Post Procedure Diagnosis Same as Pre-procedure Electronic Signature(s) Signed: 06/17/2020 4:08:54 PM By: Yevonne PaxEpps, Carrie RN Entered By: Yevonne PaxEpps, Carrie on 06/17/2020 10:13:50 Jeff Rios, Jeff Rios ([id number]) -------------------------------------------------------------------------------- Encounter Discharge Information Details Patient Name: Jeff Rios, Jeff Rios Date of Service: 06/17/2020 9:15 AM Medical Record Number: [id number] Patient Account Number: [id number] Date of Birth/Sex: 1965-10-19 (55 y.o. M) Treating RN: Huel CoventryWoody, Kim Primary Care Marili Vader: PATIENT, NO Other Clinician: Referring Ger Ringenberg: Cam Haiarr, Jacob Treating Aldric Wenzler/Extender: Rowan BlaseStone, Hoyt Weeks in Treatment: 1 Encounter Discharge Information Items Discharge Condition: Stable Ambulatory Status: Ambulatory Discharge Destination: Home Transportation: Private Auto Accompanied By: case worker Schedule Follow-up Appointment: Yes Clinical Summary of Care: Electronic Signature(s) Signed: 06/17/2020 4:29:15 PM By: Elliot GurneyWoody, BSN, RN, CWS, Kim RN, BSN Entered By: Elliot GurneyWoody, BSN, RN, CWS, Kim on 06/17/2020 10:29:08 Jeff Rios, Jeff Rios ([id number]) -------------------------------------------------------------------------------- Lower Extremity Assessment Details Patient Name: Jeff Rios, Jeff Rios Date of Service: 06/17/2020 9:15 AM Medical Record Number: [id number] Patient Account Number: [id number] Date of Birth/Sex: 1965-10-19 (54 y.o.  M) Treating RN: Rogers BlockerSanchez, Kenia Primary Care Adella Manolis: PATIENT, NO Other Clinician: Referring Dimitriy Carreras: Cam Haiarr, Jacob Treating Tyleah Loh/Extender: Rowan BlaseStone, Hoyt Weeks in Treatment: 1 Edema Assessment Assessed: [Left: No] [Right: Yes] Edema: [Left: N] [Right: o] Calf Left: Right: Point of Measurement: 30 cm From Medial Instep 33 cm Ankle Left: Right: Point of Measurement: 10 cm From Medial Instep 23.5 cm Electronic Signature(s) Signed: 06/17/2020 4:00:09 PM By: Phillis HaggisSanchez Pereyda, Dondra PraderKenia RN Entered By: Phillis HaggisSanchez Pereyda, Dondra PraderKenia on 06/17/2020 09:52:03 Jeff Rios, Joziyah ([id number]) -------------------------------------------------------------------------------- Multi Wound Chart Details Patient Name: Jeff Rios, Hau Date of Service: 06/17/2020 9:15 AM Medical Record Number: [id number] Patient Account Number: [id number] Date of Birth/Sex: 1965-10-19 (55 y.o. M) Treating RN: Yevonne PaxEpps, Carrie Primary Care Arneisha Kincannon: PATIENT, NO Other Clinician: Referring Pheonix Wisby: Cam Haiarr, Jacob Treating Deija Buhrman/Extender: Rowan BlaseStone, Hoyt Weeks in Treatment: 1 Vital Signs Height(in): 63 Pulse(bpm): 66 Weight(lbs): 182 Blood Pressure(mmHg): 133/84 Body Mass Index(BMI): 32 Temperature(F): 97.8 Respiratory Rate(breaths/min): 18 Photos: [N/A:N/A] Wound Location: Right, Posterior Lower Leg N/A N/A Wounding Event: Trauma N/A N/A Primary Etiology: Trauma, Other N/A N/A Comorbid History: Coronary Artery Disease, N/A N/A Hypertension, Myocardial Infarction Date Acquired: 05/01/2020 N/A N/A Weeks of Treatment: 1 N/A N/A Wound Status: Open N/A N/A Measurements L x W x D (cm) 4x2x0.6 N/A N/A Area (cm) : 6.283 N/A N/A Volume (cm) : 3.77 N/A N/A % Reduction in Area: 17.40% N/A N/A % Reduction in Volume: 29.20% N/A N/A Classification: Full Thickness Without Exposed N/A N/A Support Structures Exudate Amount: Medium N/A N/A Exudate Type: Serosanguineous N/A N/A Exudate Color: red, brown N/A N/A Wound Margin: Flat and  Intact N/A N/A Granulation Amount: Large (67-100%) N/A N/A Granulation Quality: Red, Hyper-granulation N/A N/A Necrotic Amount: None Present (0%) N/A N/A Exposed Structures: Fat Layer (Subcutaneous Tissue): N/A N/A Yes Fascia: No Tendon: No Muscle: No Joint: No Bone: No Epithelialization: None N/A N/A Treatment Notes Electronic Signature(s) Signed: 06/17/2020 4:08:54 PM By: Yevonne PaxEpps, Carrie RN Entered By: Yevonne PaxEpps, Carrie on 06/17/2020 10:13:05 Jeff Rios, Josiyah ([id number]) -------------------------------------------------------------------------------- Multi-Disciplinary Care Plan Details Patient Name: Jeff Rios, Jeff Rios Date of Service: 06/17/2020 9:15 AM Medical Record Number: [id number] Patient Account Number: [id number] Date of Birth/Sex: 1965-10-19 (54 y.o. M) Treating RN: Yevonne PaxEpps, Carrie Primary Care Jalyne Brodzinski: PATIENT, NO Other Clinician: Referring Shamiracle Gorden: Cam Haiarr, Jacob Treating Romy Mcgue/Extender: Rowan BlaseStone, Hoyt Weeks in Treatment: 1 Active Inactive Wound/Skin Impairment Nursing Diagnoses: Knowledge deficit related to ulceration/compromised skin integrity Goals: Patient/caregiver will verbalize understanding  of skin care regimen Date Initiated: 06/10/2020 Target Resolution Date: 07/08/2020 Goal Status: Active Ulcer/skin breakdown will have a volume reduction of 30% by week 4 Date Initiated: 06/10/2020 Target Resolution Date: 07/08/2020 Goal Status: Active Ulcer/skin breakdown will have a volume reduction of 50% by week 8 Date Initiated: 06/10/2020 Target Resolution Date: 08/08/2020 Goal Status: Active Ulcer/skin breakdown will have a volume reduction of 80% by week 12 Date Initiated: 06/10/2020 Target Resolution Date: 09/07/2020 Goal Status: Active Ulcer/skin breakdown will heal within 14 weeks Date Initiated: 06/10/2020 Target Resolution Date: 10/08/2020 Goal Status: Active Interventions: Assess patient/caregiver ability to obtain necessary supplies Assess patient/caregiver ability to  perform ulcer/skin care regimen upon admission and as needed Assess ulceration(s) every visit Notes: Electronic Signature(s) Signed: 06/17/2020 4:08:54 PM By: Yevonne Pax RN Entered By: Yevonne Pax on 06/17/2020 10:12:44 Jeff Rios ([id number]) -------------------------------------------------------------------------------- Pain Assessment Details Patient Name: Jeff Rios Date of Service: 06/17/2020 9:15 AM Medical Record Number: [id number] Patient Account Number: [id number] Date of Birth/Sex: 02/18/1966 (55 y.o. M) Treating RN: Rogers Blocker Primary Care Emmalynne Courtney: PATIENT, NO Other Clinician: Referring Shanaiya Bene: Cam Hai Treating Mahaley Schwering/Extender: Rowan Blase in Treatment: 1 Active Problems Location of Pain Severity and Description of Pain Patient Has Paino No Site Locations Rate the pain. Current Pain Level: 0 Pain Management and Medication Current Pain Management: Electronic Signature(s) Signed: 06/17/2020 4:00:09 PM By: Phillis Haggis, Dondra Prader RN Entered By: Phillis Haggis, Kenia on 06/17/2020 09:43:43 Jeff Rios ([id number]) -------------------------------------------------------------------------------- Patient/Caregiver Education Details Patient Name: Jeff Rios Date of Service: 06/17/2020 9:15 AM Medical Record Number: [id number] Patient Account Number: [id number] Date of Birth/Gender: June 01, 1965 (55 y.o. M) Treating RN: Yevonne Pax Primary Care Physician: PATIENT, NO Other Clinician: Referring Physician: Cam Hai Treating Physician/Extender: Rowan Blase in Treatment: 1 Education Assessment Education Provided To: Patient Education Topics Provided Wound/Skin Impairment: Methods: Explain/Verbal Responses: State content correctly Electronic Signature(s) Signed: 06/17/2020 4:08:54 PM By: Yevonne Pax RN Entered By: Yevonne Pax on 06/17/2020 10:14:33 Jeff Rios  ([id number]) -------------------------------------------------------------------------------- Wound Assessment Details Patient Name: Jeff Rios Date of Service: 06/17/2020 9:15 AM Medical Record Number: [id number] Patient Account Number: [id number] Date of Birth/Sex: 1966/02/01 (55 y.o. M) Treating RN: Rogers Blocker Primary Care Osborn Pullin: PATIENT, NO Other Clinician: Referring Rikia Sukhu: Cam Hai Treating Areya Lemmerman/Extender: Rowan Blase in Treatment: 1 Wound Status Wound Number: 1 Primary Trauma, Other Etiology: Wound Location: Right, Posterior Lower Leg Wound Status: Open Wounding Event: Trauma Comorbid Coronary Artery Disease, Hypertension, Myocardial Date Acquired: 05/01/2020 History: Infarction Weeks Of Treatment: 1 Clustered Wound: No Photos Wound Measurements Length: (cm) 4 Width: (cm) 2 Depth: (cm) 0.6 Area: (cm) 6.283 Volume: (cm) 3.77 % Reduction in Area: 17.4% % Reduction in Volume: 29.2% Epithelialization: None Tunneling: No Undermining: No Wound Description Classification: Full Thickness Without Exposed Support Structures Wound Margin: Flat and Intact Exudate Amount: Medium Exudate Type: Serosanguineous Exudate Color: red, brown Foul Odor After Cleansing: No Slough/Fibrino Yes Wound Bed Granulation Amount: Large (67-100%) Exposed Structure Granulation Quality: Red, Hyper-granulation Fascia Exposed: No Necrotic Amount: None Present (0%) Fat Layer (Subcutaneous Tissue) Exposed: Yes Tendon Exposed: No Muscle Exposed: No Joint Exposed: No Bone Exposed: No Treatment Notes Wound #1 (Lower Leg) Wound Laterality: Right, Posterior Cleanser Normal Saline Discharge Instruction: Wash your hands with soap and water. Remove old dressing, discard into plastic bag and place into trash. Cleanse the wound with Normal Saline prior to applying a clean dressing using gauze sponges, not tissues or cotton balls. Do not scrub or use excessive force. Pat  dry using gauze sponges, not tissue  or cotton balls. HUNNER, GARCON ([id number]) Soap and Water Discharge Instruction: Gently cleanse wound with antibacterial soap, rinse and pat dry prior to dressing wounds Peri-Wound Care Topical Primary Dressing Hydrofera Blue Ready Transfer Foam, 2.5x2.5 (in/in) Quantity: 1 Discharge Instruction: Apply Hydrofera Blue Ready to wound bed as directed Secondary Dressing ABD Pad 5x9 (in/in) Quantity: 1 Discharge Instruction: Cover with ABD pad Secured With Compression Wrap Profore Lite LF 3 Multilayer Compression Bandaging System Quantity: 1 Discharge Instruction: Apply 3 multi-layer wrap as prescribed. Unna w/Calamine, 4x10 (in/yd) Quantity: 1 Discharge Instruction: Apply unna paste -3 finger-widths below knee to anchor compression wrap in place. Compression Stockings Add-Ons Electronic Signature(s) Signed: 06/17/2020 4:00:09 PM By: Phillis Haggis, Dondra Prader RN Entered By: Phillis Haggis, Dondra Prader on 06/17/2020 09:50:25 Jeff Rios ([id number]) -------------------------------------------------------------------------------- Vitals Details Patient Name: Jeff Rios Date of Service: 06/17/2020 9:15 AM Medical Record Number: [id number] Patient Account Number: [id number] Date of Birth/Sex: 1965-12-24 (55 y.o. M) Treating RN: Rogers Blocker Primary Care Kiptyn Rafuse: PATIENT, NO Other Clinician: Referring Shardai Star: Cam Hai Treating Dalayna Lauter/Extender: Rowan Blase in Treatment: 1 Vital Signs Time Taken: 09:42 Temperature (F): 97.8 Height (in): 63 Pulse (bpm): 66 Weight (lbs): 182 Respiratory Rate (breaths/min): 18 Body Mass Index (BMI): 32.2 Blood Pressure (mmHg): 133/84 Reference Range: 80 - 120 mg / dl Electronic Signature(s) Signed: 06/17/2020 4:00:09 PM By: Phillis Haggis, Dondra Prader RN Entered By: Phillis Haggis, Dondra Prader on 06/17/2020 09:43:37

## 2020-06-20 ENCOUNTER — Other Ambulatory Visit: Payer: Self-pay

## 2020-06-20 DIAGNOSIS — L97812 Non-pressure chronic ulcer of other part of right lower leg with fat layer exposed: Secondary | ICD-10-CM | POA: Diagnosis not present

## 2020-06-20 NOTE — Progress Notes (Signed)
Jeff Rios, Jeff Rios ([id number]) Visit Report for 06/20/2020 Arrival Information Details Patient Name: Jeff Rios Date of Service: 06/20/2020 10:30 AM Medical Record Number: [id number] Patient Account Number: [id number] Date of Birth/Sex: 1966-03-18 (55 y.o. M) Treating RN: Rogers Blocker Primary Care Axtyn Woehler: PATIENT, NO Other Clinician: Referring Ravneet Spilker: Cam Hai Treating Scottlynn Lindell/Extender: Rowan Blase in Treatment: 1 Visit Information History Since Last Visit Pain Present Now: No Patient Arrived: Ambulatory Arrival Time: 10:15 Accompanied By: self Transfer Assistance: None Patient Identification Verified: Yes Secondary Verification Process Completed: Yes Patient Has Alerts: Yes Patient Alerts: Patient on Blood Thinner PLAVIX Electronic Signature(s) Signed: 06/20/2020 12:41:55 PM By: Phillis Haggis, Dondra Prader RN Entered By: Phillis Haggis, Dondra Prader on 06/20/2020 10:28:15 Jeff Rios ([id number]) -------------------------------------------------------------------------------- Clinic Level of Care Assessment Details Patient Name: Jeff Rios Date of Service: 06/20/2020 10:30 AM Medical Record Number: [id number] Patient Account Number: [id number] Date of Birth/Sex: October 04, 1965 (55 y.o. M) Treating RN: Rogers Blocker Primary Care Guillermo Difrancesco: PATIENT, NO Other Clinician: Referring Jeremih Dearmas: Cam Hai Treating Shaquina Gillham/Extender: Rowan Blase in Treatment: 1 Clinic Level of Care Assessment Items TOOL 1 Quantity Score []  - Use when EandM and Procedure is performed on INITIAL visit 0 ASSESSMENTS - Nursing Assessment / Reassessment []  - General Physical Exam (combine w/ comprehensive assessment (listed just below) when performed on new 0 pt. evals) []  - 0 Comprehensive Assessment (HX, ROS, Risk Assessments, Wounds Hx, etc.) ASSESSMENTS - Wound and Skin Assessment / Reassessment []  - Dermatologic / Skin Assessment (not related to wound area) 0 ASSESSMENTS  - Ostomy and/or Continence Assessment and Care []  - Incontinence Assessment and Management 0 []  - 0 Ostomy Care Assessment and Management (repouching, etc.) PROCESS - Coordination of Care []  - Simple Patient / Family Education for ongoing care 0 []  - 0 Complex (extensive) Patient / Family Education for ongoing care []  - 0 Staff obtains , Records, Test Results / Process Orders []  - 0 Staff telephones HHA, Nursing Homes / Clarify orders / etc []  - 0 Routine Transfer to another Facility (non-emergent condition) []  - 0 Routine Hospital Admission (non-emergent condition) []  - 0 New Admissions / / Ordering NPWT, Apligraf, etc. []  - 0 Emergency Hospital Admission (emergent condition) PROCESS - Special Needs []  - Pediatric / Minor Patient Management 0 []  - 0 Isolation Patient Management []  - 0 Hearing / Language / Visual special needs []  - 0 Assessment of Community assistance (transportation, D/C planning, etc.) []  - 0 Additional assistance / Altered mentation []  - 0 Support Surface(s) Assessment (bed, cushion, seat, etc.) INTERVENTIONS - Miscellaneous []  - External ear exam 0 []  - 0 Patient Transfer (multiple staff / / Similar devices) []  - 0 Simple Staple / Suture removal (25 or less) []  - 0 Complex Staple / Suture removal (26 or more) []  - 0 Hypo/Hyperglycemic Management (do not check if billed separately) []  - 0 Ankle / Brachial Index (ABI) - do not check if billed separately Has the patient been seen at the hospital within the last three years: Yes Total Score: 0 Level Of Care: ____ ( ) Electronic Signature(s) Signed: 06/20/2020 12:41:55 PM By: , RN Entered By: , Chiropractor on 06/20/2020 10:29:15 ( ) -------------------------------------------------------------------------------- Compression Therapy Details Patient Name: Date of Service: 06/20/2020 10:30 AM Medical Record Number: Patient Account Number: Date of Birth/Sex: 1965/12/26 (55 y.o. M) Treating RN: Primary Care Ron Junco: PATIENT, NO Other Clinician: Referring Glynn Yepes: Treating Quatavious Rossa/Extender: in Treatment:  1 Compression Therapy Performed for Wound Assessment: Wound #1 Right,Posterior Lower Leg Performed By: Ovidio Hanger, RN Compression Type: Three Layer Pre Treatment ABI: 1.2 Electronic Signature(s) Signed: 06/20/2020 12:41:55 PM By: Phillis Haggis, Dondra Prader RN Entered By: Phillis Haggis, Dondra Prader on 06/20/2020 10:28:49 Jeff Rios ([id number]) -------------------------------------------------------------------------------- Encounter Discharge Information Details Patient Name: Jeff Rios Date of Service: 06/20/2020 10:30 AM Medical Record Number: [id number] Patient Account Number: [id number] Date of Birth/Sex: January 22, 1966 (55 y.o. M) Treating RN: Rogers Blocker Primary Care Felice Hope: PATIENT, NO Other Clinician: Referring Sloane Junkin: Cam Hai Treating Kaityln Kallstrom/Extender: Rowan Blase in Treatment: 1 Encounter Discharge Information Items Discharge Condition: Stable Ambulatory Status: Ambulatory Discharge Destination: Home Transportation: Private Auto Accompanied By: self Schedule Follow-up Appointment: Yes Clinical Summary of Care: Electronic Signature(s) Signed: 06/20/2020 12:41:55 PM By: Phillis Haggis, Dondra Prader RN Entered By: Phillis Haggis, Dondra Prader on 06/20/2020 10:29:08 Jeff Rios ([id number]) -------------------------------------------------------------------------------- Wound Assessment Details Patient Name: Jeff Rios Date of Service: 06/20/2020 10:30 AM Medical Record Number: [id number] Patient Account Number: [id number] Date of Birth/Sex: January 06, 1966 (55 y.o. M) Treating RN: Rogers Blocker Primary Care Ercia Crisafulli:  PATIENT, NO Other Clinician: Referring Mahagony Grieb: Cam Hai Treating Jordany Russett/Extender: Rowan Blase in Treatment: 1 Wound Status Wound Number: 1 Primary Trauma, Other Etiology: Wound Location: Right, Posterior Lower Leg Wound Status: Open Wounding Event: Trauma Comorbid Coronary Artery Disease, Hypertension, Myocardial Date Acquired: 05/01/2020 History: Infarction Weeks Of Treatment: 1 Clustered Wound: No Wound Measurements Length: (cm) 4 Width: (cm) 2 Depth: (cm) 0.6 Area: (cm) 6.283 Volume: (cm) 3.77 % Reduction in Area: 17.4% % Reduction in Volume: 29.2% Epithelialization: None Wound Description Classification: Full Thickness Without Exposed Support Structures Wound Margin: Flat and Intact Exudate Amount: Medium Exudate Type: Serosanguineous Exudate Color: red, brown Foul Odor After Cleansing: No Slough/Fibrino Yes Wound Bed Granulation Amount: Large (67-100%) Exposed Structure Granulation Quality: Red, Hyper-granulation Fascia Exposed: No Necrotic Amount: None Present (0%) Fat Layer (Subcutaneous Tissue) Exposed: Yes Tendon Exposed: No Muscle Exposed: No Joint Exposed: No Bone Exposed: No Treatment Notes Wound #1 (Lower Leg) Wound Laterality: Right, Posterior Cleanser Normal Saline Discharge Instruction: Wash your hands with soap and water. Remove old dressing, discard into plastic bag and place into trash. Cleanse the wound with Normal Saline prior to applying a clean dressing using gauze sponges, not tissues or cotton balls. Do not scrub or use excessive force. Pat dry using gauze sponges, not tissue or cotton balls. Soap and Water Discharge Instruction: Gently cleanse wound with antibacterial soap, rinse and pat dry prior to dressing wounds Peri-Wound Care Topical Primary Dressing Hydrofera Blue Ready Transfer Foam, 2.5x2.5 (in/in) Discharge Instruction: Apply Hydrofera Blue Ready to wound bed as directed Secondary Dressing ABD Pad 5x9  (in/in) Discharge Instruction: Cover with ABD pad BURLIE, CAJAMARCA ([id number]) Secured With Compression Wrap Profore Lite LF 3 Multilayer Compression Bandaging System Discharge Instruction: Apply 3 multi-layer wrap as prescribed. Unna w/Calamine, 4x10 (in/yd) Discharge Instruction: Apply unna paste -3 finger-widths below knee to anchor compression wrap in place. Compression Stockings Add-Ons Electronic Signature(s) Signed: 06/20/2020 12:41:55 PM By: Phillis Haggis, Dondra Prader RN Entered By: Phillis Haggis, Dondra Prader on 06/20/2020 10:28:32

## 2020-06-21 ENCOUNTER — Other Ambulatory Visit: Payer: Medicare Other

## 2020-06-24 ENCOUNTER — Ambulatory Visit: Payer: PRIVATE HEALTH INSURANCE | Admitting: Physician Assistant

## 2020-06-25 ENCOUNTER — Encounter: Payer: Medicare Other | Admitting: Physician Assistant

## 2020-06-25 ENCOUNTER — Other Ambulatory Visit: Payer: Self-pay

## 2020-06-25 DIAGNOSIS — L97812 Non-pressure chronic ulcer of other part of right lower leg with fat layer exposed: Secondary | ICD-10-CM | POA: Diagnosis not present

## 2020-06-25 NOTE — Progress Notes (Addendum)
Jeff Rios ([id number]) Visit Report for 06/25/2020 Chief Complaint Document Details Patient Name: Jeff Rios, Jeff Rios Date of Service: 06/25/2020 1:30 PM Medical Record Number: [id number] Patient Account Number: [id number] Date of Birth/Sex: 1965/11/12 (55 y.o. M) Treating RN: Rogers Blocker Primary Care Provider: PATIENT, NO Other Clinician: Referring Provider: Cam Hai Treating Provider/Extender: Rowan Blase in Treatment: 2 Information Obtained from: Patient Chief Complaint Right LE Electronic Signature(s) Signed: 06/25/2020 1:31:54 PM By: Lenda Kelp PA-C Entered By: Lenda Kelp on 06/25/2020 13:31:54 Jeff Rios ([id number]) -------------------------------------------------------------------------------- HPI Details Patient Name: Jeff Rios Date of Service: 06/25/2020 1:30 PM Medical Record Number: [id number] Patient Account Number: [id number] Date of Birth/Sex: 1965-12-03 (54 y.o. M) Treating RN: Rogers Blocker Primary Care Provider: PATIENT, NO Other Clinician: Referring Provider: Cam Hai Treating Provider/Extender: Rowan Blase in Treatment: 2 History of Present Illness HPI Description: 06/10/2020 upon evaluation today patient presents for initial evaluation here in our clinic concerning issue with a wound which began on 29 December. Subsequently the patient tells me that this is a work comp related injury that he was working at [employer] when one of the bundles of cardboard boxes that they compile unfortunately fell off of a what sounds to be dolly struck him in the back of the leg. He initially had a small laceration. Unfortunately this developed into a much bigger issue as noted today where there is necrotic tissue in the base of the wound as well. There is a lot of swelling around the edges of the wound as well. It obviously seems like this turned into a hematoma which is not farfetched considering the fact that the patient is on  long-term anticoagulant therapy secondary to his cardiac history. He does have a history of myocardial infarction even at such a young age. He is on Eliquis I do believe at this point. Subsequently the patient states that he does have some discomfort here is not excruciating but nonetheless it is problematic for him. 06/17/2020 upon evaluation today patient appears to be doing better in regard to his wound on the posterior aspect of his right leg. He has been tolerating the dressing changes without complication even in 1 week's time I do see a significant improvement here which is great news. There does not appear to be any evidence of active infection at this point which is excellent. Overall I am extremely pleased with how things seem to be progressing. 06/25/2020 upon evaluation today patient appears to be doing well with regard to his wound. This is measuring smaller and looks to be doing much better I do think the [facility] is doing a good job. Electronic Signature(s) Signed: 06/25/2020 1:49:39 PM By: Lenda Kelp PA-C Entered By: Lenda Kelp on 06/25/2020 13:49:38 Jeff Rios ([id number]) -------------------------------------------------------------------------------- Physical Exam Details Patient Name: Jeff Rios Date of Service: 06/25/2020 1:30 PM Medical Record Number: [id number] Patient Account Number: [id number] Date of Birth/Sex: 1966/04/09 (55 y.o. M) Treating RN: Rogers Blocker Primary Care Provider: PATIENT, NO Other Clinician: Referring Provider: Cam Hai Treating Provider/Extender: Rowan Blase in Treatment: 2 Constitutional Well-nourished and well-hydrated in no acute distress. Respiratory normal breathing without difficulty. Psychiatric this patient is able to make decisions and demonstrates good insight into disease process. Alert and Oriented x 3. pleasant and cooperative. Notes Patient's wound bed currently showed signs of good  granulation and epithelization. I do feel like the Hydrofera Blue is done a great job for him I would recommend such that we continue. Overall I feel like he  is doing excellent at this point. Electronic Signature(s) Signed: 06/25/2020 3:17:15 PM By: Lenda KelpStone III, Dewight Catino PA-C Entered By: Lenda KelpStone III, Harryette Shuart on 06/25/2020 15:17:15 Jeff Rios, Jeff Rios ([id number]) -------------------------------------------------------------------------------- Physician Orders Details Patient Name: Jeff Rios, Jeff Rios Date of Service: 06/25/2020 1:30 PM Medical Record Number: [id number] Patient Account Number: [id number] Date of Birth/Sex: October 18, 1965 (54 y.o. M) Treating RN: Rogers BlockerSanchez, Kenia Primary Care Provider: PATIENT, NO Other Clinician: Referring Provider: Cam Haiarr, Jacob Treating Provider/Extender: Rowan BlaseStone, Allisa Einspahr Weeks in Treatment: 2 Verbal / Phone Orders: No Diagnosis Coding ICD-10 Coding Code Description S81.801A Unspecified open wound, right lower leg, initial encounter L97.812 Non-pressure chronic ulcer of other part of right lower leg with fat layer exposed Z79.01 Long term (current) use of anticoagulants I25.10 Atherosclerotic heart disease of native coronary artery without angina pectoris Follow-up Appointments Wound #1 Right,Posterior Lower Leg o Return Appointment in 1 week. Bathing/ Shower/ Hygiene Wound #1 Right,Posterior Lower Leg o May shower with wound dressing protected with water repellent cover or cast protector. Edema Control - Lymphedema / Segmental Compressive Device / Other Right Lower Extremity o Optional: One layer of unna paste to top of compression wrap (to act as an anchor). o 3 Layer Compression System for Lymphedema. o Elevate, Exercise Daily and Avoid Standing for Long Periods of Time. o Elevate legs to the level of the heart and pump ankles as often as possible o Elevate leg(s) parallel to the floor when sitting. Additional Orders / Instructions Wound #1 Right,Posterior  Lower Leg o OK to return to work with the following restrictions: - 2-3 days a week, 5 hours a day Wound Treatment Wound #1 - Lower Leg Wound Laterality: Right, Posterior Cleanser: Normal Saline (Generic) 1 x Per Week/30 Days Discharge Instructions: Wash your hands with soap and water. Remove old dressing, discard into plastic bag and place into trash. Cleanse the wound with Normal Saline prior to applying a clean dressing using gauze sponges, not tissues or cotton balls. Do not scrub or use excessive force. Pat dry using gauze sponges, not tissue or cotton balls. Cleanser: Soap and Water 1 x Per Week/30 Days Discharge Instructions: Gently cleanse wound with antibacterial soap, rinse and pat dry prior to dressing wounds Primary Dressing: Hydrofera Blue Ready Transfer Foam, 2.5x2.5 (in/in) (Generic) 1 x Per Week/30 Days Discharge Instructions: Apply Hydrofera Blue Ready to fit wound bed Secondary Dressing: ABD Pad 5x9 (in/in) (Generic) 1 x Per Week/30 Days Discharge Instructions: Cover with ABD pad Compression Wrap: Profore Lite LF 3 Multilayer Compression Bandaging System (Generic) 1 x Per Week/30 Days Discharge Instructions: Apply 3 multi-layer wrap as prescribed. Electronic Signature(s) Signed: 06/25/2020 5:02:46 PM By: Phillis HaggisSanchez Pereyda, Dondra PraderKenia RN Signed: 06/27/2020 5:47:45 PM By: Clabe SealStone III, Delyle Weider PA-C Jeff Rios, Jeff Rios ([id number]) Entered By: Phillis HaggisSanchez Pereyda, Dondra PraderKenia on 06/25/2020 13:41:56 Jeff Rios, Jeff Rios ([id number]) -------------------------------------------------------------------------------- Problem List Details Patient Name: Jeff Rios, Jeff Rios Date of Service: 06/25/2020 1:30 PM Medical Record Number: [id number] Patient Account Number: [id number] Date of Birth/Sex: October 18, 1965 (55 y.o. M) Treating RN: Rogers BlockerSanchez, Kenia Primary Care Provider: PATIENT, NO Other Clinician: Referring Provider: Cam Haiarr, Jacob Treating Provider/Extender: Rowan BlaseStone, Akyah Lagrange Weeks in Treatment: 2 Active  Problems ICD-10 Encounter Code Description Active Date MDM Diagnosis S81.801A Unspecified open wound, right lower leg, initial encounter 06/10/2020 No Yes L97.812 Non-pressure chronic ulcer of other part of right lower leg with fat layer 06/10/2020 No Yes exposed Z79.01 Long term (current) use of anticoagulants 06/10/2020 No Yes I25.10 Atherosclerotic heart disease of native coronary artery without angina 06/10/2020 No Yes pectoris Inactive Problems Resolved Problems Electronic Signature(s) Signed: 06/25/2020 1:30:30  PM By: Lenda Kelp PA-C Entered By: Lenda Kelp on 06/25/2020 13:30:30 Jeff Rios ([id number]) -------------------------------------------------------------------------------- Progress Note Details Patient Name: Jeff Rios Date of Service: 06/25/2020 1:30 PM Medical Record Number: [id number] Patient Account Number: [id number] Date of Birth/Sex: [birth year] (55 y.o. M) Treating RN: Rogers Blocker Primary Care Provider: PATIENT, NO Other Clinician: Referring Provider: Cam Hai Treating Provider/Extender: Rowan Blase in Treatment: 2 Subjective Chief Complaint Information obtained from Patient Right LE History of Present Illness (HPI) 06/10/2020 upon evaluation today patient presents for initial evaluation here in our clinic concerning issue with a wound which began on 29 December. Subsequently the patient tells me that this is a work comp related injury that he was working at [employer] when one of the bundles of cardboard boxes that they compile unfortunately fell off of a what sounds to be dolly struck him in the back of the leg. He initially had a small laceration. Unfortunately this developed into a much bigger issue as noted today where there is necrotic tissue in the base of the wound as well. There is a lot of swelling around the edges of the wound as well. It obviously seems like this turned into a hematoma which is not farfetched considering  the fact that the patient is on long-term anticoagulant therapy secondary to his cardiac history. He does have a history of myocardial infarction even at such a young age. He is on Eliquis I do believe at this point. Subsequently the patient states that he does have some discomfort here is not excruciating but nonetheless it is problematic for him. 06/17/2020 upon evaluation today patient appears to be doing better in regard to his wound on the posterior aspect of his right leg. He has been tolerating the dressing changes without complication even in 1 week's time I do see a significant improvement here which is great news. There does not appear to be any evidence of active infection at this point which is excellent. Overall I am extremely pleased with how things seem to be progressing. 06/25/2020 upon evaluation today patient appears to be doing well with regard to his wound. This is measuring smaller and looks to be doing much better I do think the [facility] is doing a good job. Objective Constitutional Well-nourished and well-hydrated in no acute distress. Vitals Time Taken: 1:24 PM, Height: 63 in, Weight: 182 lbs, BMI: 32.2, Temperature: 97.8 F, Pulse: 68 bpm, Respiratory Rate: 18 breaths/min, Blood Pressure: 128/86 mmHg. Respiratory normal breathing without difficulty. Psychiatric this patient is able to make decisions and demonstrates good insight into disease process. Alert and Oriented x 3. pleasant and cooperative. General Notes: Patient's wound bed currently showed signs of good granulation and epithelization. I do feel like the Hydrofera Blue is done a great job for him I would recommend such that we continue. Overall I feel like he is doing excellent at this point. Integumentary (Hair, Skin) Wound #1 status is Open. Original cause of wound was Trauma. The date acquired was: 05/01/2020. The wound has been in treatment 2 weeks. The wound is located on the Right,Posterior Lower  Leg. The wound measures 3.2cm length x 1.5cm width x 0.4cm depth; 3.77cm^2 area and 1.508cm^3 volume. There is Fat Layer (Subcutaneous Tissue) exposed. There is no tunneling or undermining noted. There is a medium amount of serosanguineous drainage noted. The wound margin is flat and intact. There is large (67-100%) red, hyper - granulation within the wound bed. There is no necrotic tissue within the wound  bed. Assessment Jeff Rios, Jeff Rios ([id number]) Active Problems ICD-10 Unspecified open wound, right lower leg, initial encounter Non-pressure chronic ulcer of other part of right lower leg with fat layer exposed Long term (current) use of anticoagulants Atherosclerotic heart disease of native coronary artery without angina pectoris Procedures Wound #1 Pre-procedure diagnosis of Wound #1 is a Trauma, Other located on the Right,Posterior Lower Leg . There was a Three Layer Compression Therapy Procedure with a pre-treatment ABI of 1.2 by Rogers Blocker, RN. Post procedure Diagnosis Wound #1: Same as Pre-Procedure Plan Follow-up Appointments: Wound #1 Right,Posterior Lower Leg: Return Appointment in 1 week. Bathing/ Shower/ Hygiene: Wound #1 Right,Posterior Lower Leg: May shower with wound dressing protected with water repellent cover or cast protector. Edema Control - Lymphedema / Segmental Compressive Device / Other: Optional: One layer of unna paste to top of compression wrap (to act as an anchor). 3 Layer Compression System for Lymphedema. Elevate, Exercise Daily and Avoid Standing for Long Periods of Time. Elevate legs to the level of the heart and pump ankles as often as possible Elevate leg(s) parallel to the floor when sitting. Additional Orders / Instructions: Wound #1 Right,Posterior Lower Leg: OK to return to work with the following restrictions: - 2-3 days a week, 5 hours a day WOUND #1: - Lower Leg Wound Laterality: Right, Posterior Cleanser: Normal Saline (Generic) 1 x  Per Week/30 Days Discharge Instructions: Wash your hands with soap and water. Remove old dressing, discard into plastic bag and place into trash. Cleanse the wound with Normal Saline prior to applying a clean dressing using gauze sponges, not tissues or cotton balls. Do not scrub or use excessive force. Pat dry using gauze sponges, not tissue or cotton balls. Cleanser: Soap and Water 1 x Per Week/30 Days Discharge Instructions: Gently cleanse wound with antibacterial soap, rinse and pat dry prior to dressing wounds Primary Dressing: Hydrofera Blue Ready Transfer Foam, 2.5x2.5 (in/in) (Generic) 1 x Per Week/30 Days Discharge Instructions: Apply Hydrofera Blue Ready to fit wound bed Secondary Dressing: ABD Pad 5x9 (in/in) (Generic) 1 x Per Week/30 Days Discharge Instructions: Cover with ABD pad Compression Wrap: Profore Lite LF 3 Multilayer Compression Bandaging System (Generic) 1 x Per Week/30 Days Discharge Instructions: Apply 3 multi-layer wrap as prescribed. 1. Would recommend again we continue with the [facility] dressing that seems to be doing excellent for him. 2. We will also continue with the XtraSorb to cover which I think is also doing well. 3. I am also can recommend that the patient continue with the 3 layer compression wrap that seems to be doing a great job to control his edema. 4. I would also recommend that the patient can go back to work for 2-3 times a week working 5 hours a day which is his typical shifts I do not see any problem with this. We will see patient back for reevaluation in 1 week here in the clinic. If anything worsens or changes patient will contact our office for additional recommendations. Electronic Signature(s) Signed: 06/25/2020 3:17:57 PM By: Lenda Kelp PA-C Entered By: Lenda Kelp on 06/25/2020 15:17:57 Jeff Rios ([id number]) -------------------------------------------------------------------------------- SuperBill Details Patient  Name: Jeff Rios Date of Service: 06/25/2020 Medical Record Number: [id number] Patient Account Number: [id number] Date of Birth/Sex: Mar 02, 1966 (54 y.o. M) Treating RN: Rogers Blocker Primary Care Provider: PATIENT, NO Other Clinician: Referring Provider: Cam Hai Treating Provider/Extender: Rowan Blase in Treatment: 2 Diagnosis Coding ICD-10 Codes Code Description [phone] Unspecified open wound, right lower leg, initial encounter  A25.053 Non-pressure chronic ulcer of other part of right lower leg with fat layer exposed Z79.01 Long term (current) use of anticoagulants I25.10 Atherosclerotic heart disease of native coronary artery without angina pectoris Facility Procedures CPT4 Code: [id number] Description: (Facility Use Only) [phone] - APPLY MULTLAY COMPRS LWR RT LEG Modifier: Quantity: 1 Physician Procedures CPT4 Code: [id number] Description: 99213 - WC PHYS LEVEL 3 - EST PT Modifier: Quantity: 1 CPT4 Code: Description: ICD-10 Diagnosis Description S81.801A Unspecified open wound, right lower leg, initial encounter L97.812 Non-pressure chronic ulcer of other part of right lower leg with fat la Z79.01 Long term (current) use of anticoagulants I25.10  Atherosclerotic heart disease of native coronary artery without angina Modifier: yer exposed pectoris Quantity: Electronic Signature(s) Signed: 06/25/2020 3:18:09 PM By: Lenda Kelp PA-C Entered By: Lenda Kelp on 06/25/2020 15:18:09

## 2020-06-26 ENCOUNTER — Ambulatory Visit: Payer: Medicare Other | Admitting: Physician Assistant

## 2020-06-27 NOTE — Progress Notes (Signed)
Jeff Rios, Christoper ([id number]) Visit Report for 06/25/2020 Arrival Information Details Patient Name: Jeff Rios, Curvin Date of Service: 06/25/2020 1:30 PM Medical Record Number: [id number] Patient Account Number: [id number] Date of Birth/Sex: 1965/11/30 (55 y.o. M) Treating RN: Rogers BlockerSanchez, Kenia Primary Care Lucille Witts: PATIENT, NO Other Clinician: Referring Cerena Baine: Cam Haiarr, Jacob Treating Harlynn Kimbell/Extender: Rowan BlaseStone, Hoyt Weeks in Treatment: 2 Visit Information History Since Last Visit Pain Present Now: No Patient Arrived: Ambulatory Arrival Time: 13:24 Accompanied By: self Transfer Assistance: None Patient Identification Verified: Yes Secondary Verification Process Completed: Yes Patient Has Alerts: Yes Patient Alerts: Patient on Blood Thinner PLAVIX Electronic Signature(s) Signed: 06/25/2020 5:02:46 PM By: Phillis HaggisSanchez Pereyda, Dondra PraderKenia RN Entered By: Phillis HaggisSanchez Pereyda, Dondra PraderKenia on 06/25/2020 13:24:10 Jeff Rios, Chou ([id number]) -------------------------------------------------------------------------------- Clinic Level of Care Assessment Details Patient Name: Jeff Rios, Dravon Date of Service: 06/25/2020 1:30 PM Medical Record Number: [id number] Patient Account Number: [id number] Date of Birth/Sex: 1965/11/30 (55 y.o. M) Treating RN: Rogers BlockerSanchez, Kenia Primary Care Wake Conlee: PATIENT, NO Other Clinician: Referring Deshay Blumenfeld: Cam Haiarr, Jacob Treating Lycia Sachdeva/Extender: Rowan BlaseStone, Hoyt Weeks in Treatment: 2 Clinic Level of Care Assessment Items TOOL 1 Quantity Score []  - Use when EandM and Procedure is performed on INITIAL visit 0 ASSESSMENTS - Nursing Assessment / Reassessment []  - General Physical Exam (combine w/ comprehensive assessment (listed just below) when performed on new 0 pt. evals) []  - 0 Comprehensive Assessment (HX, ROS, Risk Assessments, Wounds Hx, etc.) ASSESSMENTS - Wound and Skin Assessment / Reassessment []  - Dermatologic / Skin Assessment (not related to wound area) 0 ASSESSMENTS -  Ostomy and/or Continence Assessment and Care []  - Incontinence Assessment and Management 0 []  - 0 Ostomy Care Assessment and Management (repouching, etc.) PROCESS - Coordination of Care []  - Simple Patient / Family Education for ongoing care 0 []  - 0 Complex (extensive) Patient / Family Education for ongoing care []  - 0 Staff obtains ChiropractorConsents, Records, Test Results / Process Orders []  - 0 Staff telephones HHA, Nursing Homes / Clarify orders / etc []  - 0 Routine Transfer to another Facility (non-emergent condition) []  - 0 Routine Hospital Admission (non-emergent condition) []  - 0 New Admissions / Manufacturing engineernsurance Authorizations / Ordering NPWT, Apligraf, etc. []  - 0 Emergency Hospital Admission (emergent condition) PROCESS - Special Needs []  - Pediatric / Minor Patient Management 0 []  - 0 Isolation Patient Management []  - 0 Hearing / Language / Visual special needs []  - 0 Assessment of Community assistance (transportation, D/C planning, etc.) []  - 0 Additional assistance / Altered mentation []  - 0 Support Surface(s) Assessment (bed, cushion, seat, etc.) INTERVENTIONS - Miscellaneous []  - External ear exam 0 []  - 0 Patient Transfer (multiple staff / Nurse, adultHoyer Lift / Similar devices) []  - 0 Simple Staple / Suture removal (25 or less) []  - 0 Complex Staple / Suture removal (26 or more) []  - 0 Hypo/Hyperglycemic Management (do not check if billed separately) []  - 0 Ankle / Brachial Index (ABI) - do not check if billed separately Has the patient been seen at the hospital within the last three years: Yes Total Score: 0 Level Of Care: ____ Jeff Rios, Unnamed ([id number]) Electronic Signature(s) Signed: 06/25/2020 5:02:46 PM By: Phillis HaggisSanchez Pereyda, Dondra PraderKenia RN Entered By: Phillis HaggisSanchez Pereyda, Dondra PraderKenia on 06/25/2020 13:39:53 Jeff Rios, Drexler ([id number]) -------------------------------------------------------------------------------- Compression Therapy Details Patient Name: Jeff Rios,  Zeev Date of Service: 06/25/2020 1:30 PM Medical Record Number: [id number] Patient Account Number: [id number] Date of Birth/Sex: 1965/11/30 (54 y.o. M) Treating RN: Rogers BlockerSanchez, Kenia Primary Care Nicholi Ghuman: PATIENT, NO Other Clinician: Referring Seriah Brotzman: Cam Haiarr, Jacob Treating Zissel Biederman/Extender: Rowan BlaseStone, Hoyt Weeks in Treatment:  2 Compression Therapy Performed for Wound Assessment: Wound #1 Right,Posterior Lower Leg Performed By: Ovidio Hanger, RN Compression Type: Three Layer Pre Treatment ABI: 1.2 Post Procedure Diagnosis Same as Pre-procedure Electronic Signature(s) Signed: 06/25/2020 5:02:46 PM By: Phillis Haggis, Dondra Prader RN Entered By: Phillis Haggis, Dondra Prader on 06/25/2020 13:37:52 Jeff Ricks ([id number]) -------------------------------------------------------------------------------- Encounter Discharge Information Details Patient Name: Jeff Ricks Date of Service: 06/25/2020 1:30 PM Medical Record Number: [id number] Patient Account Number: [id number] Date of Birth/Sex: November 04, 1965 (54 y.o. M) Treating RN: Rogers Blocker Primary Care Kerianna Rawlinson: PATIENT, NO Other Clinician: Referring Lawerance Matsuo: Cam Hai Treating Grover Robinson/Extender: Rowan Blase in Treatment: 2 Encounter Discharge Information Items Discharge Condition: Stable Ambulatory Status: Ambulatory Discharge Destination: Home Transportation: Private Auto Accompanied By: case manager Schedule Follow-up Appointment: Yes Clinical Summary of Care: Patient Declined Electronic Signature(s) Signed: 06/27/2020 11:27:08 AM By: Yevonne Pax RN Entered By: Yevonne Pax on 06/25/2020 13:53:26 Jeff Ricks ([id number]) -------------------------------------------------------------------------------- Lower Extremity Assessment Details Patient Name: Jeff Ricks Date of Service: 06/25/2020 1:30 PM Medical Record Number: [id number] Patient Account Number: [id number] Date of Birth/Sex: Dec 11, 1965 (55 y.o.  M) Treating RN: Rogers Blocker Primary Care Kalmen Lollar: PATIENT, NO Other Clinician: Referring Dargan Carmack: Cam Hai Treating Mamta Rimmer/Extender: Rowan Blase in Treatment: 2 Edema Assessment Assessed: [Left: No] [Right: Yes] Edema: [Left: N] [Right: o] Calf Left: Right: Point of Measurement: 30 cm From Medial Instep 33 cm Ankle Left: Right: Point of Measurement: 10 cm From Medial Instep 23.5 cm Vascular Assessment Pulses: Dorsalis Pedis Palpable: [Right:Yes] Electronic Signature(s) Signed: 06/25/2020 5:02:46 PM By: Phillis Haggis, Dondra Prader RN Entered By: Phillis Haggis, Dondra Prader on 06/25/2020 13:35:01 Jeff Ricks ([id number]) -------------------------------------------------------------------------------- Multi Wound Chart Details Patient Name: Jeff Ricks Date of Service: 06/25/2020 1:30 PM Medical Record Number: [id number] Patient Account Number: [id number] Date of Birth/Sex: [birth year] (55 y.o. M) Treating RN: Rogers Blocker Primary Care Nayeliz Hipp: PATIENT, NO Other Clinician: Referring Theron Cumbie: Cam Hai Treating Emani Morad/Extender: Rowan Blase in Treatment: 2 Vital Signs Height(in): 63 Pulse(bpm): 68 Weight(lbs): 182 Blood Pressure(mmHg): 128/86 Body Mass Index(BMI): 32 Temperature(F): 97.8 Respiratory Rate(breaths/min): 18 Photos: [N/A:N/A] Wound Location: Right, Posterior Lower Leg N/A N/A Wounding Event: Trauma N/A N/A Primary Etiology: Trauma, Other N/A N/A Comorbid History: Coronary Artery Disease, N/A N/A Hypertension, Myocardial Infarction Date Acquired: 05/01/2020 N/A N/A Weeks of Treatment: 2 N/A N/A Wound Status: Open N/A N/A Measurements L x W x D (cm) 3.2x1.5x0.4 N/A N/A Area (cm) : 3.77 N/A N/A Volume (cm) : 1.508 N/A N/A % Reduction in Area: 50.40% N/A N/A % Reduction in Volume: 71.70% N/A N/A Classification: Full Thickness Without Exposed N/A N/A Support Structures Exudate Amount: Medium N/A N/A Exudate Type:  Serosanguineous N/A N/A Exudate Color: red, brown N/A N/A Wound Margin: Flat and Intact N/A N/A Granulation Amount: Large (67-100%) N/A N/A Granulation Quality: Red, Hyper-granulation N/A N/A Necrotic Amount: None Present (0%) N/A N/A Exposed Structures: Fat Layer (Subcutaneous Tissue): N/A N/A Yes Fascia: No Tendon: No Muscle: No Joint: No Bone: No Epithelialization: Small (1-33%) N/A N/A Treatment Notes Electronic Signature(s) Signed: 06/25/2020 5:02:46 PM By: Phillis Haggis, Dondra Prader RN Entered By: Phillis Haggis, Dondra Prader on 06/25/2020 13:37:35 Jeff Ricks ([id number]) -------------------------------------------------------------------------------- Multi-Disciplinary Care Plan Details Patient Name: Jeff Ricks Date of Service: 06/25/2020 1:30 PM Medical Record Number: [id number] Patient Account Number: [id number] Date of Birth/Sex: 04/21/1966 (54 y.o. M) Treating RN: Rogers Blocker Primary Care Gianno Volner: PATIENT, NO Other Clinician: Referring Chen Saadeh: Cam Hai Treating Victoriya Pol/Extender: Rowan Blase in Treatment: 2 Active Inactive Wound/Skin Impairment Nursing Diagnoses: Knowledge deficit related to ulceration/compromised skin integrity  Goals: Patient/caregiver will verbalize understanding of skin care regimen Date Initiated: 06/10/2020 Target Resolution Date: 07/08/2020 Goal Status: Active Ulcer/skin breakdown will have a volume reduction of 30% by week 4 Date Initiated: 06/10/2020 Target Resolution Date: 07/08/2020 Goal Status: Active Ulcer/skin breakdown will have a volume reduction of 50% by week 8 Date Initiated: 06/10/2020 Target Resolution Date: 08/08/2020 Goal Status: Active Ulcer/skin breakdown will have a volume reduction of 80% by week 12 Date Initiated: 06/10/2020 Target Resolution Date: 09/07/2020 Goal Status: Active Ulcer/skin breakdown will heal within 14 weeks Date Initiated: 06/10/2020 Target Resolution Date: 10/08/2020 Goal Status:  Active Interventions: Assess patient/caregiver ability to obtain necessary supplies Assess patient/caregiver ability to perform ulcer/skin care regimen upon admission and as needed Assess ulceration(s) every visit Notes: Electronic Signature(s) Signed: 06/25/2020 5:02:46 PM By: Phillis Haggis, Dondra Prader RN Entered By: Phillis Haggis, Dondra Prader on 06/25/2020 13:37:24 Jeff Ricks ([id number]) -------------------------------------------------------------------------------- Pain Assessment Details Patient Name: Jeff Ricks Date of Service: 06/25/2020 1:30 PM Medical Record Number: [id number] Patient Account Number: [id number] Date of Birth/Sex: Dec 17, 1965 (55 y.o. M) Treating RN: Rogers Blocker Primary Care Delia Sitar: PATIENT, NO Other Clinician: Referring Juvenal Umar: Cam Hai Treating Anguel Delapena/Extender: Rowan Blase in Treatment: 2 Active Problems Location of Pain Severity and Description of Pain Patient Has Paino No Site Locations Rate the pain. Current Pain Level: 0 Pain Management and Medication Current Pain Management: Electronic Signature(s) Signed: 06/25/2020 5:02:46 PM By: Phillis Haggis, Dondra Prader RN Entered By: Phillis Haggis, Dondra Prader on 06/25/2020 13:26:36 Jeff Ricks ([id number]) -------------------------------------------------------------------------------- Patient/Caregiver Education Details Patient Name: Jeff Ricks Date of Service: 06/25/2020 1:30 PM Medical Record Number: [id number] Patient Account Number: [id number] Date of Birth/Gender: 04/10/1966 (56 y.o. M) Treating RN: Rogers Blocker Primary Care Physician: PATIENT, NO Other Clinician: Referring Physician: Cam Hai Treating Physician/Extender: Rowan Blase in Treatment: 2 Education Assessment Education Provided To: Patient Education Topics Provided Wound/Skin Impairment: Methods: Explain/Verbal Responses: State content correctly Electronic Signature(s) Signed: 06/25/2020  5:02:46 PM By: Phillis Haggis, Dondra Prader RN Entered By: Phillis Haggis, Dondra Prader on 06/25/2020 13:40:10 Jeff Ricks ([id number]) -------------------------------------------------------------------------------- Wound Assessment Details Patient Name: Jeff Ricks Date of Service: 06/25/2020 1:30 PM Medical Record Number: [id number] Patient Account Number: [id number] Date of Birth/Sex: 1965-11-07 (54 y.o. M) Treating RN: Rogers Blocker Primary Care Catalena Stanhope: PATIENT, NO Other Clinician: Referring Krystalyn Kubota: Cam Hai Treating Nerea Bordenave/Extender: Rowan Blase in Treatment: 2 Wound Status Wound Number: 1 Primary Trauma, Other Etiology: Wound Location: Right, Posterior Lower Leg Wound Status: Open Wounding Event: Trauma Comorbid Coronary Artery Disease, Hypertension, Myocardial Date Acquired: 05/01/2020 History: Infarction Weeks Of Treatment: 2 Clustered Wound: No Photos Wound Measurements Length: (cm) 3.2 Width: (cm) 1.5 Depth: (cm) 0.4 Area: (cm) 3.77 Volume: (cm) 1.508 % Reduction in Area: 50.4% % Reduction in Volume: 71.7% Epithelialization: Small (1-33%) Tunneling: No Undermining: No Wound Description Classification: Full Thickness Without Exposed Support Structures Wound Margin: Flat and Intact Exudate Amount: Medium Exudate Type: Serosanguineous Exudate Color: red, brown Foul Odor After Cleansing: No Slough/Fibrino No Wound Bed Granulation Amount: Large (67-100%) Exposed Structure Granulation Quality: Red, Hyper-granulation Fascia Exposed: No Necrotic Amount: None Present (0%) Fat Layer (Subcutaneous Tissue) Exposed: Yes Tendon Exposed: No Muscle Exposed: No Joint Exposed: No Bone Exposed: No Treatment Notes Wound #1 (Lower Leg) Wound Laterality: Right, Posterior Cleanser Normal Saline Discharge Instruction: Wash your hands with soap and water. Remove old dressing, discard into plastic bag and place into trash. Cleanse the wound with Normal  Saline prior to applying a clean dressing using gauze sponges, not tissues or cotton balls. Do not scrub or  use excessive force. Pat dry using gauze sponges, not tissue or cotton balls. LEVONTE, MOLINA ([id number]) Soap and Water Discharge Instruction: Gently cleanse wound with antibacterial soap, rinse and pat dry prior to dressing wounds Peri-Wound Care Topical Primary Dressing Hydrofera Blue Ready Transfer Foam, 2.5x2.5 (in/in) Discharge Instruction: Apply Hydrofera Blue Ready to fit wound bed Secondary Dressing ABD Pad 5x9 (in/in) Discharge Instruction: Cover with ABD pad Secured With Compression Wrap Profore Lite LF 3 Multilayer Compression Bandaging System Discharge Instruction: Apply 3 multi-layer wrap as prescribed. Compression Stockings Add-Ons Electronic Signature(s) Signed: 06/25/2020 5:02:46 PM By: Phillis Haggis, Dondra Prader RN Entered By: Phillis Haggis, Dondra Prader on 06/25/2020 13:33:48 Jeff Ricks ([id number]) -------------------------------------------------------------------------------- Vitals Details Patient Name: Jeff Ricks Date of Service: 06/25/2020 1:30 PM Medical Record Number: [id number] Patient Account Number: [id number] Date of Birth/Sex: 1965/09/09 (54 y.o. M) Treating RN: Rogers Blocker Primary Care Jadian Karman: PATIENT, NO Other Clinician: Referring Leilani Cespedes: Cam Hai Treating Decker Cogdell/Extender: Rowan Blase in Treatment: 2 Vital Signs Time Taken: 13:24 Temperature (F): 97.8 Height (in): 63 Pulse (bpm): 68 Weight (lbs): 182 Respiratory Rate (breaths/min): 18 Body Mass Index (BMI): 32.2 Blood Pressure (mmHg): 128/86 Reference Range: 80 - 120 mg / dl Electronic Signature(s) Signed: 06/25/2020 5:02:46 PM By: Phillis Haggis, Dondra Prader RN Entered By: Phillis Haggis, Dondra Prader on 06/25/2020 13:26:09

## 2020-06-28 ENCOUNTER — Other Ambulatory Visit: Payer: Self-pay

## 2020-06-28 ENCOUNTER — Ambulatory Visit (INDEPENDENT_AMBULATORY_CARE_PROVIDER_SITE_OTHER): Payer: Medicare Other

## 2020-06-28 DIAGNOSIS — R079 Chest pain, unspecified: Secondary | ICD-10-CM | POA: Diagnosis not present

## 2020-06-28 DIAGNOSIS — R0600 Dyspnea, unspecified: Secondary | ICD-10-CM

## 2020-06-28 DIAGNOSIS — R0609 Other forms of dyspnea: Secondary | ICD-10-CM

## 2020-06-28 LAB — ECHOCARDIOGRAM COMPLETE
AR max vel: 2.87 cm2
AV Area VTI: 3.34 cm2
AV Area mean vel: 2.74 cm2
AV Mean grad: 3 mmHg
AV Peak grad: 5.3 mmHg
Ao pk vel: 1.15 m/s
Area-P 1/2: 4.57 cm2
Calc EF: 57 %
S' Lateral: 1.9 cm
Single Plane A2C EF: 55.1 %
Single Plane A4C EF: 59 %

## 2020-07-02 ENCOUNTER — Encounter: Payer: Medicare Other | Attending: Physician Assistant | Admitting: Physician Assistant

## 2020-07-02 ENCOUNTER — Other Ambulatory Visit: Payer: Self-pay

## 2020-07-02 DIAGNOSIS — Z7901 Long term (current) use of anticoagulants: Secondary | ICD-10-CM | POA: Diagnosis not present

## 2020-07-02 DIAGNOSIS — I252 Old myocardial infarction: Secondary | ICD-10-CM | POA: Insufficient documentation

## 2020-07-02 DIAGNOSIS — I2510 Atherosclerotic heart disease of native coronary artery without angina pectoris: Secondary | ICD-10-CM | POA: Insufficient documentation

## 2020-07-02 DIAGNOSIS — L97812 Non-pressure chronic ulcer of other part of right lower leg with fat layer exposed: Secondary | ICD-10-CM | POA: Insufficient documentation

## 2020-07-02 DIAGNOSIS — X58XXXA Exposure to other specified factors, initial encounter: Secondary | ICD-10-CM | POA: Diagnosis not present

## 2020-07-02 DIAGNOSIS — S81801A Unspecified open wound, right lower leg, initial encounter: Secondary | ICD-10-CM | POA: Diagnosis present

## 2020-07-02 NOTE — Progress Notes (Addendum)
JUVENAL, UMAR ([id number]) Visit Report for 07/02/2020 Chief Complaint Document Details Patient Name: Jeff Rios, Jeff Rios Date of Service: 07/02/2020 10:30 AM Medical Record Number: [id number] Patient Account Number: [id number] Date of Birth/Sex: 05-30-1965 (55 y.o. M) Treating RN: Rogers Blocker Primary Care Provider: PATIENT, NO Other Clinician: Referring Provider: Cam Hai Treating Provider/Extender: Rowan Blase in Treatment: 3 Information Obtained from: Patient Chief Complaint Right LE Electronic Signature(s) Signed: 07/02/2020 11:10:30 AM By: Lenda Kelp PA-C Entered By: Lenda Kelp on 07/02/2020 11:10:30 Jeff Rios ([id number]) -------------------------------------------------------------------------------- HPI Details Patient Name: Jeff Rios Date of Service: 07/02/2020 10:30 AM Medical Record Number: [id number] Patient Account Number: [id number] Date of Birth/Sex: [birth year] (54 y.o. M) Treating RN: Rogers Blocker Primary Care Provider: PATIENT, NO Other Clinician: Referring Provider: Cam Hai Treating Provider/Extender: Rowan Blase in Treatment: 3 History of Present Illness HPI Description: 06/10/2020 upon evaluation today patient presents for initial evaluation here in our clinic concerning issue with a wound which began on 29 December. Subsequently the patient tells me that this is a work comp related injury that he was working at [employer] when one of the bundles of cardboard boxes that they compile unfortunately fell off of a what sounds to be dolly struck him in the back of the leg. He initially had a small laceration. Unfortunately this developed into a much bigger issue as noted today where there is necrotic tissue in the base of the wound as well. There is a lot of swelling around the edges of the wound as well. It obviously seems like this turned into a hematoma which is not farfetched considering the fact that the patient is on  long-term anticoagulant therapy secondary to his cardiac history. He does have a history of myocardial infarction even at such a young age. He is on Eliquis I do believe at this point. Subsequently the patient states that he does have some discomfort here is not excruciating but nonetheless it is problematic for him. 06/17/2020 upon evaluation today patient appears to be doing better in regard to his wound on the posterior aspect of his right leg. He has been tolerating the dressing changes without complication even in 1 week's time I do see a significant improvement here which is great news. There does not appear to be any evidence of active infection at this point which is excellent. Overall I am extremely pleased with how things seem to be progressing. 06/25/2020 upon evaluation today patient appears to be doing well with regard to his wound. This is measuring smaller and looks to be doing much better I do think the [facility] is doing a good job. 07/02/2020 upon evaluation today patient appears to be making excellent progress at this point. Fortunately there is no sign of active infection which is great news and in general I am extremely pleased with where things stand today. No fevers, chills, nausea, vomiting, or diarrhea. Electronic Signature(s) Signed: 07/02/2020 1:13:09 PM By: Lenda Kelp PA-C Entered By: Lenda Kelp on 07/02/2020 13:13:09 Jeff Rios ([id number]) -------------------------------------------------------------------------------- Physical Exam Details Patient Name: Jeff Rios Date of Service: 07/02/2020 10:30 AM Medical Record Number: [id number] Patient Account Number: [id number] Date of Birth/Sex: 02/09/1966 (54 y.o. M) Treating RN: Rogers Blocker Primary Care Provider: PATIENT, NO Other Clinician: Referring Provider: Cam Hai Treating Provider/Extender: Rowan Blase in Treatment: 3 Constitutional Well-nourished and well-hydrated in no acute  distress. Respiratory normal breathing without difficulty. Psychiatric this patient is able to make decisions and demonstrates good insight into disease process. Alert  and Oriented x 3. pleasant and cooperative. Notes Upon inspection patient's wound bed actually showed signs of good granulation epithelization at this point. There does not appear to be any evidence of active infection which is great news and overall I think he is doing quite well. Electronic Signature(s) Signed: 07/02/2020 1:13:27 PM By: Lenda Kelp PA-C Entered By: Lenda Kelp on 07/02/2020 13:13:27 Jeff Rios ([id number]) -------------------------------------------------------------------------------- Physician Orders Details Patient Name: Jeff Rios Date of Service: 07/02/2020 10:30 AM Medical Record Number: [id number] Patient Account Number: [id number] Date of Birth/Sex: 03-Oct-1965 (54 y.o. M) Treating RN: Rogers Blocker Primary Care Provider: PATIENT, NO Other Clinician: Referring Provider: Cam Hai Treating Provider/Extender: Rowan Blase in Treatment: 3 Verbal / Phone Orders: No Diagnosis Coding ICD-10 Coding Code Description S81.801A Unspecified open wound, right lower leg, initial encounter L97.812 Non-pressure chronic ulcer of other part of right lower leg with fat layer exposed Z79.01 Long term (current) use of anticoagulants I25.10 Atherosclerotic heart disease of native coronary artery without angina pectoris Follow-up Appointments Wound #1 Right,Posterior Lower Leg o Return Appointment in 1 week. Bathing/ Shower/ Hygiene Wound #1 Right,Posterior Lower Leg o May shower with wound dressing protected with water repellent cover or cast protector. Edema Control - Lymphedema / Segmental Compressive Device / Other Right Lower Extremity o Optional: One layer of unna paste to top of compression wrap (to act as an anchor). o 3 Layer Compression System for Lymphedema. o  Elevate, Exercise Daily and Avoid Standing for Long Periods of Time. o Elevate legs to the level of the heart and pump ankles as often as possible o Elevate leg(s) parallel to the floor when sitting. Additional Orders / Instructions Wound #1 Right,Posterior Lower Leg o OK to return to work with the following restrictions: - 2-3 days a week, 5 hours a day Wound Treatment Wound #1 - Lower Leg Wound Laterality: Right, Posterior Cleanser: Normal Saline (Generic) 1 x Per Week/30 Days Discharge Instructions: Wash your hands with soap and water. Remove old dressing, discard into plastic bag and place into trash. Cleanse the wound with Normal Saline prior to applying a clean dressing using gauze sponges, not tissues or cotton balls. Do not scrub or use excessive force. Pat dry using gauze sponges, not tissue or cotton balls. Cleanser: Soap and Water 1 x Per Week/30 Days Discharge Instructions: Gently cleanse wound with antibacterial soap, rinse and pat dry prior to dressing wounds Primary Dressing: Hydrofera Blue Ready Transfer Foam, 2.5x2.5 (in/in) (Generic) 1 x Per Week/30 Days Discharge Instructions: Apply Hydrofera Blue Ready to fit wound bed Secondary Dressing: ABD Pad 5x9 (in/in) (Generic) 1 x Per Week/30 Days Discharge Instructions: Cover with ABD pad Compression Wrap: Profore Lite LF 3 Multilayer Compression Bandaging System (Generic) 1 x Per Week/30 Days Discharge Instructions: Apply 3 multi-layer wrap as prescribed. Electronic Signature(s) Signed: 07/02/2020 4:50:13 PM By: Lenda Kelp PA-C Signed: 07/02/2020 5:03:53 PM By: Phillis Haggis, Dondra Prader RN [place], [place] ([id number]) Entered By: Phillis Haggis, Dondra Prader on 07/02/2020 11:45:38 Jeff Rios ([id number]) -------------------------------------------------------------------------------- Problem List Details Patient Name: Jeff Rios Date of Service: 07/02/2020 10:30 AM Medical Record Number: [id number] Patient  Account Number: [id number] Date of Birth/Sex: 1966-02-26 (55 y.o. M) Treating RN: Rogers Blocker Primary Care Provider: PATIENT, NO Other Clinician: Referring Provider: Cam Hai Treating Provider/Extender: Rowan Blase in Treatment: 3 Active Problems ICD-10 Encounter Code Description Active Date MDM Diagnosis S81.801A Unspecified open wound, right lower leg, initial encounter 06/10/2020 No Yes L97.812 Non-pressure chronic ulcer of other part of right lower  leg with fat layer 06/10/2020 No Yes exposed Z79.01 Long term (current) use of anticoagulants 06/10/2020 No Yes I25.10 Atherosclerotic heart disease of native coronary artery without angina 06/10/2020 No Yes pectoris Inactive Problems Resolved Problems Electronic Signature(s) Signed: 07/02/2020 11:10:22 AM By: Lenda KelpStone III, Silveria Botz PA-C Entered By: Lenda KelpStone III, Mahoganie Basher on 07/02/2020 11:10:21 Jeff RicksWATLINGTON, Jeff Rios ([id number]) -------------------------------------------------------------------------------- Progress Note Details Patient Name: Jeff RicksWATLINGTON, Jeff Rios Date of Service: 07/02/2020 10:30 AM Medical Record Number: [id number] Patient Account Number: [id number] Date of Birth/Sex: 22-Aug-1965 (54 y.o. M) Treating RN: Rogers BlockerSanchez, Kenia Primary Care Provider: PATIENT, NO Other Clinician: Referring Provider: Cam Haiarr, Jacob Treating Provider/Extender: Rowan BlaseStone, Akeira Lahm Weeks in Treatment: 3 Subjective Chief Complaint Information obtained from Patient Right LE History of Present Illness (HPI) 06/10/2020 upon evaluation today patient presents for initial evaluation here in our clinic concerning issue with a wound which began on 29 December. Subsequently the patient tells me that this is a work comp related injury that he was working at [employer] when one of the bundles of cardboard boxes that they compile unfortunately fell off of a what sounds to be dolly struck him in the back of the leg. He initially had a small laceration. Unfortunately this developed into a  much bigger issue as noted today where there is necrotic tissue in the base of the wound as well. There is a lot of swelling around the edges of the wound as well. It obviously seems like this turned into a hematoma which is not farfetched considering the fact that the patient is on long-term anticoagulant therapy secondary to his cardiac history. He does have a history of myocardial infarction even at such a young age. He is on Eliquis I do believe at this point. Subsequently the patient states that he does have some discomfort here is not excruciating but nonetheless it is problematic for him. 06/17/2020 upon evaluation today patient appears to be doing better in regard to his wound on the posterior aspect of his right leg. He has been tolerating the dressing changes without complication even in 1 week's time I do see a significant improvement here which is great news. There does not appear to be any evidence of active infection at this point which is excellent. Overall I am extremely pleased with how things seem to be progressing. 06/25/2020 upon evaluation today patient appears to be doing well with regard to his wound. This is measuring smaller and looks to be doing much better I do think the [facility] doing [facility] good [facility]. 07/02/2020 upon evaluation today patient appears to be making excellent progress at this point. Fortunately there is no sign of active infection which is great news and in general I am extremely pleased with where things stand today. No fevers, chills, nausea, vomiting, or diarrhea. Objective Constitutional Well-nourished and well-hydrated in no acute distress. Vitals Time Taken: 10:49 AM, Height: 63 in, Weight: 182 lbs, BMI: 32.2, Temperature: 98.1 F, Pulse: 77 bpm, Respiratory Rate: 16 breaths/min, Blood Pressure: 125/77 mmHg. Respiratory normal breathing without difficulty. Psychiatric this patient is able to make decisions and demonstrates good insight into disease  process. Alert and Oriented x 3. pleasant and cooperative. General Notes: Upon inspection patient's wound bed actually showed signs of good granulation epithelization at this point. There does not appear to be any evidence of active infection which is great news and overall I think he is doing quite well. Integumentary (Hair, Skin) Wound #1 status is Open. Original cause of wound was Trauma. The date acquired was: 05/01/2020. The  wound has been in treatment 3 weeks. The wound is located on the Right,Posterior Lower Leg. The wound measures 2.4cm length x 0.7cm width x 0.3cm depth; 1.319cm^2 area and 0.396cm^3 volume. There is Fat Layer (Subcutaneous Tissue) exposed. There is no tunneling or undermining noted. There is a medium amount of serosanguineous drainage noted. The wound margin is flat and intact. There is large (67-100%) red, hyper - granulation within the wound bed. There is no necrotic tissue within the wound bed. Jeff Rios, Jeff Rios ([id number]) Assessment Active Problems ICD-10 Unspecified open wound, right lower leg, initial encounter Non-pressure chronic ulcer of other part of right lower leg with fat layer exposed Long term (current) use of anticoagulants Atherosclerotic heart disease of native coronary artery without angina pectoris Procedures Wound #1 Pre-procedure diagnosis of Wound #1 is a Trauma, Other located on the Right,Posterior Lower Leg . There was a Three Layer Compression Therapy Procedure with a pre-treatment ABI of 1.2 by Rogers Blocker, RN. Post procedure Diagnosis Wound #1: Same as Pre-Procedure Plan Follow-up Appointments: Wound #1 Right,Posterior Lower Leg: Return Appointment in 1 week. Bathing/ Shower/ Hygiene: Wound #1 Right,Posterior Lower Leg: May shower with wound dressing protected with water repellent cover or cast protector. Edema Control - Lymphedema / Segmental Compressive Device / Other: Optional: One layer of unna paste to top of compression  wrap (to act as an anchor). 3 Layer Compression System for Lymphedema. Elevate, Exercise Daily and Avoid Standing for Long Periods of Time. Elevate legs to the level of the heart and pump ankles as often as possible Elevate leg(s) parallel to the floor when sitting. Additional Orders / Instructions: Wound #1 Right,Posterior Lower Leg: OK to return to work with the following restrictions: - 2-3 days a week, 5 hours a day WOUND #1: - Lower Leg Wound Laterality: Right, Posterior Cleanser: Normal Saline (Generic) 1 x Per Week/30 Days Discharge Instructions: Wash your hands with soap and water. Remove old dressing, discard into plastic bag and place into trash. Cleanse the wound with Normal Saline prior to applying a clean dressing using gauze sponges, not tissues or cotton balls. Do not scrub or use excessive force. Pat dry using gauze sponges, not tissue or cotton balls. Cleanser: Soap and Water 1 x Per Week/30 Days Discharge Instructions: Gently cleanse wound with antibacterial soap, rinse and pat dry prior to dressing wounds Primary Dressing: Hydrofera Blue Ready Transfer Foam, 2.5x2.5 (in/in) (Generic) 1 x Per Week/30 Days Discharge Instructions: Apply Hydrofera Blue Ready to fit wound bed Secondary Dressing: ABD Pad 5x9 (in/in) (Generic) 1 x Per Week/30 Days Discharge Instructions: Cover with ABD pad Compression Wrap: Profore Lite LF 3 Multilayer Compression Bandaging System (Generic) 1 x Per Week/30 Days Discharge Instructions: Apply 3 multi-layer wrap as prescribed. 1. Would recommend currently that we have the patient going continue with the work restrictions. I think that he is still fine to continue to work limited hours as he was before without any complication. We will give an updated work note today. 2. I am also can recommend that we have the patient continue with the [facility] I think that is doing a great job. 3. I am also can recommend that regarding continue with 3 layer  compression wrap which seems to be doing excellent for the patient. We will see patient back for reevaluation in 1 week here in the clinic. If anything worsens or changes patient will contact our office for additional recommendations. Electronic Signature(s) Signed: 07/02/2020 1:15:39 PM By: Lenda Kelp PA-C Entered By: Larina Bras  IIILeonard Schwartz on 07/02/2020 13:15:38 Jeff Rios, Jeff Rios ([id number]) Jeff Rios ([id number]) -------------------------------------------------------------------------------- SuperBill Details Patient Name: Jeff Rios Date of Service: 07/02/2020 Medical Record Number: [id number] Patient Account Number: [id number] Date of Birth/Sex: [birth year] (55 y.o. M) Treating RN: Rogers Blocker Primary Care Provider: PATIENT, NO Other Clinician: Referring Provider: Cam Hai Treating Provider/Extender: Rowan Blase in Treatment: 3 Diagnosis Coding ICD-10 Codes Code Description [phone] Unspecified open wound, right lower leg, initial encounter L97.812 Non-pressure chronic ulcer of other part of right lower leg with fat layer exposed Z79.01 Long term (current) use of anticoagulants I25.10 Atherosclerotic heart disease of native coronary artery without angina pectoris Facility Procedures CPT4 Code: [id number] Description: (Facility Use Only) [phone] - APPLY MULTLAY COMPRS LWR RT LEG Modifier: Quantity: 1 Physician Procedures CPT4 Code: [id number] Description: 99213 - WC PHYS LEVEL 3 - EST PT Modifier: Quantity: 1 CPT4 Code: Description: ICD-10 Diagnosis Description S81.801A Unspecified open wound, right lower leg, initial encounter L97.812 Non-pressure chronic ulcer of other part of right lower leg with fat la Z79.01 Long term (current) use of anticoagulants I25.10  Atherosclerotic heart disease of native coronary artery without angina Modifier: yer exposed pectoris Quantity: Electronic Signature(s) Signed: 07/02/2020 1:15:49 PM By: Lenda Kelp  PA-C Entered By: Lenda Kelp on 07/02/2020 13:15:49

## 2020-07-03 ENCOUNTER — Ambulatory Visit
Admission: RE | Admit: 2020-07-03 | Discharge: 2020-07-03 | Disposition: A | Discharge status: Home / Self Care | Payer: Medicare Other | Source: Ambulatory Visit | Attending: Physician Assistant | Admitting: Physician Assistant

## 2020-07-03 DIAGNOSIS — R079 Chest pain, unspecified: Secondary | ICD-10-CM | POA: Diagnosis not present

## 2020-07-03 DIAGNOSIS — R0600 Dyspnea, unspecified: Secondary | ICD-10-CM | POA: Diagnosis not present

## 2020-07-03 DIAGNOSIS — R0609 Other forms of dyspnea: Secondary | ICD-10-CM

## 2020-07-03 LAB — NM MYOCAR MULTI W/SPECT W/WALL MOTION / EF
MPHR: 168 {beats}/min
Peak HR: 97 {beats}/min
Percent HR: 57 %
Rest HR: 67 {beats}/min

## 2020-07-03 MED ORDER — TECHNETIUM TC 99M TETROFOSMIN IV KIT
10.5000 | PACK | Freq: Once | INTRAVENOUS | Status: AC | PRN
  Administered 2020-07-03: 10.5 via INTRAVENOUS

## 2020-07-03 MED ORDER — TECHNETIUM TC 99M TETROFOSMIN IV KIT
30.0000 | PACK | Freq: Once | INTRAVENOUS | Status: AC | PRN
  Administered 2020-07-03: 30.763 via INTRAVENOUS

## 2020-07-03 MED ORDER — REGADENOSON 0.4 MG/5ML IV SOLN
0.4000 mg | Freq: Once | INTRAVENOUS | Status: AC
  Administered 2020-07-03: 0.4 mg via INTRAVENOUS

## 2020-07-03 NOTE — Progress Notes (Signed)
Jeff Rios, Jeff Rios ([id number]) Visit Report for 07/02/2020 Arrival Information Details Patient Name: Jeff Rios, Jeff Rios Date of Service: 07/02/2020 10:30 AM Medical Record Number: [id number] Patient Account Number: [id number] Date of Birth/Sex: 08/22/1965 (55 y.o. M) Treating RN: Carlene Coria Primary Care Provider: PATIENT, NO Other Clinician: Referring Provider: Roland Rack Treating Provider/Extender: Skipper Cliche in Treatment: 3 Visit Information History Since Last Visit All ordered tests and consults were completed: No Patient Arrived: Ambulatory Added or deleted any medications: No Arrival Time: 10:49 Any new allergies or adverse reactions: No Accompanied By: self Had a fall or experienced change in No Transfer Assistance: None activities of daily living that may affect Patient Identification Verified: Yes risk of falls: Secondary Verification Process Completed: Yes Signs or symptoms of abuse/neglect since last visito No Patient Requires Transmission-Based No Hospitalized since last visit: No Precautions: Implantable device outside of the clinic excluding No Patient Has Alerts: Yes cellular tissue based products placed in the center Patient Alerts: Patient on Blood since last visit: Thinner Has Dressing in Place as Prescribed: Yes PLAVIX Has Compression in Place as Prescribed: Yes Pain Present Now: No Electronic Signature(s) Signed: 07/02/2020 4:52:45 PM By: Carlene Coria RN Entered By: Carlene Coria on 07/02/2020 10:49:36 Jeff Rios ([id number]) -------------------------------------------------------------------------------- Clinic Level of Care Assessment Details Patient Name: Jeff Rios Date of Service: 07/02/2020 10:30 AM Medical Record Number: [id number] Patient Account Number: [id number] Date of Birth/Sex: 1966-01-06 (55 y.o. M) Treating RN: Dolan Amen Primary Care Provider: PATIENT, NO Other Clinician: Referring Provider: Roland Rack Treating  Provider/Extender: Skipper Cliche in Treatment: 3 Clinic Level of Care Assessment Items TOOL 1 Quantity Score _0  - Use when EandM and Procedure is performed on INITIAL visit 0 ASSESSMENTS - Nursing Assessment / Reassessment _1  - General Physical Exam (combine w/ comprehensive assessment (listed just below) when performed on new 0 pt. evals) _2  - 0 Comprehensive Assessment (HX, ROS, Risk Assessments, Wounds Hx, etc.) ASSESSMENTS - Wound and Skin Assessment / Reassessment _3  - Dermatologic / Skin Assessment (not related to wound area) 0 ASSESSMENTS - Ostomy and/or Continence Assessment and Care _4  - Incontinence Assessment and Management 0 _5  - 0 Ostomy Care Assessment and Management (repouching, etc.) PROCESS - Coordination of Care _6  - Simple Patient / Family Education for ongoing care 0 _7  - 0 Complex (extensive) Patient / Family Education for ongoing care _8  - 0 Staff obtains Programmer, systems, Records, Test Results / Process Orders _9  - 0 Staff telephones HHA, Nursing Homes / Clarify orders / etc _10  - 0 Routine Transfer to another Facility (non-emergent condition) _11  - 0 Routine Hospital Admission (non-emergent condition) _12  - 0 New Admissions / Biomedical engineer / Ordering NPWT, Apligraf, etc. _13  - 0 Emergency Hospital Admission (emergent condition) PROCESS - Special Needs _14  - Pediatric / Minor Patient Management 0 _15  - 0 Isolation Patient Management _16  - 0 Hearing / Language / Visual special needs _17  - 0 Assessment of Community assistance (transportation, D/C planning, etc.) _18  - 0 Additional assistance / Altered mentation _19  - 0 Support Surface(s) Assessment (bed, cushion, seat, etc.) INTERVENTIONS - Miscellaneous _20  - External ear exam 0 _21  - 0 Patient Transfer (multiple staff / Civil Service fast streamer / Similar devices) _22  - 0 Simple Staple / Suture removal (25 or less) _23  - 0 Complex Staple / Suture removal (26 or more) _24  - 0 Hypo/Hyperglycemic Management (do not  check if billed separately) _25  - 0 Ankle / Brachial Index (ABI) - do not check if billed separately Has the patient been seen at the hospital  within the last three years: Yes Total Score: 0 Level Of Care: ____ Jeff Rios ([id number]) Electronic Signature(s) Signed: 07/02/2020 5:03:53 PM By: Georges Mouse, Minus Breeding RN Entered By: Georges Mouse, Minus Breeding on 07/02/2020 11:44:38 Jeff Rios ([id number]) -------------------------------------------------------------------------------- Compression Therapy Details Patient Name: Jeff Rios Date of Service: 07/02/2020 10:30 AM Medical Record Number: [id number] Patient Account Number: [id number] Date of Birth/Sex: 07-Dec-1965 (55 y.o. M) Treating RN: Dolan Amen Primary Care Provider: PATIENT, NO Other Clinician: Referring Provider: Roland Rack Treating Provider/Extender: Skipper Cliche in Treatment: 3 Compression Therapy Performed for Wound Assessment: Wound #1 Right,Posterior Lower Leg Performed By: Clinician Dolan Amen, RN Compression Type: Three Layer Pre Treatment ABI: 1.2 Post Procedure Diagnosis Same as Pre-procedure Electronic Signature(s) Signed: 07/02/2020 5:03:53 PM By: Georges Mouse, Minus Breeding RN Entered By: Georges Mouse, Minus Breeding on 07/02/2020 11:41:32 Jeff Rios ([id number]) -------------------------------------------------------------------------------- Encounter Discharge Information Details Patient Name: Jeff Rios Date of Service: 07/02/2020 10:30 AM Medical Record Number: [id number] Patient Account Number: [id number] Date of Birth/Sex: Jun 05, 1965 (55 y.o. M) Treating RN: Dolan Amen Primary Care Provider: PATIENT, NO Other Clinician: Referring Provider: Roland Rack Treating Provider/Extender: Skipper Cliche in Treatment: 3 Encounter Discharge Information Items Discharge Condition: Stable Ambulatory Status: Ambulatory Discharge Destination: Home Transportation: Private  Auto Accompanied By: self Schedule Follow-up Appointment: Yes Clinical Summary of Care: Patient Declined Electronic Signature(s) Signed: 07/02/2020 4:52:45 PM By: Carlene Coria RN Entered By: Carlene Coria on 07/02/2020 14:31:26 Jeff Rios ([id number]) -------------------------------------------------------------------------------- Lower Extremity Assessment Details Patient Name: Jeff Rios Date of Service: 07/02/2020 10:30 AM Medical Record Number: [id number] Patient Account Number: [id number] Date of Birth/Sex: 1965/07/01 (55 y.o. M) Treating RN: Carlene Coria Primary Care Provider: PATIENT, NO Other Clinician: Referring Provider: Roland Rack Treating Provider/Extender: Skipper Cliche in Treatment: 3 Edema Assessment Assessed: [Left: No] [Right: No] [Left: Edema] [Right: :] Calf Left: Right: Point of Measurement: 30 cm From Medial Instep 33 cm Ankle Left: Right: Point of Measurement: 10 cm From Medial Instep 22.5 cm Electronic Signature(s) Signed: 07/02/2020 4:52:45 PM By: Carlene Coria RN Entered By: Carlene Coria on 07/02/2020 10:59:18 Jeff Rios ([id number]) -------------------------------------------------------------------------------- Multi Wound Chart Details Patient Name: Jeff Rios Date of Service: 07/02/2020 10:30 AM Medical Record Number: [id number] Patient Account Number: [id number] Date of Birth/Sex: Apr 03, 1966 (55 y.o. M) Treating RN: Dolan Amen Primary Care Provider: PATIENT, NO Other Clinician: Referring Provider: Roland Rack Treating Provider/Extender: Skipper Cliche in Treatment: 3 Vital Signs Height(in): 63 Pulse(bpm): 77 Weight(lbs): 182 Blood Pressure(mmHg): 125/77 Body Mass Index(BMI): 32 Temperature(F): 98.1 Respiratory Rate(breaths/min): 16 Photos: [1:No Photos] [N/A:N/A] Wound Location: [1:Right, Posterior Lower Leg] [N/A:N/A] Wounding Event: [1:Trauma] [N/A:N/A] Primary Etiology: [1:Trauma, Other]  [N/A:N/A] Comorbid History: [1:Coronary Artery Disease, Hypertension, Myocardial Infarction] [N/A:N/A] Date Acquired: [1:05/01/2020] [N/A:N/A] Weeks of Treatment: [1:3] [N/A:N/A] Wound Status: [1:Open] [N/A:N/A] Measurements L x W x D (cm) [1:2.4x0.7x0.3] [N/A:N/A] Area (cm) : [1:1.319] [N/A:N/A] Volume (cm) : [1:0.396] [N/A:N/A] % Reduction in Area: [1:82.70%] [N/A:N/A] % Reduction in Volume: [1:92.60%] [N/A:N/A] Classification: [1:Full Thickness Without Exposed Support Structures] [N/A:N/A] Exudate Amount: [1:Medium] [N/A:N/A] Exudate Type: [1:Serosanguineous] [N/A:N/A] Exudate Color: [1:red, brown] [N/A:N/A] Wound Margin: [1:Flat and Intact] [N/A:N/A] Granulation Amount: [1:Large (67-100%)] [N/A:N/A] Granulation Quality: [1:Red, Hyper-granulation] [N/A:N/A] Necrotic Amount: [1:None Present (0%)] [N/A:N/A] Exposed Structures: [1:Fat Layer (Subcutaneous Tissue): Yes Fascia: No Tendon: No Muscle: No Joint: No Bone: No Small (1-33%)] [N/A:N/A N/A] Treatment Notes Electronic Signature(s) Signed: 07/02/2020 5:03:53 PM By: Georges Mouse, Minus Breeding RN Entered By: Georges Mouse, Minus Breeding on 07/02/2020 11:41:06 Jeff Rios ([id number]) -------------------------------------------------------------------------------- [place] Details Patient Name: Jeff Rios  Date of Service: 07/02/2020 10:30 AM Medical Record Number: [id number] Patient Account Number: [id number] Date of Birth/Sex: August 08, 1965 (55 y.o. M) Treating RN: Dolan Amen Primary Care Provider: PATIENT, NO Other Clinician: Referring Provider: Roland Rack Treating Provider/Extender: Skipper Cliche in Treatment: 3 Active Inactive Wound/Skin Impairment Nursing Diagnoses: Knowledge deficit related to ulceration/compromised skin integrity Goals: Patient/caregiver will verbalize understanding of skin care regimen Date Initiated: 06/10/2020 Date Inactivated: 07/02/2020 Target Resolution Date:  07/08/2020 Goal Status: Met Ulcer/skin breakdown will have a volume reduction of 30% by week 4 Date Initiated: 06/10/2020 Target Resolution Date: 07/08/2020 Goal Status: Active Ulcer/skin breakdown will have a volume reduction of 50% by week 8 Date Initiated: 06/10/2020 Target Resolution Date: 08/08/2020 Goal Status: Active Ulcer/skin breakdown will have a volume reduction of 80% by week 12 Date Initiated: 06/10/2020 Target Resolution Date: 09/07/2020 Goal Status: Active Ulcer/skin breakdown will heal within 14 weeks Date Initiated: 06/10/2020 Target Resolution Date: 10/08/2020 Goal Status: Active Interventions: Assess patient/caregiver ability to obtain necessary supplies Assess patient/caregiver ability to perform ulcer/skin care regimen upon admission and as needed Assess ulceration(s) every visit Notes: Electronic Signature(s) Signed: 07/02/2020 5:03:53 PM By: Georges Mouse, Minus Breeding RN Entered By: Georges Mouse, Minus Breeding on 07/02/2020 11:40:54 Jeff Rios ([id number]) -------------------------------------------------------------------------------- Pain Assessment Details Patient Name: Jeff Rios Date of Service: 07/02/2020 10:30 AM Medical Record Number: [id number] Patient Account Number: [id number] Date of Birth/Sex: [birth year] (55 y.o. M) Treating RN: Carlene Coria Primary Care Provider: PATIENT, NO Other Clinician: Referring Provider: Roland Rack Treating Provider/Extender: Skipper Cliche in Treatment: 3 Active Problems Location of Pain Severity and Description of Pain Patient Has Paino No Site Locations Pain Management and Medication Current Pain Management: Electronic Signature(s) Signed: 07/02/2020 4:52:45 PM By: Carlene Coria RN Entered By: Carlene Coria on 07/02/2020 10:49:57 Jeff Rios ([id number]) -------------------------------------------------------------------------------- Patient/Caregiver Education Details Patient Name: Jeff Rios Date of  Service: 07/02/2020 10:30 AM Medical Record Number: [id number] Patient Account Number: [id number] Date of Birth/Gender: 1965/07/03 (55 y.o. M) Treating RN: Dolan Amen Primary Care Physician: PATIENT, NO Other Clinician: Referring Physician: Roland Rack Treating Physician/Extender: Skipper Cliche in Treatment: 3 Education Assessment Education Provided To: Patient Education Topics Provided Wound/Skin Impairment: Methods: Explain/Verbal Responses: State content correctly Electronic Signature(s) Signed: 07/02/2020 5:03:53 PM By: Georges Mouse, Minus Breeding RN Entered By: Georges Mouse, Minus Breeding on 07/02/2020 11:44:56 Jeff Rios ([id number]) -------------------------------------------------------------------------------- Wound Assessment Details Patient Name: Jeff Rios Date of Service: 07/02/2020 10:30 AM Medical Record Number: [id number] Patient Account Number: [id number] Date of Birth/Sex: [birth year] (55 y.o. M) Treating RN: Carlene Coria Primary Care Provider: PATIENT, NO Other Clinician: Referring Provider: Roland Rack Treating Provider/Extender: Skipper Cliche in Treatment: 3 Wound Status Wound Number: 1 Primary Trauma, Other Etiology: Wound Location: Right, Posterior Lower Leg Wound Status: Open Wounding Event: Trauma Comorbid Coronary Artery Disease, Hypertension, Myocardial Date Acquired: 05/01/2020 History: Infarction Weeks Of Treatment: 3 Clustered Wound: No Wound Measurements Length: (cm) 2.4 Width: (cm) 0.7 Depth: (cm) 0.3 Area: (cm) 1.319 Volume: (cm) 0.396 % Reduction in Area: 82.7% % Reduction in Volume: 92.6% Epithelialization: Small (1-33%) Tunneling: No Undermining: No Wound Description Classification: Full Thickness Without Exposed Support Structures Wound Margin: Flat and Intact Exudate Amount: Medium Exudate Type: Serosanguineous Exudate Color: red, brown Foul Odor After Cleansing: No Slough/Fibrino No Wound Bed Granulation  Amount: Large (67-100%) Exposed Structure Granulation Quality: Red, Hyper-granulation Fascia Exposed: No Necrotic Amount: None Present (0%) Fat Layer (Subcutaneous Tissue) Exposed: Yes Tendon Exposed: No Muscle Exposed: No Joint Exposed: No Bone Exposed: No Treatment Notes Wound #1 (Lower Leg) Wound  Laterality: Right, Posterior Cleanser Normal Saline Discharge Instruction: Wash your hands with soap and water. Remove old dressing, discard into plastic bag and place into trash. Cleanse the wound with Normal Saline prior to applying a clean dressing using gauze sponges, not tissues or cotton balls. Do not scrub or use excessive force. Pat dry using gauze sponges, not tissue or cotton balls. Soap and Water Discharge Instruction: Gently cleanse wound with antibacterial soap, rinse and pat dry prior to dressing wounds Peri-Wound Care Topical Primary Dressing Hydrofera Blue Ready Transfer Foam, 2.5x2.5 (in/in) Discharge Instruction: Apply Hydrofera Blue Ready to fit wound bed Secondary Dressing ABD Pad 5x9 (in/in) Discharge Instruction: Cover with ABD pad Jeff Rios, Jeff Rios ([id number]) Secured With Compression Wrap Profore Lite LF 3 Multilayer Compression Bandaging System Discharge Instruction: Apply 3 multi-layer wrap as prescribed. Compression Stockings Add-Ons Electronic Signature(s) Signed: 07/02/2020 4:52:45 PM By: Carlene Coria RN Entered By: Carlene Coria on 07/02/2020 10:58:43 Jeff Rios ([id number]) -------------------------------------------------------------------------------- Vitals Details Patient Name: Jeff Rios Date of Service: 07/02/2020 10:30 AM Medical Record Number: [id number] Patient Account Number: [id number] Date of Birth/Sex: 15-Nov-1965 (55 y.o. M) Treating RN: Carlene Coria Primary Care Provider: PATIENT, NO Other Clinician: Referring Provider: Roland Rack Treating Provider/Extender: Skipper Cliche in Treatment: 3 Vital Signs Time Taken:  10:49 Temperature (F): 98.1 Height (in): 63 Pulse (bpm): 77 Weight (lbs): 182 Respiratory Rate (breaths/min): 16 Body Mass Index (BMI): 32.2 Blood Pressure (mmHg): 125/77 Reference Range: 80 - 120 mg / dl Electronic Signature(s) Signed: 07/02/2020 4:52:45 PM By: Carlene Coria RN Entered By: Carlene Coria on 07/02/2020 10:49:50

## 2020-07-05 ENCOUNTER — Telehealth: Payer: Self-pay | Admitting: *Deleted

## 2020-07-05 NOTE — Telephone Encounter (Signed)
-----   Message from Lennon Alstrom, PA-C sent at 07/04/2020  6:18 PM EST ----- Echo showed: --Normal pump function. Unchanged from his 2020 study. --Walls of the heart are moving normally - normal heart squeeze. --Mildly increased thickness of the bottom left chamber, which was seen in 2020, and can happen with elevated BP. --Walls are slightly slow to relax, which was seen in 2020, and can happen with elevated BP.  --Mildly leaky tricuspid valve. --Mildly leaky mitral valve. --Trivial leak from the aortic valve.  Recommendations:  Ensure optimal BP control moving forward. We will keep an eye on his valves with periodic repeat echos, but this is not a concerning finding at this time. Overall, a very reassuring study. We can discuss this further and at our upcoming visit.

## 2020-07-05 NOTE — Telephone Encounter (Signed)
-----   Message from Lennon Alstrom, PA-C sent at 07/04/2020  6:08 PM EST ----- Good news! Stress test was low risk and a normal study. There were no signs of blockages. Very reassuring resultc.

## 2020-07-05 NOTE — Telephone Encounter (Signed)
No answer/Voicemail box is full.  

## 2020-07-07 NOTE — Progress Notes (Signed)
Office Visit    Patient Name: Jeff Rios Date of Encounter: 07/08/2020  Primary Care Provider:  Patient, [name] Primary Cardiologist:  Lorine Bears, MD  Chief Complaint    Chief Complaint  Patient presents with  . Other    Follow up post Cardiac testing. Meds reviewed verbally with patient.     55 yo male with history of CAD, NSTEMI 03/18/2019 s/p PCI/DES to RCA and LAD, HTN, HLD, tobacco use (quit July 2021), idiopathic angiedema, and who presents today for follow-up after recent echo and MPI with ongoing SOB and new CP.  Past Medical History    Past Medical History:  Diagnosis Date  . CAD (coronary artery disease)   . HLD (hyperlipidemia)   . Hypertension    Past Surgical History:  Procedure Laterality Date  . CARDIAC CATHETERIZATION    . CORONARY STENT INTERVENTION N/A 03/18/2019   Procedure: CORONARY STENT INTERVENTION;  Surgeon: Iran Ouch, MD;  Location: [facility];  Service: Cardiovascular;  Laterality: N/A;  . CORONARY STENT INTERVENTION N/A 03/20/2019   Procedure: CORONARY STENT INTERVENTION;  Surgeon: Iran Ouch, MD;  Location: [facility];  Service: Cardiovascular;  Laterality: N/A;  . CORONARY/GRAFT ACUTE MI REVASCULARIZATION N/A 03/18/2019   Procedure: Coronary/Graft Acute MI Revascularization;  Surgeon: Iran Ouch, MD;  Location: [facility];  Service: Cardiovascular;  Laterality: N/A;  . LEFT HEART CATH AND CORONARY ANGIOGRAPHY N/A 03/18/2019   Procedure: LEFT HEART CATH AND CORONARY ANGIOGRAPHY;  Surgeon: Iran Ouch, MD;  Location: [facility];  Service: Cardiovascular;  Laterality: N/A;    Allergies  Allergies  Allergen Reactions  . Amlodipine Swelling    Patient states he had an allergic reaction and was possibly from amlodipine. Taken off that and switched to metoprolol.   . Imdur [Isosorbide Nitrate] Other (See Comments)    Severe headache    History of Present Illness     Jeff Rios is a 55 y.o. 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  Previously, there was concern regarding a possible reaction to amlodipine, subsequently switched to metoprolol.  He has subsequently been tolerating amlodipine.    He was admitted 03/2019 with inferior ST elevation myocardial infarction.  He reported severe substernal chest discomfort that woke him from sleep.  Cardiac catheterization showed significant 2v CAD with an occluded distal RCA and significant mLAD stenosis.  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  He underwent successful PCI and DES to the distal RCA with staged LAD PCI.    He was last seen in clinic by his primary cardiologist, Dr. Kirke Corin, 02/01/2020.  He was doing well without CP or DOE.  He had run out of atorvastatin, carvedilol, and clopidogrel.  He continued to have difficulty obtaining his medications due to lack of transportation and cost of co-pays.  He had quit smoking 2 months prior. He later stopped Imdur for HA.     He was seen in the ED 05/05/2020 for right ankle sprain/leg injury.   Seen 05/17/20 with breathing difficulties, described as similar to before his PCI.  He noted racing HR at rest. He was taking his medications via a bubble pack. He was concerned for a blockage. He reported ongoing smoking cessation. He was not regularly checking his blood pressure.  He  had mild lower extremity edema.He was drinking more soda / juice than water.   Subsequent NM study unrevealing and stable echo as below. RVSP 28.6 with nl PASP, G1DD, mild MR, mild LVH.   Today, 07/08/2020, he returns to clinic and notes that he still has dyspnea with exertion.  Over the last 2 days, he has also noted chest pain with exertion that occurs in the center of his chest and  feels like burning.  He notes some associated nausea with his most recent episode. He denies any association with food.  Pain is nonpleuritic or positional.  Pain is also nonradiating.  Both chest pain and dyspnea can last up to 30 minutes but resolve with rest.  He does not experience shortness of breath or chest pain at rest. He reports pain as similar to before his interventions though not as severe. He does admit to some deconditioning, as he has not worked out in 1 year. He is still concerned regarding his sx.  No racing heart rate or palpitations.  No presyncope or syncope.  He reports taking his medications regularly, though he is unable to list those at this time. He is not regularly taking his blood pressure.  He has noted recent weight gain. He wonders if this is due to volume today.  He has lower extremity edema of his right lower extremity, which he associates with his recent Achilles tendon injury.  He does not really note any left lower extremity edema.  He denies any PND, abdominal distention, orthopnea, or other signs/symptoms of volume overload.  No signs or symptoms reported of bleeding.  On review of recent clinic weights, his weight is noted to be up from previous clinic weights at 192.  BP 128/72. Ongoing smoking cessation reported.  Home Medications    Current Outpatient Medications on File Prior to Visit  Medication Sig Dispense Refill  . Acetaminophen 500 MG capsule SMARTSIG:2 Capsule(s) By Mouth 3 Times Daily PRN    . amLODipine (NORVASC) 10 MG tablet Take 1 tablet (10 mg total) by mouth daily. 30 tablet 11  . aspirin 81 MG EC tablet Take 1 tablet (81 mg total) by mouth daily. 30 tablet 11  . atorvastatin (LIPITOR) 80 MG tablet Take 1 tablet (80 mg total) by mouth daily. 30 tablet 11  . butalbital-acetaminophen-caffeine (FIORICET) 50-325-40 MG tablet Take 1-2 tablets by mouth every 6 (six) hours as needed for headache. 20 tablet 0  . carvedilol (COREG) 3.125 MG tablet Take 1  tablet (3.125 mg total) by mouth 2 (two) times daily with a meal. 60 tablet 11  . clopidogrel (PLAVIX) 75 MG tablet Take 1 tablet (75 mg total) by mouth daily. 30 tablet 11  . lisinopril (ZESTRIL) 20 MG tablet Take 1 tablet (20 mg total) by mouth daily. 30 tablet 11  . naproxen (NAPROSYN) 500 MG tablet Take 500 mg by mouth 3 (three) times daily.    . nitroGLYCERIN (NITROSTAT) 0.4 MG SL tablet Place 1 tablet (0.4 mg total) under the tongue every 5 (five) minutes as needed for chest pain. 30 tablet 12   No current facility-administered medications on file prior to visit.    Review of Systems    He reports exertional chest pain x2 days with last episode also associated with nausea and ongoing shortness of breath that resolves with several minutes of rest.  No sx at rest. He denies palpitations, pnd, orthopnea, v, dizziness, syncope, or early satiety.  He continues to report right lower extremity issues  surrounding his injury, as well as swelling surrounding his injury.  He reports recent weight gain.  All other systems reviewed and are otherwise negative except as noted above.  Physical Exam    VS:  BP 128/72 (BP Location: Left Arm, Patient Position: Sitting, Cuff Size: Normal)   Pulse 84   Ht 5\' 3"  (1.6 m)   Wt 192 lb (87.1 kg)   SpO2 96%   BMI 34.01 kg/m  , BMI Body mass index is 34.01 kg/m. GEN: Well nourished, well developed, in no acute distress. HEENT: normal. Neck: Supple, no JVD, carotid bruits, or masses. Cardiac: RRR, no murmurs, rubs, or gallops. No clubbing, cyanosis. Mild left-sided LEE edema.  Right lower extremity is in a cast/boot  Radials/DP/PT 2+ and equal bilaterally.  Respiratory:  Respirations regular and unlabored, clear to auscultation bilaterally. GI: Soft, nontender, nondistended, BS + x 4. MS: no deformity or atrophy. Skin: warm and dry, no rash.  Neuro:  Strength and sensation are intact. Psych: Normal affect.  Accessory Clinical Findings    ECG personally  reviewed by me today - NSR, 84 bpm, PR interval 162 ms, TWI V5-6, TWI in I new from previous 05/2020 EKG- no acute changes.  VITALS Reviewed today   Temp Readings from Last 3 Encounters:  05/05/20 98.1 F (36.7 C) (Oral)  11/12/19 97.9 F (36.6 C) (Oral)  04/14/19 (!) 97.5 F (36.4 C)   BP Readings from Last 3 Encounters:  07/08/20 128/72  05/17/20 120/82  05/05/20 124/81   Pulse Readings from Last 3 Encounters:  07/08/20 84  05/17/20 84  05/05/20 85    Wt Readings from Last 3 Encounters:  07/08/20 192 lb (87.1 kg)  05/17/20 184 lb (83.5 kg)  05/05/20 180 lb (81.6 kg)     LABS  reviewed today   Lab Results  Component Value Date   WBC 5.8 11/12/2019   HGB 13.8 11/12/2019   HCT 37.3 (L) 11/12/2019   MCV 77.9 (L) 11/12/2019   PLT 240 11/12/2019   Lab Results  Component Value Date   CREATININE 1.22 11/12/2019   BUN 11 11/12/2019   NA 138 11/12/2019   K 3.4 (L) 11/12/2019   CL 109 11/12/2019   CO2 23 11/12/2019   Lab Results  Component Value Date   ALT 34 09/05/2019   AST 25 09/05/2019   ALKPHOS 60 09/05/2019   BILITOT 1.1 09/05/2019   Lab Results  Component Value Date   CHOL 215 (H) 09/05/2019   HDL 61 09/05/2019   LDLCALC 131 (H) 09/05/2019   TRIG 117 09/05/2019   CHOLHDL 3.5 09/05/2019    Lab Results  Component Value Date   HGBA1C 5.4 03/18/2019   No results found for: TSH   STUDIES/PROCEDURES reviewed today   Echo 06/28/20 1. Left ventricular ejection fraction, by estimation, is 60 to 65%. The  left ventricle has normal function. The left ventricle has no regional  wall motion abnormalities. There is mild left ventricular hypertrophy.  Left ventricular diastolic parameters  are consistent with Grade I diastolic dysfunction (impaired relaxation).  2. Right ventricular systolic function is normal. The right ventricular  size is normal. There is normal pulmonary artery systolic pressure. The  estimated right ventricular systolic pressure is  28.6 mmHg.  3. The mitral valve is normal in structure. Mild mitral valve  regurgitation.   NM Study 07/03/20  There was no ST segment deviation noted during stress.  No T wave inversion was noted during stress.  The study  is normal.  This is a low risk study.  The left ventricular ejection fraction is hyperdynamic (>72%).  There is no evidence for ischemia   PCI 03/20/2019  RPDA lesion is 40% stenosed.  Prox RCA lesion is 30% stenosed.  Previously placed Dist RCA drug eluting stent is widely patent.  Balloon angioplasty was performed.  Mid LAD lesion is 80% stenosed.  Post intervention, there is a 0% residual stenosis.  A drug-eluting stent was successfully placed using a STENT RESOLUTE ONYX 3.0X15. Successful angioplasty and drug-eluting stent placement to the mid LAD. Recommendations: Continue dual antiplatelet therapy for at least 1 year. Aggressive treatment of risk factors. Likely discharge home tomorrow. He is to stay off work for 2 weeks.   Echo 03/18/19 1. Left ventricular ejection fraction, by visual estimation, is 60 to  65%. The left ventricle has normal function. There is mildly increased  left ventricular hypertrophy.  2. Left ventricular diastolic parameters are consistent with Grade I  diastolic dysfunction (impaired relaxation).  3. Global right ventricle has normal systolic function.The right  ventricular size is normal. No increase in right ventricular wall  thickness.  4. The mitral valve is normal in structure. No evidence of mitral valve  regurgitation. No evidence of mitral stenosis.  5. The tricuspid valve is normal in structure. Tricuspid valve  regurgitation is not demonstrated.  6. The aortic valve is normal in structure. Aortic valve regurgitation is  not visualized. No evidence of aortic valve sclerosis or stenosis.  7. The pulmonic valve was normal in structure. Pulmonic valve  regurgitation is trivial.  8. TR signal is  inadequate for assessing pulmonary artery systolic  pressure.  9. The inferior vena cava is normal in size with greater than 50%  respiratory variability, suggesting right atrial pressure of 3 mmHg.   03/18/19 PCI  The left ventricular systolic function is normal.  LV end diastolic pressure is normal.  The left ventricular ejection fraction is 55-65% by visual estimate.  Mid LAD lesion is 80% stenosed.  Prox RCA lesion is 30% stenosed.  Dist RCA lesion is 100% stenosed.  Post intervention, there is a 0% residual stenosis.  A drug-eluting stent was successfully placed using a STENT RESOLUTE ONYX 2.25X22.  RPDA lesion is 40% stenosed. 1.  Significant two-vessel coronary artery disease.  The culprit is an occluded distal right coronary artery with faint left-to-right collaterals.  The supplied area is not large and that is the likely reason for nondiagnostic EKG changes for inferior STEMI.  There is also significant mid LAD stenosis which appears to be hazy. 2.  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  Significantly elevated LVEDP in the low 30s. 3.  Successful angioplasty and drug-eluting stent placement to the distal RCA. Recommendations: Continue dual antiplatelet therapy for at least 1 year.  Aggressive treatment of risk factors. Recommend staged PCI of the LAD before hospital discharge.  This is planned for Monday morning.   Assessment & Plan    CAD s/p PCI -- Reports exertional CP and shortness of breath.  Reports sx as similar but less intense as before prior interventions. S/p PCI/DES to the RCA with staged PCI/DES to the LAD 03/2019.  He has been maintained on DAPT with medication compliance issues in the past and financial barriers noted. He cannot tolerate Imdur due to HA.  Due to financial barriers, will defer Ranexa. Echo and MPI performed for his  sx  as above and unrevealing; however, today, he reports ongoing/progressive symptoms. No current CP and denies sx at rest. Given his risk factors, including known CAD and previous compliance issues with DAPT, HLD with LDL uncontrolled, as well as h/o tobacco use, case discussed with Dr. Azucena CecilAgbor-Etang and LHC recommended at this time for ongoing and progressive sx.   Will check CBC, BMET, COVID-19 test in preparation for LHC as recommended today.   Risks and benefits of cardiac catheterization have been discussed with the patient.  These include bleeding, infection, kidney damage, stroke, heart attack, death.  The patient understands these risks and is willing to proceed.    Diastolic dysfunction -- Reports DOE. No SOB at rest. Wt increased and LEE reported with consideration of injury to R ankle. He wonders if he is holding on to fluid; however, relatively euvolemic on exam despite wt gain. Not previously on a diuretic. Consider that amlodipine could contribute to LEE. We reviewed salt and fluid restrictions.  Updated echo as above.  Will check a BMET and CBC - reassess at RTC and consider if diuretic indicated at that time. Continue to recommend leg elevation and compression for dependent edema.  Reviewed fluid restrictions under 2 L daily and total salt under 2 g daily with patient understanding.  Further recommendations if indicated pending updated echo.  HTN, goal BP 130/80 or lower -- Current BP well controlled.  Encouraged BP checks at home. Previously supplied with a BP cuff from social work as noted by Gillian Shieldsaitlin Walker, NP.  He has not been checking his BP. Monitor salt and fluid intake, reviewed today in detail. Continue current carvedilol, lisinopril, and amlodipine.  Mobic is not recommended, as this can elevate BP and is not ideal from a cardiovascular and renal standpoint.    HLD, LDL goal <70 --PCP labs reviewed and included cholesterol labs from 02/2020 with total cholesterol 117, triglycerides  63, HDL 54, LDL 49 (02/27/2020). LDL controlled and at goal. Previously LDL 131 with total cholesterol 215. Continue current statin. Unable to afford Zetia on review of EMR.   History of tobacco use --Congratulated on quitting smoking. Ongoing cessation encouraged.  Recent right lower extremity wound -- Recommend close monitoring per PCP/wound care/podiatry as recommended.  Medication compliance -- As above, he is agreeable to call the office with report of which medications are in his blister pack.  He reports compliance with his blister pack but is uncertain which medications are available in it.   Medication changes: None. Possible Lasix in the near future. Labs ordered: BMET, CBC, COVID test Studies / Imaging ordered: LHC as discussed with Dr. Azucena CecilAgbor-Etang today Future considerations: Pending results of LHC Disposition: RTC after cath  Total time spent with patient today 45 minutes. This includes reviewing records, evaluating the patient, and coordinating care. Face-to-face time >50%.    Lennon AlstromJacquelyn D , PA-C 07/08/2020

## 2020-07-07 NOTE — H&P (View-Only) (Signed)
  Office Visit    Patient Name: Jeff Rios Date of Encounter: 07/08/2020  Primary Care Provider:  Patient, [name] Primary Cardiologist:  Muhammad Arida, MD  Chief Complaint    Chief Complaint  Patient presents with  . Other    Follow up post Cardiac testing. Meds reviewed verbally with patient.     55 yo male with history of CAD, NSTEMI 03/18/2019 s/p PCI/DES to RCA and LAD, HTN, HLD, tobacco use (quit July 2021), idiopathic angiedema, and who presents today for follow-up after recent echo and MPI with ongoing SOB and new CP.  Past Medical History    Past Medical History:  Diagnosis Date  . CAD (coronary artery disease)   . HLD (hyperlipidemia)   . Hypertension    Past Surgical History:  Procedure Laterality Date  . CARDIAC CATHETERIZATION    . CORONARY STENT INTERVENTION N/A 03/18/2019   Procedure: CORONARY STENT INTERVENTION;  Surgeon: Arida, Muhammad A, MD;  Location: [facility];  Service: Cardiovascular;  Laterality: N/A;  . CORONARY STENT INTERVENTION N/A 03/20/2019   Procedure: CORONARY STENT INTERVENTION;  Surgeon: Arida, Muhammad A, MD;  Location: [facility];  Service: Cardiovascular;  Laterality: N/A;  . CORONARY/GRAFT ACUTE MI REVASCULARIZATION N/A 03/18/2019   Procedure: Coronary/Graft Acute MI Revascularization;  Surgeon: Arida, Muhammad A, MD;  Location: [facility];  Service: Cardiovascular;  Laterality: N/A;  . LEFT HEART CATH AND CORONARY ANGIOGRAPHY N/A 03/18/2019   Procedure: LEFT HEART CATH AND CORONARY ANGIOGRAPHY;  Surgeon: Arida, Muhammad A, MD;  Location: [facility];  Service: Cardiovascular;  Laterality: N/A;    Allergies  Allergies  Allergen Reactions  . Amlodipine Swelling    Patient states he had an allergic reaction and was possibly from amlodipine. Taken off that and switched to metoprolol.   . Imdur [Isosorbide Nitrate] Other (See Comments)    Severe headache    History of Present Illness     Jeff Rios is a 55 y.o. 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  Previously, there was concern regarding a possible reaction to amlodipine, subsequently switched to metoprolol.  He has subsequently been tolerating amlodipine.    He was admitted 03/2019 with inferior ST elevation myocardial infarction.  He reported severe substernal chest discomfort that woke him from sleep.  Cardiac catheterization showed significant 2v CAD with an occluded distal RCA and significant mLAD stenosis.  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  He underwent successful PCI and DES to the distal RCA with staged LAD PCI.    He was last seen in clinic by his primary cardiologist, Dr. Arida, 02/01/2020.  He was doing well without CP or DOE.  He had run out of atorvastatin, carvedilol, and clopidogrel.  He continued to have difficulty obtaining his medications due to lack of transportation and cost of co-pays.  He had quit smoking 2 months prior. He later stopped Imdur for HA.     He was seen in the ED 05/05/2020 for right ankle sprain/leg injury.   Seen 05/17/20 with breathing difficulties, described as similar to before his PCI.  He noted racing HR at rest. He was taking his medications via a bubble pack. He was concerned for a blockage. He reported ongoing smoking cessation. He was not regularly checking his blood pressure.  He   had mild lower extremity edema.He was drinking more soda / juice than water.   Subsequent NM study unrevealing and stable echo as below. RVSP 28.6 with nl PASP, G1DD, mild MR, mild LVH.   Today, 07/08/2020, he returns to clinic and notes that he still has dyspnea with exertion.  Over the last 2 days, he has also noted chest pain with exertion that occurs in the center of his chest and  feels like burning.  He notes some associated nausea with his most recent episode. He denies any association with food.  Pain is nonpleuritic or positional.  Pain is also nonradiating.  Both chest pain and dyspnea can last up to 30 minutes but resolve with rest.  He does not experience shortness of breath or chest pain at rest. He reports pain as similar to before his interventions though not as severe. He does admit to some deconditioning, as he has not worked out in 1 year. He is still concerned regarding his sx.  No racing heart rate or palpitations.  No presyncope or syncope.  He reports taking his medications regularly, though he is unable to list those at this time. He is not regularly taking his blood pressure.  He has noted recent weight gain. He wonders if this is due to volume today.  He has lower extremity edema of his right lower extremity, which he associates with his recent Achilles tendon injury.  He does not really note any left lower extremity edema.  He denies any PND, abdominal distention, orthopnea, or other signs/symptoms of volume overload.  No signs or symptoms reported of bleeding.  On review of recent clinic weights, his weight is noted to be up from previous clinic weights at 192.  BP 128/72. Ongoing smoking cessation reported.  Home Medications    Current Outpatient Medications on File Prior to Visit  Medication Sig Dispense Refill  . Acetaminophen 500 MG capsule SMARTSIG:2 Capsule(s) By Mouth 3 Times Daily PRN    . amLODipine (NORVASC) 10 MG tablet Take 1 tablet (10 mg total) by mouth daily. 30 tablet 11  . aspirin 81 MG EC tablet Take 1 tablet (81 mg total) by mouth daily. 30 tablet 11  . atorvastatin (LIPITOR) 80 MG tablet Take 1 tablet (80 mg total) by mouth daily. 30 tablet 11  . butalbital-acetaminophen-caffeine (FIORICET) 50-325-40 MG tablet Take 1-2 tablets by mouth every 6 (six) hours as needed for headache. 20 tablet 0  . carvedilol (COREG) 3.125 MG tablet Take 1  tablet (3.125 mg total) by mouth 2 (two) times daily with a meal. 60 tablet 11  . clopidogrel (PLAVIX) 75 MG tablet Take 1 tablet (75 mg total) by mouth daily. 30 tablet 11  . lisinopril (ZESTRIL) 20 MG tablet Take 1 tablet (20 mg total) by mouth daily. 30 tablet 11  . naproxen (NAPROSYN) 500 MG tablet Take 500 mg by mouth 3 (three) times daily.    . nitroGLYCERIN (NITROSTAT) 0.4 MG SL tablet Place 1 tablet (0.4 mg total) under the tongue every 5 (five) minutes as needed for chest pain. 30 tablet 12   No current facility-administered medications on file prior to visit.    Review of Systems    He reports exertional chest pain x2 days with last episode also associated with nausea and ongoing shortness of breath that resolves with several minutes of rest.  No sx at rest. He denies palpitations, pnd, orthopnea, v, dizziness, syncope, or early satiety.  He continues to report right lower extremity issues  surrounding his injury, as well as swelling surrounding his injury.  He reports recent weight gain.  All other systems reviewed and are otherwise negative except as noted above.  Physical Exam    VS:  BP 128/72 (BP Location: Left Arm, Patient Position: Sitting, Cuff Size: Normal)   Pulse 84   Ht 5\' 3"  (1.6 m)   Wt 192 lb (87.1 kg)   SpO2 96%   BMI 34.01 kg/m  , BMI Body mass index is 34.01 kg/m. GEN: Well nourished, well developed, in no acute distress. HEENT: normal. Neck: Supple, no JVD, carotid bruits, or masses. Cardiac: RRR, no murmurs, rubs, or gallops. No clubbing, cyanosis. Mild left-sided LEE edema.  Right lower extremity is in a cast/boot  Radials/DP/PT 2+ and equal bilaterally.  Respiratory:  Respirations regular and unlabored, clear to auscultation bilaterally. GI: Soft, nontender, nondistended, BS + x 4. MS: no deformity or atrophy. Skin: warm and dry, no rash.  Neuro:  Strength and sensation are intact. Psych: Normal affect.  Accessory Clinical Findings    ECG personally  reviewed by me today - NSR, 84 bpm, PR interval 162 ms, TWI V5-6, TWI in I new from previous 05/2020 EKG- no acute changes.  VITALS Reviewed today   Temp Readings from Last 3 Encounters:  05/05/20 98.1 F (36.7 C) (Oral)  11/12/19 97.9 F (36.6 C) (Oral)  04/14/19 (!) 97.5 F (36.4 C)   BP Readings from Last 3 Encounters:  07/08/20 128/72  05/17/20 120/82  05/05/20 124/81   Pulse Readings from Last 3 Encounters:  07/08/20 84  05/17/20 84  05/05/20 85    Wt Readings from Last 3 Encounters:  07/08/20 192 lb (87.1 kg)  05/17/20 184 lb (83.5 kg)  05/05/20 180 lb (81.6 kg)     LABS  reviewed today   Lab Results  Component Value Date   WBC 5.8 11/12/2019   HGB 13.8 11/12/2019   HCT 37.3 (L) 11/12/2019   MCV 77.9 (L) 11/12/2019   PLT 240 11/12/2019   Lab Results  Component Value Date   CREATININE 1.22 11/12/2019   BUN 11 11/12/2019   NA 138 11/12/2019   K 3.4 (L) 11/12/2019   CL 109 11/12/2019   CO2 23 11/12/2019   Lab Results  Component Value Date   ALT 34 09/05/2019   AST 25 09/05/2019   ALKPHOS 60 09/05/2019   BILITOT 1.1 09/05/2019   Lab Results  Component Value Date   CHOL 215 (H) 09/05/2019   HDL 61 09/05/2019   LDLCALC 131 (H) 09/05/2019   TRIG 117 09/05/2019   CHOLHDL 3.5 09/05/2019    Lab Results  Component Value Date   HGBA1C 5.4 03/18/2019   No results found for: TSH   STUDIES/PROCEDURES reviewed today   Echo 06/28/20 1. Left ventricular ejection fraction, by estimation, is 60 to 65%. The  left ventricle has normal function. The left ventricle has no regional  wall motion abnormalities. There is mild left ventricular hypertrophy.  Left ventricular diastolic parameters  are consistent with Grade I diastolic dysfunction (impaired relaxation).  2. Right ventricular systolic function is normal. The right ventricular  size is normal. There is normal pulmonary artery systolic pressure. The  estimated right ventricular systolic pressure is  28.6 mmHg.  3. The mitral valve is normal in structure. Mild mitral valve  regurgitation.   NM Study 07/03/20  There was no ST segment deviation noted during stress.  No T wave inversion was noted during stress.  The study  is normal.  This is a low risk study.  The left ventricular ejection fraction is hyperdynamic (>72%).  There is no evidence for ischemia   PCI 03/20/2019  RPDA lesion is 40% stenosed.  Prox RCA lesion is 30% stenosed.  Previously placed Dist RCA drug eluting stent is widely patent.  Balloon angioplasty was performed.  Mid LAD lesion is 80% stenosed.  Post intervention, there is a 0% residual stenosis.  A drug-eluting stent was successfully placed using a STENT RESOLUTE ONYX 3.0X15. Successful angioplasty and drug-eluting stent placement to the mid LAD. Recommendations: Continue dual antiplatelet therapy for at least 1 year. Aggressive treatment of risk factors. Likely discharge home tomorrow. He is to stay off work for 2 weeks.   Echo 03/18/19 1. Left ventricular ejection fraction, by visual estimation, is 60 to  65%. The left ventricle has normal function. There is mildly increased  left ventricular hypertrophy.  2. Left ventricular diastolic parameters are consistent with Grade I  diastolic dysfunction (impaired relaxation).  3. Global right ventricle has normal systolic function.The right  ventricular size is normal. No increase in right ventricular wall  thickness.  4. The mitral valve is normal in structure. No evidence of mitral valve  regurgitation. No evidence of mitral stenosis.  5. The tricuspid valve is normal in structure. Tricuspid valve  regurgitation is not demonstrated.  6. The aortic valve is normal in structure. Aortic valve regurgitation is  not visualized. No evidence of aortic valve sclerosis or stenosis.  7. The pulmonic valve was normal in structure. Pulmonic valve  regurgitation is trivial.  8. TR signal is  inadequate for assessing pulmonary artery systolic  pressure.  9. The inferior vena cava is normal in size with greater than 50%  respiratory variability, suggesting right atrial pressure of 3 mmHg.   03/18/19 PCI  The left ventricular systolic function is normal.  LV end diastolic pressure is normal.  The left ventricular ejection fraction is 55-65% by visual estimate.  Mid LAD lesion is 80% stenosed.  Prox RCA lesion is 30% stenosed.  Dist RCA lesion is 100% stenosed.  Post intervention, there is a 0% residual stenosis.  A drug-eluting stent was successfully placed using a STENT RESOLUTE ONYX 2.25X22.  RPDA lesion is 40% stenosed. 1.  Significant two-vessel coronary artery disease.  The culprit is an occluded distal right coronary artery with faint left-to-right collaterals.  The supplied area is not large and that is the likely reason for nondiagnostic EKG changes for inferior STEMI.  There is also significant mid LAD stenosis which appears to be hazy. 2.  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  Significantly elevated LVEDP in the low 30s. 3.  Successful angioplasty and drug-eluting stent placement to the distal RCA. Recommendations: Continue dual antiplatelet therapy for at least 1 year.  Aggressive treatment of risk factors. Recommend staged PCI of the LAD before hospital discharge.  This is planned for Monday morning.   Assessment & Plan    CAD s/p PCI -- Reports exertional CP and shortness of breath.  Reports sx as similar but less intense as before prior interventions. S/p PCI/DES to the RCA with staged PCI/DES to the LAD 03/2019.  He has been maintained on DAPT with medication compliance issues in the past and financial barriers noted. He cannot tolerate Imdur due to HA.  Due to financial barriers, will defer Ranexa. Echo and MPI performed for his  sx  as above and unrevealing; however, today, he reports ongoing/progressive symptoms. No current CP and denies sx at rest. Given his risk factors, including known CAD and previous compliance issues with DAPT, HLD with LDL uncontrolled, as well as h/o tobacco use, case discussed with Dr. Azucena CecilAgbor-Etang and LHC recommended at this time for ongoing and progressive sx.   Will check CBC, BMET, COVID-19 test in preparation for LHC as recommended today.   Risks and benefits of cardiac catheterization have been discussed with the patient.  These include bleeding, infection, kidney damage, stroke, heart attack, death.  The patient understands these risks and is willing to proceed.    Diastolic dysfunction -- Reports DOE. No SOB at rest. Wt increased and LEE reported with consideration of injury to R ankle. He wonders if he is holding on to fluid; however, relatively euvolemic on exam despite wt gain. Not previously on a diuretic. Consider that amlodipine could contribute to LEE. We reviewed salt and fluid restrictions.  Updated echo as above.  Will check a BMET and CBC - reassess at RTC and consider if diuretic indicated at that time. Continue to recommend leg elevation and compression for dependent edema.  Reviewed fluid restrictions under 2 L daily and total salt under 2 g daily with patient understanding.  Further recommendations if indicated pending updated echo.  HTN, goal BP 130/80 or lower -- Current BP well controlled.  Encouraged BP checks at home. Previously supplied with a BP cuff from social work as noted by Gillian Shieldsaitlin Walker, NP.  He has not been checking his BP. Monitor salt and fluid intake, reviewed today in detail. Continue current carvedilol, lisinopril, and amlodipine.  Mobic is not recommended, as this can elevate BP and is not ideal from a cardiovascular and renal standpoint.    HLD, LDL goal <70 --PCP labs reviewed and included cholesterol labs from 02/2020 with total cholesterol 117, triglycerides  63, HDL 54, LDL 49 (02/27/2020). LDL controlled and at goal. Previously LDL 131 with total cholesterol 215. Continue current statin. Unable to afford Zetia on review of EMR.   History of tobacco use --Congratulated on quitting smoking. Ongoing cessation encouraged.  Recent right lower extremity wound -- Recommend close monitoring per PCP/wound care/podiatry as recommended.  Medication compliance -- As above, he is agreeable to call the office with report of which medications are in his blister pack.  He reports compliance with his blister pack but is uncertain which medications are available in it.   Medication changes: None. Possible Lasix in the near future. Labs ordered: BMET, CBC, COVID test Studies / Imaging ordered: LHC as discussed with Dr. Azucena CecilAgbor-Etang today Future considerations: Pending results of LHC Disposition: RTC after cath  Total time spent with patient today 45 minutes. This includes reviewing records, evaluating the patient, and coordinating care. Face-to-face time >50%.    Lennon AlstromJacquelyn D Bruno Leach, PA-C 07/08/2020

## 2020-07-08 ENCOUNTER — Encounter: Payer: Self-pay | Admitting: Physician Assistant

## 2020-07-08 ENCOUNTER — Other Ambulatory Visit: Payer: Self-pay

## 2020-07-08 ENCOUNTER — Ambulatory Visit (INDEPENDENT_AMBULATORY_CARE_PROVIDER_SITE_OTHER): Payer: Medicare Other | Admitting: Physician Assistant

## 2020-07-08 VITALS — BP 128/72 | HR 84 | Ht 63.0 in | Wt 192.0 lb

## 2020-07-08 DIAGNOSIS — E785 Hyperlipidemia, unspecified: Secondary | ICD-10-CM

## 2020-07-08 DIAGNOSIS — R072 Precordial pain: Secondary | ICD-10-CM

## 2020-07-08 DIAGNOSIS — I249 Acute ischemic heart disease, unspecified: Secondary | ICD-10-CM

## 2020-07-08 DIAGNOSIS — I25118 Atherosclerotic heart disease of native coronary artery with other forms of angina pectoris: Secondary | ICD-10-CM

## 2020-07-08 DIAGNOSIS — Z87891 Personal history of nicotine dependence: Secondary | ICD-10-CM

## 2020-07-08 DIAGNOSIS — I5032 Chronic diastolic (congestive) heart failure: Secondary | ICD-10-CM

## 2020-07-08 DIAGNOSIS — R0600 Dyspnea, unspecified: Secondary | ICD-10-CM | POA: Diagnosis not present

## 2020-07-08 DIAGNOSIS — I252 Old myocardial infarction: Secondary | ICD-10-CM

## 2020-07-08 DIAGNOSIS — R0609 Other forms of dyspnea: Secondary | ICD-10-CM

## 2020-07-08 DIAGNOSIS — I10 Essential (primary) hypertension: Secondary | ICD-10-CM

## 2020-07-08 MED ORDER — METOPROLOL TARTRATE 100 MG PO TABS: 100.0000 mg | ORAL_TABLET | Freq: Once | ORAL | 0 refills | Status: DC

## 2020-07-08 MED ORDER — SODIUM CHLORIDE 0.9% FLUSH: 3.0000 mL | Freq: Two times a day (BID) | INTRAVENOUS | Status: DC

## 2020-07-08 MED ORDER — ISOSORBIDE MONONITRATE ER 30 MG PO TB24: 30.0000 mg | ORAL_TABLET | Freq: Every day | ORAL | 5 refills | Status: DC

## 2020-07-08 NOTE — Telephone Encounter (Signed)
Results to be reviewed at appointment today.

## 2020-07-08 NOTE — Patient Instructions (Addendum)
Medication Instructions:  Your physician recommends that you continue on your current medications as directed. Please refer to the Current Medication list given to you today.  *If you need a refill on your cardiac medications before your next appointment, please call your pharmacy*   Lab Work:  1) Your physician recommends that you have lab work TODAY: [facility]  2) Your physician recommends that you return for lab work to the [facility] in: 1 week ([facility]) -  Please go to the [facility]. You will check in at the front desk to the right as you walk into the atrium. Valet Parking is offered if needed. - No appointment needed. You may go any day between 7 am and 6 pm.   Testing/Procedures: None ordered   Follow-Up: At [facility], you and your health needs are our priority.  As part of our continuing mission to provide you with exceptional heart care, we have created designated Provider Care Teams.  These Care Teams include your primary Cardiologist (physician) and Advanced Practice Providers (APPs -  Physician Assistants and Nurse Practitioners) who all work together to provide you with the care you need, when you need it.  We recommend signing up for the patient portal called "MyChart".  Sign up information is provided on this After Visit Summary.  MyChart is used to connect with patients for Virtual Visits (Telemedicine).  Patients are able to view lab/test results, encounter notes, upcoming appointments, etc.  Non-urgent messages can be sent to your provider as well.   To learn more about what you can do with MyChart, go to [email or web address].    Your next appointment:   2 week(s)  The format for your next appointment:   In Person  Provider:   You may see Lorine Bears, MD or one of the following Advanced Practice Providers on your designated Care Team:    Nicolasa Ducking, NP  Eula Listen, PA-C  Marisue Ivan, PA-C  [employer], [place]  Gillian Shields,  NP    Other Instructions  More information: Pending your labs, we may start you on a fluid pill, called Lasix.

## 2020-07-09 ENCOUNTER — Telehealth: Payer: Self-pay | Admitting: *Deleted

## 2020-07-09 ENCOUNTER — Encounter: Payer: Medicare Other | Admitting: Physician Assistant

## 2020-07-09 DIAGNOSIS — S81801A Unspecified open wound, right lower leg, initial encounter: Secondary | ICD-10-CM | POA: Diagnosis not present

## 2020-07-09 DIAGNOSIS — R0609 Other forms of dyspnea: Secondary | ICD-10-CM

## 2020-07-09 DIAGNOSIS — Z79899 Other long term (current) drug therapy: Secondary | ICD-10-CM

## 2020-07-09 DIAGNOSIS — R0600 Dyspnea, unspecified: Secondary | ICD-10-CM

## 2020-07-09 DIAGNOSIS — R079 Chest pain, unspecified: Secondary | ICD-10-CM

## 2020-07-09 LAB — BASIC METABOLIC PANEL
BUN/Creatinine Ratio: 11 (ref 9–20)
BUN: 9 mg/dL (ref 6–24)
CO2: 20 mmol/L (ref 20–29)
Calcium: 8.7 mg/dL (ref 8.7–10.2)
Chloride: 107 mmol/L — ABNORMAL HIGH (ref 96–106)
Creatinine, Ser: 0.84 mg/dL (ref 0.76–1.27)
Glucose: 94 mg/dL (ref 65–99)
Potassium: 4.1 mmol/L (ref 3.5–5.2)
Sodium: 139 mmol/L (ref 134–144)
eGFR: 105 mL/min/{1.73_m2} (ref >59)

## 2020-07-09 NOTE — Telephone Encounter (Signed)
-----   Message from Lennon Alstrom, PA-C sent at 07/08/2020  4:39 PM EST ----- Regarding: LHC I spoke with this pt and consented him for [facility] for progressive dyspnea and recent chest pain (Dr. Kirke Corin).

## 2020-07-09 NOTE — Progress Notes (Addendum)
Jeff Rios, Jeff Rios ([id number]) Visit Report for 07/09/2020 Chief Complaint Document Details Patient Name: Jeff Rios, Jeff Rios Date of Service: 07/09/2020 8:30 AM Medical Record Number: [id number] Patient Account Number: [id number] Date of Birth/Sex: 1965-11-07 (55 y.o. M) Treating RN: Rogers BlockerSanchez, Kenia Primary Care Provider: PATIENT, NO Other Clinician: Lolita CramBurnette, Kyara Referring Provider: Cam Haiarr, Jacob Treating Provider/Extender: Rowan BlaseStone, Blease Capaldi Weeks in Treatment: 4 Information Obtained from: Patient Chief Complaint Right LE Electronic Signature(s) Signed: 07/09/2020 8:38:55 AM By: Lenda KelpStone III, Sergi Gellner PA-C Entered By: Lenda KelpStone III, Jimmye Wisnieski on 07/09/2020 08:38:55 Jeff Rios, Jeff Rios ([id number]) -------------------------------------------------------------------------------- HPI Details Patient Name: Jeff Rios, Abir Date of Service: 07/09/2020 8:30 AM Medical Record Number: [id number] Patient Account Number: [id number] Date of Birth/Sex: 1965-11-07 (55 y.o. M) Treating RN: Rogers BlockerSanchez, Kenia Primary Care Provider: PATIENT, NO Other Clinician: Lolita CramBurnette, Kyara Referring Provider: Cam Haiarr, Jacob Treating Provider/Extender: Rowan BlaseStone, Mikeala Girdler Weeks in Treatment: 4 History of Present Illness HPI Description: 06/10/2020 upon evaluation today patient presents for initial evaluation here in our clinic concerning issue with a wound which began on 29 December. Subsequently the patient tells me that this is a work comp related injury that he was working at [employer] when one of the bundles of cardboard boxes that they compile unfortunately fell off of a what sounds to be dolly struck him in the back of the leg. He initially had a small laceration. Unfortunately this developed into a much bigger issue as noted today where there is necrotic tissue in the base of the wound as well. There is a lot of swelling around the edges of the wound as well. It obviously seems like this turned into a hematoma which is not farfetched considering the  fact that the patient is on long-term anticoagulant therapy secondary to his cardiac history. He does have a history of myocardial infarction even at such a young age. He is on Eliquis I do believe at this point. Subsequently the patient states that he does have some discomfort here is not excruciating but nonetheless it is problematic for him. 06/17/2020 upon evaluation today patient appears to be doing better in regard to his wound on the posterior aspect of his right leg. He has been tolerating the dressing changes without complication even in 1 week's time I do see a significant improvement here which is great news. There does not appear to be any evidence of active infection at this point which is excellent. Overall I am extremely pleased with how things seem to be progressing. 06/25/2020 upon evaluation today patient appears to be doing well with regard to his wound. This is measuring smaller and looks to be doing much better I do [facility]. 07/02/2020 upon evaluation today patient appears to be making excellent progress at this point. Fortunately there is no sign of active infection which is great news and in general I am extremely pleased with where things stand today. No fevers, chills, nausea, vomiting, or diarrhea. 07/09/2020 upon evaluation today patient appears to be doing well with regard to his leg ulcer. This is measuring much better and overall seems to be doing excellent. There is no evidence of active infection at this time. No fevers, chills, nausea, vomiting, or diarrhea. Electronic Signature(s) Signed: 07/09/2020 8:49:18 AM By: Lenda KelpStone III, Doniven Vanpatten PA-C Entered By: Lenda KelpStone III, Aileana Hodder on 07/09/2020 08:49:18 Jeff Rios, Jeff Rios ([id number]) -------------------------------------------------------------------------------- Physical Exam Details Patient Name: Jeff Rios, Jeff Rios Date of Service: 07/09/2020 8:30 AM Medical Record Number: [id number] Patient Account  Number: [id number] Date of Birth/Sex: 1965-11-07 (55 y.o. M) Treating RN: Mordecai MaesSanchez,  Francisco.Grad Primary Care Provider: PATIENT, NO Other Clinician: Lolita Cram Referring Provider: Cam Hai Treating Provider/Extender: Rowan Blase in Treatment: 4 Constitutional Well-nourished and well-hydrated in no acute distress. Respiratory normal breathing without difficulty. Psychiatric this patient is able to make decisions and demonstrates good insight into disease process. Alert and Oriented x 3. pleasant and cooperative. Notes Patient's wound bed showed signs of good granulation and epithelization. There is no significant hyper granulation at this point which is great news. With that being said I am seeing a lot of the dressing sticking to the wound bed which does have any kind of concern. I think we may want to switch to silver alginate dressing. Electronic Signature(s) Signed: 07/09/2020 9:09:18 AM By: Lenda Kelp PA-C Entered By: Lenda Kelp on 07/09/2020 09:09:18 Jeff Ricks ([id number]) -------------------------------------------------------------------------------- Physician Orders Details Patient Name: Jeff Ricks Date of Service: 07/09/2020 8:30 AM Medical Record Number: [id number] Patient Account Number: [id number] Date of Birth/Sex: Jun 29, 1965 (55 y.o. M) Treating RN: Rogers Blocker Primary Care Provider: PATIENT, NO Other Clinician: Lolita Cram Referring Provider: Cam Hai Treating Provider/Extender: Rowan Blase in Treatment: 4 Verbal / Phone Orders: No Diagnosis Coding ICD-10 Coding Code Description S81.801A Unspecified open wound, right lower leg, initial encounter L97.812 Non-pressure chronic ulcer of other part of right lower leg with fat layer exposed Z79.01 Long term (current) use of anticoagulants I25.10 Atherosclerotic heart disease of native coronary artery without angina pectoris Follow-up Appointments Wound #1 Right,Posterior  Lower Leg o Return Appointment in 1 week. Bathing/ Shower/ Hygiene Wound #1 Right,Posterior Lower Leg o May shower with wound dressing protected with water repellent cover or cast protector. Edema Control - Lymphedema / Segmental Compressive Device / Other Right Lower Extremity o Optional: One layer of unna paste to top of compression wrap (to act as an anchor). o 3 Layer Compression System for Lymphedema. o Elevate, Exercise Daily and Avoid Standing for Long Periods of Time. o Elevate legs to the level of the heart and pump ankles as often as possible o Elevate leg(s) parallel to the floor when sitting. Additional Orders / Instructions Wound #1 Right,Posterior Lower Leg o OK to return to work with the following restrictions: - 2-3 days a week, 5 hours a day Wound Treatment Wound #1 - Lower Leg Wound Laterality: Right, Posterior Cleanser: Normal Saline 1 x Per Week/30 Days Discharge Instructions: Wash your hands with soap and water. Remove old dressing, discard into plastic bag and place into trash. Cleanse the wound with Normal Saline prior to applying a clean dressing using gauze sponges, not tissues or cotton balls. Do not scrub or use excessive force. Pat dry using gauze sponges, not tissue or cotton balls. Peri-Wound Care: Moisturizing Lotion 1 x Per Week/30 Days Discharge Instructions: Suggestions: Theraderm, Eucerin, Cetaphil, or patient preference. Primary Dressing: Silvercel Small 2x2 (in/in) 1 x Per Week/30 Days Discharge Instructions: Apply Silvercel Small 2x2 (in/in) as instructed Secondary Dressing: ABD Pad 5x9 (in/in) 1 x Per Week/30 Days Discharge Instructions: Cover with ABD pad Compression Wrap: Profore Lite LF 3 Multilayer Compression Bandaging System 1 x Per Week/30 Days Discharge Instructions: Apply 3 multi-layer wrap as prescribed. Electronic Signature(s) Signed: 07/10/2020 8:14:32 AM By: Lenda Kelp PA-C Signed: 07/10/2020 9:05:11 AM By: Phillis Haggis, Dondra Prader RN [place], [place] ([id number]) Entered By: Lajean Manes on 07/09/2020 08:48:22 Jeff Ricks ([id number]) -------------------------------------------------------------------------------- Problem List Details Patient Name: Jeff Ricks Date of Service: 07/09/2020 8:30 AM Medical Record Number: [id number] Patient Account Number: [id number] Date of Birth/Sex: [birth year] (54  y.o. M) Treating RN: Rogers Blocker Primary Care Provider: PATIENT, NO Other Clinician: Lolita Cram Referring Provider: Cam Hai Treating Provider/Extender: Rowan Blase in Treatment: 4 Active Problems ICD-10 Encounter Code Description Active Date MDM Diagnosis S81.801A Unspecified open wound, right lower leg, initial encounter 06/10/2020 No Yes L97.812 Non-pressure chronic ulcer of other part of right lower leg with fat layer 06/10/2020 No Yes exposed Z79.01 Long term (current) use of anticoagulants 06/10/2020 No Yes I25.10 Atherosclerotic heart disease of native coronary artery without angina 06/10/2020 No Yes pectoris Inactive Problems Resolved Problems Electronic Signature(s) Signed: 07/09/2020 8:38:49 AM By: Lenda Kelp PA-C Entered By: Lenda Kelp on 07/09/2020 08:38:48 Jeff Ricks ([id number]) -------------------------------------------------------------------------------- Progress Note Details Patient Name: Jeff Ricks Date of Service: 07/09/2020 8:30 AM Medical Record Number: [id number] Patient Account Number: [id number] Date of Birth/Sex: 1965/06/06 (55 y.o. M) Treating RN: Rogers Blocker Primary Care Provider: PATIENT, NO Other Clinician: Lolita Cram Referring Provider: Cam Hai Treating Provider/Extender: Rowan Blase in Treatment: 4 Subjective Chief Complaint Information obtained from Patient Right LE History of Present Illness (HPI) 06/10/2020 upon evaluation today patient presents for initial evaluation here in our clinic  concerning issue with a wound which began on 29 December. Subsequently the patient tells me that this is a work comp related injury that he was working at [employer] when one of the bundles of cardboard boxes that they compile unfortunately fell off of a what sounds to be dolly struck him in the back of the leg. He initially had a small laceration. Unfortunately this developed into a much bigger issue as noted today where there is necrotic tissue in the base of the wound as well. There is a lot of swelling around the edges of the wound as well. It obviously seems like this turned into a hematoma which is not farfetched considering the fact that the patient is on long-term anticoagulant therapy secondary to his cardiac history. He does have a history of myocardial infarction even at such a young age. He is on Eliquis I do believe at this point. Subsequently the patient states that he does have some discomfort here is not excruciating but nonetheless it is problematic for him. 06/17/2020 upon evaluation today patient appears to be doing better in regard to his wound on the posterior aspect of his right leg. He has been tolerating the dressing changes without complication even in 1 week's time I do see a significant improvement here which is great news. There does not appear to be any evidence of active infection at this point which is excellent. Overall I am extremely pleased with how things seem to be progressing. 06/25/2020 upon evaluation today patient appears to be doing well with regard to his wound. This is measuring smaller and looks to be doing much better I do think the [facility] is doing a good job. 07/02/2020 upon evaluation today patient appears to be making excellent progress at this point. Fortunately there is no sign of active infection which is great news and in general I am extremely pleased with where things stand today. No fevers, chills, nausea, vomiting, or diarrhea. 07/09/2020  upon evaluation today patient appears to be doing well with regard to his leg ulcer. This is measuring much better and overall seems to be doing excellent. There is no evidence of active infection at this time. No fevers, chills, nausea, vomiting, or diarrhea. Objective Constitutional Well-nourished and well-hydrated in no acute distress. Vitals Time Taken: 8:30 AM, Height: 63 in, Weight: 182  lbs, BMI: 32.2, Temperature: 97.8 F, Pulse: 70 bpm, Respiratory Rate: 16 breaths/min, Blood Pressure: 118/76 mmHg. Respiratory normal breathing without difficulty. Psychiatric this patient is able to make decisions and demonstrates good insight into disease process. Alert and Oriented x 3. pleasant and cooperative. General Notes: Patient's wound bed showed signs of good granulation and epithelization. There is no significant hyper granulation at this point which is great news. With that being said I am seeing a lot of the dressing sticking to the wound bed which does have any kind of concern. I think we may want to switch to silver alginate dressing. Integumentary (Hair, Skin) Wound #1 status is Open. Original cause of wound was Trauma. The date acquired was: 05/01/2020. The wound has been in treatment 4 weeks. The wound is located on the Right,Posterior Lower Leg. The wound measures 2.2cm length x 0.1cm width x 0.1cm depth; 0.173cm^2 area and 0.017cm^3 volume. There is Fat Layer (Subcutaneous Tissue) exposed. There is no tunneling or undermining noted. There is a medium amount of serosanguineous drainage noted. The wound margin is flat and intact. There is large (67-100%) red, hyper - granulation within the wound bed. There is no necrotic tissue within the wound bed. HENRI, BAUMLER ([id number]) Assessment Active Problems ICD-10 Unspecified open wound, right lower leg, initial encounter Non-pressure chronic ulcer of other part of right lower leg with fat layer exposed Long term (current) use of  anticoagulants Atherosclerotic heart disease of native coronary artery without angina pectoris Procedures Wound #1 Pre-procedure diagnosis of Wound #1 is a Trauma, Other located on the Right,Posterior Lower Leg . There was a Three Layer Compression Therapy Procedure with a pre-treatment ABI of 1.2 by Rogers Blocker, RN. Post procedure Diagnosis Wound #1: Same as Pre-Procedure Plan Follow-up Appointments: Wound #1 Right,Posterior Lower Leg: Return Appointment in 1 week. Bathing/ Shower/ Hygiene: Wound #1 Right,Posterior Lower Leg: May shower with wound dressing protected with water repellent cover or cast protector. Edema Control - Lymphedema / Segmental Compressive Device / Other: Optional: One layer of unna paste to top of compression wrap (to act as an anchor). 3 Layer Compression System for Lymphedema. Elevate, Exercise Daily and Avoid Standing for Long Periods of Time. Elevate legs to the level of the heart and pump ankles as often as possible Elevate leg(s) parallel to the floor when sitting. Additional Orders / Instructions: Wound #1 Right,Posterior Lower Leg: OK to return to work with the following restrictions: - 2-3 days a week, 5 hours a day WOUND #1: - Lower Leg Wound Laterality: Right, Posterior Cleanser: Normal Saline 1 x Per Week/30 Days Discharge Instructions: Wash your hands with soap and water. Remove old dressing, discard into plastic bag and place into trash. Cleanse the wound with Normal Saline prior to applying a clean dressing using gauze sponges, not tissues or cotton balls. Do not scrub or use excessive force. Pat dry using gauze sponges, not tissue or cotton balls. Peri-Wound Care: Moisturizing Lotion 1 x Per Week/30 Days Discharge Instructions: Suggestions: Theraderm, Eucerin, Cetaphil, or patient preference. Primary Dressing: Silvercel Small 2x2 (in/in) 1 x Per Week/30 Days Discharge Instructions: Apply Silvercel Small 2x2 (in/in) as instructed Secondary  Dressing: ABD Pad 5x9 (in/in) 1 x Per Week/30 Days Discharge Instructions: Cover with ABD pad Compression Wrap: Profore Lite LF 3 Multilayer Compression Bandaging System 1 x Per Week/30 Days Discharge Instructions: Apply 3 multi-layer wrap as prescribed. 1. Would recommend currently that we go ahead and continue with the wound care measures as before and the patient is  in agreement with the plan. This includes the use of the compression wrap. We have been utilizing a 3 layer compression wrap currently and I think that is doing a great job. 2. I am also can switch to a silver alginate dressing in hopes this will not stick as badly to the wound bed. 3. I am also can recommend that the patient continue to monitor for any signs of overall worsening infection. If anything occurs such as increased pain he should let me know soon as possible. We will see patient back for reevaluation in 1 week here in the clinic. If anything worsens or changes patient will contact our office for additional recommendations. Patient's work restrictions remain the same. He is able to work and just with the limitations as noted in his attached note to the record. BETHEL, GAGLIO ([id number]) Electronic Signature(s) Signed: 07/09/2020 9:10:43 AM By: Lenda Kelp PA-C Entered By: Lenda Kelp on 07/09/2020 09:10:43 Jeff Ricks ([id number]) -------------------------------------------------------------------------------- SuperBill Details Patient Name: Jeff Ricks Date of Service: 07/09/2020 Medical Record Number: [id number] Patient Account Number: [id number] Date of Birth/Sex: November 12, 1965 (55 y.o. M) Treating RN: Rogers Blocker Primary Care Provider: PATIENT, NO Other Clinician: Lolita Cram Referring Provider: Cam Hai Treating Provider/Extender: Rowan Blase in Treatment: 4 Diagnosis Coding ICD-10 Codes Code Description [phone] Unspecified open wound, right lower leg, initial  encounter L97.812 Non-pressure chronic ulcer of other part of right lower leg with fat layer exposed Z79.01 Long term (current) use of anticoagulants I25.10 Atherosclerotic heart disease of native coronary artery without angina pectoris Facility Procedures CPT4 Code: [id number] Description: (Facility Use Only) [phone] - APPLY MULTLAY COMPRS LWR RT LEG Modifier: Quantity: 1 Physician Procedures CPT4 Code: [id number] Description: 99213 - WC PHYS LEVEL 3 - EST PT Modifier: Quantity: 1 CPT4 Code: Description: ICD-10 Diagnosis Description S81.801A Unspecified open wound, right lower leg, initial encounter L97.812 Non-pressure chronic ulcer of other part of right lower leg with fat la Z79.01 Long term (current) use of anticoagulants I25.10  Atherosclerotic heart disease of native coronary artery without angina Modifier: yer exposed pectoris Quantity: Electronic Signature(s) Signed: 07/09/2020 9:10:55 AM By: Lenda Kelp PA-C Entered By: Lenda Kelp on 07/09/2020 09:10:55

## 2020-07-09 NOTE — Telephone Encounter (Signed)
Attempted to call pt to review lab results and provider's recc.  No answer. Unable to leave vm at this time.  

## 2020-07-09 NOTE — Telephone Encounter (Signed)
Attempted to call pt to discuss scheduling left heart cath. No answer. Voicemail is full. Will attempt to call pt again.   Left heart cath Dr. Kirke [facility] Dx: Chest pain / Dyspnea  When pt returns call will ask if he is available to have done Monday 07/15/20 at [facility].  Pt will also NEED: CBC to be drawn at Medical mall same day as when he has Covid Test done.   Instructions to review with pt below (Complete date/time after scheduled):  You are scheduled for a Cardiac Catheterization on ______________________ with Dr. Lorine Bears.    1.  Please arrive at the [facility] entrance of [facility] at _______________  AM (This time is one hour before your procedure to ensure your preparation). Free valet parking service is available.   Special note: Every effort is made to have your procedure done on time. Please understand that emergencies sometimes delay scheduled procedures.  2. Diet: Do not eat solid foods after midnight.  The patient may have clear liquids until 5am upon the day of the procedure.   3. Labs: You will need to have blood drawn on _______________ for CBC.  You do not need to be fasting.  4. Medication instructions in preparation for your procedure:   Contrast Allergy: No   On the morning of your procedure, take your Aspirin and Brilinta/Ticagrelor and any morning medicines NOT listed above.  You may use sips of water.  5. Plan for one night stay--bring personal belongings. 6. Bring a current list of your medications and current insurance cards. 7. You MUST have a responsible person to drive you home. 8. Someone MUST be with you the first 24 hours after you arrive home or your discharge will be delayed. 9. Please wear clothes that are easy to get on and off and wear slip-on shoes.  Thank you for allowing [place] to care for you!   -- [facility]  COVID PRE- TEST: You will need a COVID TEST prior to the procedure:  LOCATION: [facility]  Pre-Op Admission Drive-Thru Testing site.  DATE/TIME:  ________________ (anytime between 8 am and 1 pm)

## 2020-07-09 NOTE — Telephone Encounter (Signed)
-----   Message from Lennon Alstrom, PA-C sent at 07/09/2020  3:06 PM EST ----- Good news!  Kidney function stable from previous labs.  Electrolytes now at goal.  Pre-cath labs reassuring.  Recommendations: Given his dyspnea reported, trial 2 days of torsemide 10mg  with Kcl tab x1 (only 1 dose during that time) and repeat BMET at time of labs prior to catheterization.

## 2020-07-09 NOTE — Telephone Encounter (Signed)
Attempted to call pt again, no answer. Vm is full.

## 2020-07-10 ENCOUNTER — Telehealth: Payer: Self-pay | Admitting: *Deleted

## 2020-07-10 MED ORDER — TORSEMIDE 10 MG PO TABS: 10.0000 mg | ORAL_TABLET | Freq: Every day | ORAL | 0 refills | Status: DC

## 2020-07-10 MED ORDER — POTASSIUM CHLORIDE ER 20 MEQ PO TBCR: 20.0000 meq | EXTENDED_RELEASE_TABLET | Freq: Once | ORAL | 0 refills | Status: DC

## 2020-07-10 NOTE — Telephone Encounter (Signed)
Letter left at front desk for pt to pick up - cardiac cath instructions.

## 2020-07-10 NOTE — Telephone Encounter (Signed)
Spoke to pt, notified will need to schedule left heart cath over the phone. Discussed in clinic and pt agreeable to schedule.   Pt will also need CBC lab and repeat Bmet (see message below) to be done on 3/16 when he comes for pre-procedure Covid test (2 days before cath). Also advised pt come by our office on the same day as repeat labs/covid test to pick up written cath procedure instructions. Pt states on the phone that he has nothing to write with, and does not have MyChart, so he will need to pick up these instructions. We did review instructions verbally over the phone and pt verbalized understanding.   Pt reports transportation issues and does call Zenaida Niece for transportation. He does confirm that 1 week away will be sufficient for him to set up the following:  Heart cath on 3/18 and repeat labs/Covid test/pick up cath instructions at our office 3/16.   Also reviewed recent lab results per Leafy Kindle, PA:  Good news!  Kidney function stable from previous labs.  Electrolytes now at goal.  Pre-cath labs reassuring.   Recommendations:  Given his dyspnea reported, trial 2 days of torsemide 10mg  with Kcl tab x1 (only 1 dose during that time) and repeat BMET at time of labs prior to catheterization.   Pt verbalized understanding. Rx for Torsemide and Kcl sent to [facility] on [address] per pt request.  He understands repeat labs will be completed when he returns to [facility] on 3/16 and then will need to have Covid test (explained to pt he may ask at help desk for direction to new location to covid site since pt does not have any way to write what I am saying down). Pre-Admit testing office, [address] in the [facility] located on the [facility] campus at [address] [place], [place].    You are scheduled for a Cardiac Catheterization on Friday 07/19/20 with Dr. 07/21/20.    1.  Please arrive at the [facility] entrance of [facility]  at 7:30 AM (This time is one hour before your procedure to ensure your preparation). Free valet parking service is available.   Special note: Every effort is made to have your procedure done on time. Please understand that emergencies sometimes delay scheduled procedures.  2. Diet: Do not eat solid foods after midnight.  The patient may have clear liquids until 5am upon the day of the procedure.   3. Labs: You will need to have blood drawn on Wednesday 07/17/20 for CBC and repeat BMet.  You do not need to be fasting.                -  Please go to the [facility]. You will check in at the front desk to the right as you walk into the atrium. Valet Parking is                   offered if needed.  No appointment needed. You may go any day between 7 am and 6 pm.  COVID PRE- TEST: You will need a COVID TEST prior to the procedure:   LOCATION: Pre-Admit testing office, [address] in the [facility]                                   located on the [facility] campus  at [address] [place], [place] [address]   DATE/TIME:  07/17/20 (anytime between 8 am and 2 pm)    4. Medication instructions in preparation for your procedure:   Contrast Allergy: No   On the morning of your procedure, take your Aspirin and Clopidogrel/Plavix and any morning medicines NOT listed above.  You may use sips of water.  5. Plan for one night stay--bring personal belongings. 6. Bring a current list of your medications and current insurance cards. 7. You MUST have a responsible person to drive you home. 8. Someone MUST be with you the first 24 hours after you arrive home or your discharge will be delayed. 9. Please wear clothes that are easy to get on and off and wear slip-on shoes.  Thank you for allowing [place] to care for you!   -- [facility]  Instructions left at front desk for pt pick up. Pt has no  further questions at this time.

## 2020-07-10 NOTE — Progress Notes (Signed)
Jeff Rios, Jeff Rios ([id number]) Visit Report for 07/09/2020 Arrival Information Details Patient Name: Jeff, Rios Date of Service: 07/09/2020 8:30 AM Medical Record Number: [id number] Patient Account Number: [id number] Date of Birth/Sex: [birth year] (56 y.o. M) Treating RN: Carlene Coria Primary Care Elden Brucato: PATIENT, NO Other Clinician: Jeanine Luz Referring Jannett Schmall: Roland Rack Treating Vidal Lampkins/Extender: Skipper Cliche in Treatment: 4 Visit Information History Since Last Visit All ordered tests and consults were completed: No Patient Arrived: Ambulatory Added or deleted any medications: No Arrival Time: 08:26 Any new allergies or adverse reactions: No Accompanied By: self Had a fall or experienced change in No Transfer Assistance: None activities of daily living that may affect Patient Identification Verified: Yes risk of falls: Secondary Verification Process Completed: Yes Signs or symptoms of abuse/neglect since last visito No Patient Requires Transmission-Based No Hospitalized since last visit: No Precautions: Implantable device outside of the clinic excluding No Patient Has Alerts: Yes cellular tissue based products placed in the center Patient Alerts: Patient on Blood since last visit: Thinner Has Dressing in Place as Prescribed: Yes PLAVIX Pain Present Now: No Electronic Signature(s) Signed: 07/10/2020 12:09:52 PM By: Carlene Coria RN Entered By: Carlene Coria on 07/09/2020 08:30:08 Jeff Rios ([id number]) -------------------------------------------------------------------------------- Clinic Level of Care Assessment Details Patient Name: Jeff Rios Date of Service: 07/09/2020 8:30 AM Medical Record Number: [id number] Patient Account Number: [id number] Date of Birth/Sex: [birth year] (55 y.o. M) Treating RN: Dolan Amen Primary Care Cartez Mogle: PATIENT, NO Other Clinician: Jeanine Luz Referring Barre Aydelott: Roland Rack Treating  Dazani Norby/Extender: Skipper Cliche in Treatment: 4 Clinic Level of Care Assessment Items TOOL 1 Quantity Score _0  - Use when EandM and Procedure is performed on INITIAL visit 0 ASSESSMENTS - Nursing Assessment / Reassessment _1  - General Physical Exam (combine w/ comprehensive assessment (listed just below) when performed on new 0 pt. evals) _2  - 0 Comprehensive Assessment (HX, ROS, Risk Assessments, Wounds Hx, etc.) ASSESSMENTS - Wound and Skin Assessment / Reassessment _3  - Dermatologic / Skin Assessment (not related to wound area) 0 ASSESSMENTS - Ostomy and/or Continence Assessment and Care _4  - Incontinence Assessment and Management 0 _5  - 0 Ostomy Care Assessment and Management (repouching, etc.) PROCESS - Coordination of Care _6  - Simple Patient / Family Education for ongoing care 0 _7  - 0 Complex (extensive) Patient / Family Education for ongoing care _8  - 0 Staff obtains Programmer, systems, Records, Test Results / Process Orders _9  - 0 Staff telephones HHA, Nursing Homes / Clarify orders / etc _10  - 0 Routine Transfer to another Facility (non-emergent condition) _11  - 0 Routine Hospital Admission (non-emergent condition) _12  - 0 New Admissions / Biomedical engineer / Ordering NPWT, Apligraf, etc. _13  - 0 Emergency Hospital Admission (emergent condition) PROCESS - Special Needs _14  - Pediatric / Minor Patient Management 0 _15  - 0 Isolation Patient Management _16  - 0 Hearing / Language / Visual special needs _17  - 0 Assessment of Community assistance (transportation, D/C planning, etc.) _18  - 0 Additional assistance / Altered mentation _19  - 0 Support Surface(s) Assessment (bed, cushion, seat, etc.) INTERVENTIONS - Miscellaneous _20  - External ear exam 0 _21  - 0 Patient Transfer (multiple staff / Civil Service fast streamer / Similar devices) _22  - 0 Simple Staple / Suture removal (25 or less) _23  - 0 Complex Staple / Suture removal (26 or more) _24  - 0 Hypo/Hyperglycemic Management (do not  check if billed separately) _25  - 0 Ankle / Brachial Index (ABI) - do not check if billed separately Has the patient been seen at the hospital within the last  three years: Yes Total Score: 0 Level Of Care: ____ Jeff Rios ([id number]) Electronic Signature(s) Signed: 07/10/2020 9:05:11 AM By: Phillis Haggis, Dondra Prader RN Entered By: Phillis Haggis, Dondra Prader on 07/09/2020 08:47:02 Jeff Rios ([id number]) -------------------------------------------------------------------------------- Compression Therapy Details Patient Name: Jeff Rios Date of Service: 07/09/2020 8:30 AM Medical Record Number: [id number] Patient Account Number: [id number] Date of Birth/Sex: 10-15-1965 (55 y.o. M) Treating RN: Rogers Blocker Primary Care Riggins Cisek: PATIENT, NO Other Clinician: Lolita Cram Referring Bobbe Quilter: Cam Hai Treating Mahir Prabhakar/Extender: Rowan Blase in Treatment: 4 Compression Therapy Performed for Wound Assessment: Wound #1 Right,Posterior Lower Leg Performed By: Ovidio Hanger, RN Compression Type: Three Layer Pre Treatment ABI: 1.2 Post Procedure Diagnosis Same as Pre-procedure Electronic Signature(s) Signed: 07/10/2020 9:05:11 AM By: Phillis Haggis, Dondra Prader RN Entered By: Phillis Haggis, Dondra Prader on 07/09/2020 08:46:04 Jeff Rios ([id number]) -------------------------------------------------------------------------------- Encounter Discharge Information Details Patient Name: Jeff Rios Date of Service: 07/09/2020 8:30 AM Medical Record Number: [id number] Patient Account Number: [id number] Date of Birth/Sex: 09/13/1965 (55 y.o. M) Treating RN: Rogers Blocker Primary Care Carlena Ruybal: PATIENT, NO Other Clinician: Lolita Cram Referring Luken Shadowens: Cam Hai Treating Torah Pinnock/Extender: Rowan Blase in Treatment: 4 Encounter Discharge Information Items Discharge Condition: Stable Ambulatory Status: Ambulatory Discharge Destination:  Home Transportation: Private Auto Accompanied By: caseworker Schedule Follow-up Appointment: Yes Clinical Summary of Care: Electronic Signature(s) Signed: 07/10/2020 9:05:11 AM By: Phillis Haggis, Dondra Prader RN Entered By: Phillis Haggis, Dondra Prader on 07/09/2020 09:04:30 Jeff Rios ([id number]) -------------------------------------------------------------------------------- Lower Extremity Assessment Details Patient Name: Jeff Rios Date of Service: 07/09/2020 8:30 AM Medical Record Number: [id number] Patient Account Number: [id number] Date of Birth/Sex: July 02, 1965 (54 y.o. M) Treating RN: Yevonne Pax Primary Care Ellar Hakala: PATIENT, NO Other Clinician: Lolita Cram Referring Heatherly Stenner: Cam Hai Treating Melaysia Streed/Extender: Rowan Blase in Treatment: 4 Edema Assessment Assessed: [Left: No] [Right: No] [Left: Edema] [Right: :] Calf Left: Right: Point of Measurement: 30 cm From Medial Instep 33 cm Ankle Left: Right: Point of Measurement: 10 cm From Medial Instep 22 cm Electronic Signature(s) Signed: 07/10/2020 12:09:52 PM By: Yevonne Pax RN Entered By: Yevonne Pax on 07/09/2020 08:40:07 Jeff Rios ([id number]) -------------------------------------------------------------------------------- Multi Wound Chart Details Patient Name: Jeff Rios Date of Service: 07/09/2020 8:30 AM Medical Record Number: [id number] Patient Account Number: [id number] Date of Birth/Sex: 09-11-1965 (55 y.o. M) Treating RN: Rogers Blocker Primary Care Ercel Normoyle: PATIENT, NO Other Clinician: Lolita Cram Referring Addysin Porco: Cam Hai Treating Dearius Hoffmann/Extender: Rowan Blase in Treatment: 4 Vital Signs Height(in): 63 Pulse(bpm): 70 Weight(lbs): 182 Blood Pressure(mmHg): 118/76 Body Mass Index(BMI): 32 Temperature(F): 97.8 Respiratory Rate(breaths/min): 16 Photos: [1:No Photos] [N/A:N/A] Wound Location: [1:Right, Posterior Lower Leg] [N/A:N/A] Wounding Event:  [1:Trauma] [N/A:N/A] Primary Etiology: [1:Trauma, Other] [N/A:N/A] Comorbid History: [1:Coronary Artery Disease, Hypertension, Myocardial Infarction] [N/A:N/A] Date Acquired: [1:05/01/2020] [N/A:N/A] Weeks of Treatment: [1:4] [N/A:N/A] Wound Status: [1:Open] [N/A:N/A] Measurements L x W x D (cm) [1:2.2x0.1x0.1] [N/A:N/A] Area (cm) : [1:0.173] [N/A:N/A] Volume (cm) : [1:0.017] [N/A:N/A] % Reduction in Area: [1:97.70%] [N/A:N/A] % Reduction in Volume: [1:99.70%] [N/A:N/A] Classification: [1:Full Thickness Without Exposed Support Structures] [N/A:N/A] Exudate Amount: [1:Medium] [N/A:N/A] Exudate Type: [1:Serosanguineous] [N/A:N/A] Exudate Color: [1:red, brown] [N/A:N/A] Wound Margin: [1:Flat and Intact] [N/A:N/A] Granulation Amount: [1:Large (67-100%)] [N/A:N/A] Granulation Quality: [1:Red, Hyper-granulation] [N/A:N/A] Necrotic Amount: [1:None Present (0%)] [N/A:N/A] Exposed Structures: [1:Fat Layer (Subcutaneous Tissue): Yes Fascia: No Tendon: No Muscle: No Joint: No Bone: No Small (1-33%)] [N/A:N/A N/A] Treatment Notes Electronic Signature(s) Signed: 07/10/2020 9:05:11 AM By: Phillis Haggis, Dondra Prader RN Entered By: Phillis Haggis, Dondra Prader on 07/09/2020 08:44:36 Jeff Rios ([id number]) -------------------------------------------------------------------------------- Multi-Disciplinary Care Plan  Details Patient Name: Jeff Rios, Jeff Rios Date of Service: 07/09/2020 8:30 AM Medical Record Number: [id number] Patient Account Number: [id number] Date of Birth/Sex: Feb 19, 1966 (55 y.o. M) Treating RN: Dolan Amen Primary Care Selah Klang: PATIENT, NO Other Clinician: Jeanine Luz Referring Areli Frary: Roland Rack Treating Indiana Gamero/Extender: Skipper Cliche in Treatment: 4 Active Inactive Wound/Skin Impairment Nursing Diagnoses: Knowledge deficit related to ulceration/compromised skin integrity Goals: Patient/caregiver will verbalize understanding of skin care regimen Date Initiated:  06/10/2020 Date Inactivated: 07/02/2020 Target Resolution Date: 07/08/2020 Goal Status: Met Ulcer/skin breakdown will have a volume reduction of 30% by week 4 Date Initiated: 06/10/2020 Target Resolution Date: 07/08/2020 Goal Status: Active Ulcer/skin breakdown will have a volume reduction of 50% by week 8 Date Initiated: 06/10/2020 Target Resolution Date: 08/08/2020 Goal Status: Active Ulcer/skin breakdown will have a volume reduction of 80% by week 12 Date Initiated: 06/10/2020 Target Resolution Date: 09/07/2020 Goal Status: Active Ulcer/skin breakdown will heal within 14 weeks Date Initiated: 06/10/2020 Target Resolution Date: 10/08/2020 Goal Status: Active Interventions: Assess patient/caregiver ability to obtain necessary supplies Assess patient/caregiver ability to perform ulcer/skin care regimen upon admission and as needed Assess ulceration(s) every visit Notes: Electronic Signature(s) Signed: 07/10/2020 9:05:11 AM By: Georges Mouse, Minus Breeding RN Entered By: Georges Mouse, Minus Breeding on 07/09/2020 08:44:19 Jeff Rios ([id number]) -------------------------------------------------------------------------------- Pain Assessment Details Patient Name: Jeff Rios Date of Service: 07/09/2020 8:30 AM Medical Record Number: [id number] Patient Account Number: [id number] Date of Birth/Sex: [birth year] (55 y.o. M) Treating RN: Carlene Coria Primary Care Hassel Uphoff: PATIENT, NO Other Clinician: Jeanine Luz Referring Eleno Weimar: Roland Rack Treating Jordin Dambrosio/Extender: Skipper Cliche in Treatment: 4 Active Problems Location of Pain Severity and Description of Pain Patient Has Paino No Site Locations Pain Management and Medication Current Pain Management: Electronic Signature(s) Signed: 07/10/2020 12:09:52 PM By: Carlene Coria RN Entered By: Carlene Coria on 07/09/2020 08:30:31 Jeff Rios  ([id number]) -------------------------------------------------------------------------------- Patient/Caregiver Education Details Patient Name: Jeff Rios Date of Service: 07/09/2020 8:30 AM Medical Record Number: [id number] Patient Account Number: [id number] Date of Birth/Gender: [birth year] (55 y.o. M) Treating RN: Dolan Amen Primary Care Physician: PATIENT, NO Other Clinician: Jeanine Luz Referring Physician: Roland Rack Treating Physician/Extender: Skipper Cliche in Treatment: 4 Education Assessment Education Provided To: Patient Education Topics Provided Wound/Skin Impairment: Methods: Explain/Verbal Responses: State content correctly Notes New dressing education Electronic Signature(s) Signed: 07/10/2020 9:05:11 AM By: Georges Mouse, Minus Breeding RN Entered By: Georges Mouse, Minus Breeding on 07/09/2020 08:47:35 Jeff Rios ([id number]) -------------------------------------------------------------------------------- Wound Assessment Details Patient Name: Jeff Rios Date of Service: 07/09/2020 8:30 AM Medical Record Number: [id number] Patient Account Number: [id number] Date of Birth/Sex: [birth year] (54 y.o. M) Treating RN: Carlene Coria Primary Care Reylene Stauder: PATIENT, NO Other Clinician: Jeanine Luz Referring Tiernan Suto: Roland Rack Treating Kash Davie/Extender: Skipper Cliche in Treatment: 4 Wound Status Wound Number: 1 Primary Trauma, Other Etiology: Wound Location: Right, Posterior Lower Leg Wound Status: Open Wounding Event: Trauma Comorbid Coronary Artery Disease, Hypertension, Myocardial Date Acquired: 05/01/2020 History: Infarction Weeks Of Treatment: 4 Clustered Wound: No Photos Wound Measurements Length: (cm) 2.2 Width: (cm) 0.1 Depth: (cm) 0.1 Area: (cm) 0.173 Volume: (cm) 0.017 % Reduction in Area: 97.7% % Reduction in Volume: 99.7% Epithelialization: Small (1-33%) Tunneling: No Undermining: No Wound  Description Classification: Full Thickness Without Exposed Support Structures Wound Margin: Flat and Intact Exudate Amount: Medium Exudate Type: Serosanguineous Exudate Color: red, brown Foul Odor After Cleansing: No Slough/Fibrino No Wound Bed Granulation Amount: Large (67-100%) Exposed Structure Granulation Quality: Red, Hyper-granulation Fascia Exposed: No Necrotic Amount: None Present (0%) Fat Layer (Subcutaneous Tissue) Exposed: Yes Tendon  Exposed: No Muscle Exposed: No Joint Exposed: No Bone Exposed: No Treatment Notes Wound #1 (Lower Leg) Wound Laterality: Right, Posterior Cleanser Normal Saline Discharge Instruction: Wash your hands with soap and water. Remove old dressing, discard into plastic bag and place into trash. Cleanse the wound with Normal Saline prior to applying a clean dressing using gauze sponges, not tissues or cotton balls. Do not scrub or use excessive force. Pat dry using gauze sponges, not tissue or cotton balls. TOMMEY, BARRET ([id number]) Peri-Wound Care Topical Primary Dressing Silvercel Small 2x2 (in/in) Quantity: 1 Discharge Instruction: Apply Silvercel Small 2x2 (in/in) as instructed Secondary Dressing ABD Pad 5x9 (in/in) Quantity: 1 Discharge Instruction: Cover with ABD pad Secured With Compression Wrap Profore Lite LF 3 Multilayer Compression Bandaging System Quantity: 1 Discharge Instruction: Apply 3 multi-layer wrap as prescribed. Compression Stockings Add-Ons Electronic Signature(s) Signed: 07/10/2020 12:09:52 PM By: Carlene Coria RN Entered By: Carlene Coria on 07/09/2020 08:46:06 Jeff Rios ([id number]) -------------------------------------------------------------------------------- Vitals Details Patient Name: Jeff Rios Date of Service: 07/09/2020 8:30 AM Medical Record Number: [id number] Patient Account Number: [id number] Date of Birth/Sex: [birth year] (55 y.o. M) Treating RN: Carlene Coria Primary Care Afreen Siebels:  PATIENT, NO Other Clinician: Jeanine Luz Referring Gustava Berland: Roland Rack Treating Carsynn Bethune/Extender: Skipper Cliche in Treatment: 4 Vital Signs Time Taken: 08:30 Temperature (F): 97.8 Height (in): 63 Pulse (bpm): 70 Weight (lbs): 182 Respiratory Rate (breaths/min): 16 Body Mass Index (BMI): 32.2 Blood Pressure (mmHg): 118/76 Reference Range: 80 - 120 mg / dl Electronic Signature(s) Signed: 07/10/2020 12:09:52 PM By: Carlene Coria RN Entered By: Carlene Coria on 07/09/2020 08:30:25

## 2020-07-10 NOTE — Telephone Encounter (Signed)
See additional telephone note from today for details: The patient has been notified of the result and verbalized understanding.  All questions (if any) were answered.

## 2020-07-16 ENCOUNTER — Encounter: Payer: Medicare Other | Admitting: Physician Assistant

## 2020-07-16 ENCOUNTER — Other Ambulatory Visit: Payer: Self-pay

## 2020-07-16 DIAGNOSIS — S81801A Unspecified open wound, right lower leg, initial encounter: Secondary | ICD-10-CM | POA: Diagnosis not present

## 2020-07-16 NOTE — Progress Notes (Addendum)
BENTLEY, FISSEL ([id number]) Visit Report for 07/16/2020 Chief Complaint Document Details Patient Name: Jeff Rios, Jeff Rios Date of Service: 07/16/2020 9:15 AM Medical Record Number: [id number] Patient Account Number: [id number] Date of Birth/Sex: 14-Feb-1966 (55 y.o. M) Treating RN: Rogers Blocker Primary Care Provider: PATIENT, NO Other Clinician: Lolita Cram Referring Provider: Cam Hai Treating Provider/Extender: Rowan Blase in Treatment: 5 Information Obtained from: Patient Chief Complaint Right LE Electronic Signature(s) Signed: 07/16/2020 9:01:59 AM By: Lenda Kelp PA-C Entered By: Lenda Kelp on 07/16/2020 09:01:59 Jeff Rios ([id number]) -------------------------------------------------------------------------------- HPI Details Patient Name: Jeff Rios Date of Service: 07/16/2020 9:15 AM Medical Record Number: [id number] Patient Account Number: [id number] Date of Birth/Sex: 1966/04/18 (54 y.o. M) Treating RN: Rogers Blocker Primary Care Provider: PATIENT, NO Other Clinician: Lolita Cram Referring Provider: Cam Hai Treating Provider/Extender: Rowan Blase in Treatment: 5 History of Present Illness HPI Description: 06/10/2020 upon evaluation today patient presents for initial evaluation here in our clinic concerning issue with a wound which began on 29 December. Subsequently the patient tells me that this is a work comp related injury that he was working at [employer] when one of the bundles of cardboard boxes that they compile unfortunately fell off of a what sounds to be dolly struck him in the back of the leg. He initially had a small laceration. Unfortunately this developed into a much bigger issue as noted today where there is necrotic tissue in the base of the wound as well. There is a lot of swelling around the edges of the wound as well. It obviously seems like this turned into a hematoma which is not farfetched considering  the fact that the patient is on long-term anticoagulant therapy secondary to his cardiac history. He does have a history of myocardial infarction even at such a young age. He is on Eliquis I do believe at this point. Subsequently the patient states that he does have some discomfort here is not excruciating but nonetheless it is problematic for him. 06/17/2020 upon evaluation today patient appears to be doing better in regard to his wound on the posterior aspect of his right leg. He has been tolerating the dressing changes without complication even in 1 week's time I do see a significant improvement here which is great news. There does not appear to be any evidence of active infection at this point which is excellent. Overall I am extremely pleased with how things seem to be progressing. 06/25/2020 upon evaluation today patient appears to be doing well with regard to his wound. This is measuring smaller and looks to be doing much better I do think the [facility] is doing a good job. 07/02/2020 upon evaluation today patient appears to be making excellent progress at this point. Fortunately there is no sign of active infection which is great news and in general I am extremely pleased with where things stand today. No fevers, chills, nausea, vomiting, or diarrhea. 07/09/2020 upon evaluation today patient appears to be doing well with regard to his leg ulcer. This is measuring much better and overall seems to be doing excellent. There is no evidence of active infection at this time. No fevers, chills, nausea, vomiting, or diarrhea. 07/16/2020 upon evaluation today patient appears to be doing well today in regard to his wound. He has been tolerating the dressing changes without complication. Fortunately there is no signs of active infection at this time. No fevers, chills, nausea, vomiting, or diarrhea. Electronic Signature(s) Signed: 07/16/2020 10:21:44 AM By: Lenda Kelp PA-C Entered  By: Lenda KelpStone III, Itzayanna Kaster  on 07/16/2020 10:21:44 Jeff Rios, Jeff Rios ([id number]) -------------------------------------------------------------------------------- Physical Exam Details Patient Name: Jeff Rios, Jeff Rios Date of Service: 07/16/2020 9:15 AM Medical Record Number: [id number] Patient Account Number: [id number] Date of Birth/Sex: Nov 19, 1965 (55 y.o. M) Treating RN: Rogers BlockerSanchez, Kenia Primary Care Provider: PATIENT, NO Other Clinician: Lolita CramBurnette, Kyara Referring Provider: Cam Haiarr, Jacob Treating Provider/Extender: Rowan BlaseStone, Johnathen Testa Weeks in Treatment: 5 Constitutional Well-nourished and well-hydrated in no acute distress. Respiratory normal breathing without difficulty. Psychiatric this patient is able to make decisions and demonstrates good insight into disease process. Alert and Oriented x 3. pleasant and cooperative. Notes Upon inspection patient's wound bed actually showed signs of good granulation epithelization. There does not appear to be any evidence of active infection which is great news overall very pleased in that regard. Electronic Signature(s) Signed: 07/16/2020 10:21:58 AM By: Lenda KelpStone III, Veva Grimley PA-C Entered By: Lenda KelpStone III, Makhai Fulco on 07/16/2020 10:21:57 Jeff Rios, Jeff Rios ([id number]) -------------------------------------------------------------------------------- Physician Orders Details Patient Name: Jeff Rios, Jeff Rios Date of Service: 07/16/2020 9:15 AM Medical Record Number: [id number] Patient Account Number: [id number] Date of Birth/Sex: Nov 19, 1965 (54 y.o. M) Treating RN: Rogers BlockerSanchez, Kenia Primary Care Provider: PATIENT, NO Other Clinician: Lolita CramBurnette, Kyara Referring Provider: Cam Haiarr, Jacob Treating Provider/Extender: Rowan BlaseStone, Brylynn Hanssen Weeks in Treatment: 5 Verbal / Phone Orders: No Diagnosis Coding ICD-10 Coding Code Description S81.801A Unspecified open wound, right lower leg, initial encounter L97.812 Non-pressure chronic ulcer of other part of right lower leg with fat layer exposed Z79.01 Long term  (current) use of anticoagulants I25.10 Atherosclerotic heart disease of native coronary artery without angina pectoris Follow-up Appointments o Return Appointment in 1 week. Bathing/ Shower/ Hygiene o May shower with wound dressing protected with water repellent cover or cast protector. Edema Control - Lymphedema / Segmental Compressive Device / Other Right Lower Extremity o Optional: One layer of unna paste to top of compression wrap (to act as an anchor). o 3 Layer Compression System for Lymphedema. o Elevate, Exercise Daily and Avoid Standing for Long Periods of Time. o Elevate legs to the level of the heart and pump ankles as often as possible o Elevate leg(s) parallel to the floor when sitting. Additional Orders / Instructions Wound #1 Right,Posterior Lower Leg o OK to return to work with the following restrictions: - 2-3 days a week, 5 hours a day Wound Treatment Wound #1 - Lower Leg Wound Laterality: Right, Posterior Cleanser: Normal Saline 1 x Per Week/30 Days Discharge Instructions: Wash your hands with soap and water. Remove old dressing, discard into plastic bag and place into trash. Cleanse the wound with Normal Saline prior to applying a clean dressing using gauze sponges, not tissues or cotton balls. Do not scrub or use excessive force. Pat dry using gauze sponges, not tissue or cotton balls. Peri-Wound Care: Desitin Maximum Strength Ointment 4 (oz) 1 x Per Week/30 Days Peri-Wound Care: Moisturizing Lotion 1 x Per Week/30 Days Discharge Instructions: Suggestions: Theraderm, Eucerin, Cetaphil, or patient preference. Primary Dressing: Hydrofera Blue Ready Transfer Foam, 2.5x2.5 (in/in) 1 x Per Week/30 Days Discharge Instructions: Apply Hydrofera Blue Ready to wound bed as directed Secondary Dressing: ABD Pad 5x9 (in/in) 1 x Per Week/30 Days Discharge Instructions: Cover with ABD pad Compression Wrap: Profore Lite LF 3 Multilayer Compression Bandaging System 1  x Per Week/30 Days Discharge Instructions: Apply 3 multi-layer wrap as prescribed. Electronic Signature(s) Signed: 07/16/2020 4:39:22 PM By: Lenda KelpStone III, Makyah Lavigne PA-C Signed: 07/16/2020 4:53:32 PM By: Phillis HaggisSanchez Pereyda, Dondra PraderKenia RN Entered By: Phillis HaggisSanchez Pereyda, Dondra PraderKenia on 07/16/2020 10:08:51 Jeff Rios, Jeff Rios ([id number]) SweenyWATLINGTON, Jeff GallowayMICKEY ([id number]) --------------------------------------------------------------------------------  Problem List Details Patient Name: Jeff Rios, Jeff Rios Date of Service: 07/16/2020 9:15 AM Medical Record Number: [id number] Patient Account Number: [id number] Date of Birth/Sex: 1966/03/04 (55 y.o. M) Treating RN: Rogers Blocker Primary Care Provider: PATIENT, NO Other Clinician: Lolita Cram Referring Provider: Cam Hai Treating Provider/Extender: Rowan Blase in Treatment: 5 Active Problems ICD-10 Encounter Code Description Active Date MDM Diagnosis S81.801A Unspecified open wound, right lower leg, initial encounter 06/10/2020 No Yes L97.812 Non-pressure chronic ulcer of other part of right lower leg with fat layer 06/10/2020 No Yes exposed Z79.01 Long term (current) use of anticoagulants 06/10/2020 No Yes I25.10 Atherosclerotic heart disease of native coronary artery without angina 06/10/2020 No Yes pectoris Inactive Problems Resolved Problems Electronic Signature(s) Signed: 07/16/2020 9:01:53 AM By: Lenda Kelp PA-C Entered By: Lenda Kelp on 07/16/2020 09:01:52 Jeff Rios ([id number]) -------------------------------------------------------------------------------- Progress Note Details Patient Name: Jeff Rios Date of Service: 07/16/2020 9:15 AM Medical Record Number: [id number] Patient Account Number: [id number] Date of Birth/Sex: [birth year] (54 y.o. M) Treating RN: Rogers Blocker Primary Care Provider: PATIENT, NO Other Clinician: Lolita Cram Referring Provider: Cam Hai Treating Provider/Extender: Rowan Blase in  Treatment: 5 Subjective Chief Complaint Information obtained from Patient Right LE History of Present Illness (HPI) 06/10/2020 upon evaluation today patient presents for initial evaluation here in our clinic concerning issue with a wound which began on 29 December. Subsequently the patient tells me that this is a work comp related injury that he was working at [employer] when one of the bundles of cardboard boxes that they compile unfortunately fell off of a what sounds to be dolly struck him in the back of the leg. He initially had a small laceration. Unfortunately this developed into a much bigger issue as noted today where there is necrotic tissue in the base of the wound as well. There is a lot of swelling around the edges of the wound as well. It obviously seems like this turned into a hematoma which is not farfetched considering the fact that the patient is on long-term anticoagulant therapy secondary to his cardiac history. He does have a history of myocardial infarction even at such a young age. He is on Eliquis I do believe at this point. Subsequently the patient states that he does have some discomfort here is not excruciating but nonetheless it is problematic for him. 06/17/2020 upon evaluation today patient appears to be doing better in regard to his wound on the posterior aspect of his right leg. He has been tolerating the dressing changes without complication even in 1 week's time I do see a significant improvement here which is great news. There does not appear to be any evidence of active infection at this point which is excellent. Overall I am extremely pleased with how things seem to be progressing. 06/25/2020 upon evaluation today patient appears to be doing well with regard to his wound. This is measuring smaller and looks to be doing much better I do think the [facility] is doing a good job. 07/02/2020 upon evaluation today patient appears to be making excellent progress at  this point. Fortunately there is no sign of active infection which is great news and in general I am extremely pleased with where things stand today. No fevers, chills, nausea, vomiting, or diarrhea. 07/09/2020 upon evaluation today patient appears to be doing well with regard to his leg ulcer. This is measuring much better and overall seems to be doing excellent. There is no evidence of active infection at this time.  No fevers, chills, nausea, vomiting, or diarrhea. 07/16/2020 upon evaluation today patient appears to be doing well today in regard to his wound. He has been tolerating the dressing changes without complication. Fortunately there is no signs of active infection at this time. No fevers, chills, nausea, vomiting, or diarrhea. Objective Constitutional Well-nourished and well-hydrated in no acute distress. Vitals Time Taken: 9:21 AM, Height: 63 in, Weight: 182 lbs, BMI: 32.2, Temperature: 97.9 F, Pulse: 67 bpm, Respiratory Rate: 16 breaths/min, Blood Pressure: 138/85 mmHg. Respiratory normal breathing without difficulty. Psychiatric this patient is able to make decisions and demonstrates good insight into disease process. Alert and Oriented x 3. pleasant and cooperative. General Notes: Upon inspection patient's wound bed actually showed signs of good granulation epithelization. There does not appear to be any evidence of active infection which is great news overall very pleased in that regard. Integumentary (Hair, Skin) Wound #1 status is Open. Original cause of wound was Trauma. The date acquired was: 05/01/2020. The wound has been in treatment 5 weeks. The wound is located on the Right,Posterior Lower Leg. The wound measures 2cm length x 0.9cm width x 0.1cm depth; 1.414cm^2 area and 0.141cm^3 volume. There is Fat Layer (Subcutaneous Tissue) exposed. There is a medium amount of serosanguineous drainage noted. The wound margin is flat and intact. There is large (67-100%) red, hyper -  granulation within the wound bed. There is no necrotic tissue within the Cresson, Jeff Rios ([id number]) wound bed. Assessment Active Problems ICD-10 Unspecified open wound, right lower leg, initial encounter Non-pressure chronic ulcer of other part of right lower leg with fat layer exposed Long term (current) use of anticoagulants Atherosclerotic heart disease of native coronary artery without angina pectoris Procedures Wound #1 Pre-procedure diagnosis of Wound #1 is a Trauma, Other located on the Right,Posterior Lower Leg . There was a Three Layer Compression Therapy Procedure with a pre-treatment ABI of 1.2 by Rogers Blocker, RN. Post procedure Diagnosis Wound #1: Same as Pre-Procedure Plan Follow-up Appointments: Return Appointment in 1 week. Bathing/ Shower/ Hygiene: May shower with wound dressing protected with water repellent cover or cast protector. Edema Control - Lymphedema / Segmental Compressive Device / Other: Optional: One layer of unna paste to top of compression wrap (to act as an anchor). 3 Layer Compression System for Lymphedema. Elevate, Exercise Daily and Avoid Standing for Long Periods of Time. Elevate legs to the level of the heart and pump ankles as often as possible Elevate leg(s) parallel to the floor when sitting. Additional Orders / Instructions: Wound #1 Right,Posterior Lower Leg: OK to return to work with the following restrictions: - 2-3 days a week, 5 hours a day WOUND #1: - Lower Leg Wound Laterality: Right, Posterior Cleanser: Normal Saline 1 x Per Week/30 Days Discharge Instructions: Wash your hands with soap and water. Remove old dressing, discard into plastic bag and place into trash. Cleanse the wound with Normal Saline prior to applying a clean dressing using gauze sponges, not tissues or cotton balls. Do not scrub or use excessive force. Pat dry using gauze sponges, not tissue or cotton balls. Peri-Wound Care: Desitin Maximum Strength Ointment  4 (oz) 1 x Per Week/30 Days Peri-Wound Care: Moisturizing Lotion 1 x Per Week/30 Days Discharge Instructions: Suggestions: Theraderm, Eucerin, Cetaphil, or patient preference. Primary Dressing: Hydrofera Blue Ready Transfer Foam, 2.5x2.5 (in/in) 1 x Per Week/30 Days Discharge Instructions: Apply Hydrofera Blue Ready to wound bed as directed Secondary Dressing: ABD Pad 5x9 (in/in) 1 x Per Week/30 Days Discharge Instructions: Cover with ABD  pad Compression Wrap: Profore Lite LF 3 Multilayer Compression Bandaging System 1 x Per Week/30 Days Discharge Instructions: Apply 3 multi-layer wrap as prescribed. 1. Would recommend currently that we going continue with the wound care measures as before and the patient is in agreement the plan this includes the use of Hydrofera Blue. I do want to use some zinc around the edges of the wound. 2. I am also can recommend at this time that we have the patient going continue with the compression wrap I do believe the 3 layer compression wrap has been beneficial for him. 3. I am also can recommend he continue to elevate his legs much as possible when he is at home I think he can continue to work per the restrictions put in place. We will see patient back for reevaluation in 1 week here in the clinic. If anything worsens or changes patient will contact our office for additional recommendations. FENTON, CANDEE ([id number]) Electronic Signature(s) Signed: 07/16/2020 10:22:32 AM By: Lenda Kelp PA-C Entered By: Lenda Kelp on 07/16/2020 10:22:32 Jeff Rios ([id number]) -------------------------------------------------------------------------------- SuperBill Details Patient Name: Jeff Rios Date of Service: 07/16/2020 Medical Record Number: [id number] Patient Account Number: [id number] Date of Birth/Sex: 03-19-1966 (54 y.o. M) Treating RN: Rogers Blocker Primary Care Provider: PATIENT, NO Other Clinician: Lolita Cram Referring  Provider: Cam Hai Treating Provider/Extender: Rowan Blase in Treatment: 5 Diagnosis Coding ICD-10 Codes Code Description [phone] Unspecified open wound, right lower leg, initial encounter L97.812 Non-pressure chronic ulcer of other part of right lower leg with fat layer exposed Z79.01 Long term (current) use of anticoagulants I25.10 Atherosclerotic heart disease of native coronary artery without angina pectoris Facility Procedures CPT4 Code: [id number] Description: (Facility Use Only) [phone] - APPLY MULTLAY COMPRS LWR RT LEG Modifier: Quantity: 1 Physician Procedures CPT4 Code: [id number] Description: 99213 - WC PHYS LEVEL 3 - EST PT Modifier: Quantity: 1 CPT4 Code: Description: ICD-10 Diagnosis Description S81.801A Unspecified open wound, right lower leg, initial encounter L97.812 Non-pressure chronic ulcer of other part of right lower leg with fat la Z79.01 Long term (current) use of anticoagulants I25.10  Atherosclerotic heart disease of native coronary artery without angina Modifier: yer exposed pectoris Quantity: Electronic Signature(s) Signed: 07/16/2020 10:22:42 AM By: Lenda Kelp PA-C Entered By: Lenda Kelp on 07/16/2020 10:22:42

## 2020-07-16 NOTE — Progress Notes (Addendum)
Jeff, Rios ([id number]) Visit Report for 07/16/2020 Arrival Information Details Patient Name: Jeff Rios, Jeff Rios Date of Service: 07/16/2020 9:15 AM Medical Record Number: [id number] Patient Account Number: [id number] Date of Birth/Sex: Jun 17, 1965 (55 y.o. M) Treating RN: Cornell Barman Primary Care Melayah Skorupski: PATIENT, NO Other Clinician: Jeanine Luz Referring Jeran Hiltz: Roland Rack Treating Mcclellan Demarais/Extender: Skipper Cliche in Treatment: 5 Visit Information History Since Last Visit Added or deleted any medications: No Patient Arrived: Ambulatory Has Dressing in Place as Prescribed: Yes Arrival Time: 09:20 Pain Present Now: No Accompanied By: case worker Transfer Assistance: None Patient Identification Verified: Yes Secondary Verification Process Completed: Yes Patient Requires Transmission-Based No Precautions: Patient Has Alerts: Yes Patient Alerts: Patient on Blood Thinner PLAVIX Electronic Signature(s) Signed: 07/17/2020 6:32:03 PM By: Gretta Cool, BSN, RN, CWS, Kim RN, BSN Entered By: Gretta Cool, BSN, RN, CWS, Kim on 07/16/2020 09:21:29 Jeff Rios ([id number]) -------------------------------------------------------------------------------- Clinic Level of Care Assessment Details Patient Name: Jeff Rios Date of Service: 07/16/2020 9:15 AM Medical Record Number: [id number] Patient Account Number: [id number] Date of Birth/Sex: 11-17-1965 (54 y.o. M) Treating RN: Dolan Amen Primary Care Kwali Wrinkle: PATIENT, NO Other Clinician: Jeanine Luz Referring Marna Weniger: Roland Rack Treating Elecia Serafin/Extender: Skipper Cliche in Treatment: 5 Clinic Level of Care Assessment Items TOOL 1 Quantity Score '[]'  - Use when EandM and Procedure is performed on INITIAL visit 0 ASSESSMENTS - Nursing Assessment / Reassessment '[]'  - General Physical Exam (combine w/ comprehensive assessment (listed just below) when performed on new 0 pt. evals) '[]'  - 0 Comprehensive Assessment  (HX, ROS, Risk Assessments, Wounds Hx, etc.) ASSESSMENTS - Wound and Skin Assessment / Reassessment '[]'  - Dermatologic / Skin Assessment (not related to wound area) 0 ASSESSMENTS - Ostomy and/or Continence Assessment and Care '[]'  - Incontinence Assessment and Management 0 '[]'  - 0 Ostomy Care Assessment and Management (repouching, etc.) PROCESS - Coordination of Care '[]'  - Simple Patient / Family Education for ongoing care 0 '[]'  - 0 Complex (extensive) Patient / Family Education for ongoing care '[]'  - 0 Staff obtains Programmer, systems, Records, Test Results / Process Orders '[]'  - 0 Staff telephones HHA, Nursing Homes / Clarify orders / etc '[]'  - 0 Routine Transfer to another Facility (non-emergent condition) '[]'  - 0 Routine Hospital Admission (non-emergent condition) '[]'  - 0 New Admissions / Biomedical engineer / Ordering NPWT, Apligraf, etc. '[]'  - 0 Emergency Hospital Admission (emergent condition) PROCESS - Special Needs '[]'  - Pediatric / Minor Patient Management 0 '[]'  - 0 Isolation Patient Management '[]'  - 0 Hearing / Language / Visual special needs '[]'  - 0 Assessment of Community assistance (transportation, D/C planning, etc.) '[]'  - 0 Additional assistance / Altered mentation '[]'  - 0 Support Surface(s) Assessment (bed, cushion, seat, etc.) INTERVENTIONS - Miscellaneous '[]'  - External ear exam 0 '[]'  - 0 Patient Transfer (multiple staff / Civil Service fast streamer / Similar devices) '[]'  - 0 Simple Staple / Suture removal (25 or less) '[]'  - 0 Complex Staple / Suture removal (26 or more) '[]'  - 0 Hypo/Hyperglycemic Management (do not check if billed separately) '[]'  - 0 Ankle / Brachial Index (ABI) - do not check if billed separately Has the patient been seen at the hospital within the last three years: Yes Total Score: 0 Level Of Care: ____ Jeff Rios ([id number]) Electronic Signature(s) Signed: 07/16/2020 4:53:32 PM By: Georges Mouse, Minus Breeding RN Entered By: Georges Mouse, Minus Breeding on 07/16/2020  10:08:56 Jeff Rios ([id number]) -------------------------------------------------------------------------------- Compression Therapy Details Patient Name: Jeff Rios Date of Service: 07/16/2020 9:15 AM Medical Record Number: [id number] Patient Account Number: [id number] Date  of Birth/Sex: Aug 28, 1965 (55 y.o. M) Treating RN: Dolan Amen Primary Care Jakerria Kingbird: PATIENT, NO Other Clinician: Jeanine Luz Referring Darius Lundberg: Roland Rack Treating Markel Kurtenbach/Extender: Skipper Cliche in Treatment: 5 Compression Therapy Performed for Wound Assessment: Wound #1 Right,Posterior Lower Leg Performed By: Cora Daniels, RN Compression Type: Three Layer Pre Treatment ABI: 1.2 Post Procedure Diagnosis Same as Pre-procedure Electronic Signature(s) Signed: 07/16/2020 4:53:32 PM By: Georges Mouse, Minus Breeding RN Entered By: Georges Mouse, Minus Breeding on 07/16/2020 10:07:36 Jeff Rios ([id number]) -------------------------------------------------------------------------------- Encounter Discharge Information Details Patient Name: Jeff Rios Date of Service: 07/16/2020 9:15 AM Medical Record Number: [id number] Patient Account Number: [id number] Date of Birth/Sex: Jun 30, 1965 (55 y.o. M) Treating RN: Dolan Amen Primary Care Santina Trillo: PATIENT, NO Other Clinician: Jeanine Luz Referring Lovenia Debruler: Roland Rack Treating Tiran Sauseda/Extender: Skipper Cliche in Treatment: 5 Encounter Discharge Information Items Discharge Condition: Stable Ambulatory Status: Ambulatory Discharge Destination: Home Transportation: Private Auto Accompanied By: case worker Schedule Follow-up Appointment: Yes Clinical Summary of Care: Electronic Signature(s) Signed: 07/16/2020 4:53:32 PM By: Georges Mouse, Minus Breeding RN Entered By: Georges Mouse, Minus Breeding on 07/16/2020 10:09:38 Jeff Rios  ([id number]) -------------------------------------------------------------------------------- Lower Extremity Assessment Details Patient Name: Jeff Rios Date of Service: 07/16/2020 9:15 AM Medical Record Number: [id number] Patient Account Number: [id number] Date of Birth/Sex: October 19, 1965 (54 y.o. M) Treating RN: Cornell Barman Primary Care Ainhoa Rallo: PATIENT, NO Other Clinician: Jeanine Luz Referring Junnie Loschiavo: Roland Rack Treating Nayzeth Altman/Extender: Skipper Cliche in Treatment: 5 Edema Assessment Assessed: [Left: No] [Right: No] [Left: Edema] [Right: :] Calf Left: Right: Point of Measurement: 30 cm From Medial Instep 33.4 cm Ankle Left: Right: Point of Measurement: 10 cm From Medial Instep 23 cm Vascular Assessment Pulses: Dorsalis Pedis Palpable: [Right:Yes] Electronic Signature(s) Signed: 07/17/2020 6:32:03 PM By: Gretta Cool, BSN, RN, CWS, Kim RN, BSN Entered By: Gretta Cool, BSN, RN, CWS, Kim on 07/16/2020 09:33:41 Jeff Rios ([id number]) -------------------------------------------------------------------------------- Multi Wound Chart Details Patient Name: Jeff Rios Date of Service: 07/16/2020 9:15 AM Medical Record Number: [id number] Patient Account Number: [id number] Date of Birth/Sex: [birth year] (54 y.o. M) Treating RN: Dolan Amen Primary Care Jahni Paul: PATIENT, NO Other Clinician: Jeanine Luz Referring Bradden Tadros: Roland Rack Treating Althea Backs/Extender: Skipper Cliche in Treatment: 5 Vital Signs Height(in): 63 Pulse(bpm): 67 Weight(lbs): 182 Blood Pressure(mmHg): 138/85 Body Mass Index(BMI): 32 Temperature(F): 97.9 Respiratory Rate(breaths/min): 16 Photos: [N/A:N/A] Wound Location: Right, Posterior Lower Leg N/A N/A Wounding Event: Trauma N/A N/A Primary Etiology: Trauma, Other N/A N/A Comorbid History: Coronary Artery Disease, N/A N/A Hypertension, Myocardial Infarction Date Acquired: 05/01/2020 N/A N/A Weeks of Treatment: 5 N/A  N/A Wound Status: Open N/A N/A Measurements L x W x D (cm) 2x0.9x0.1 N/A N/A Area (cm) : 1.414 N/A N/A Volume (cm) : 0.141 N/A N/A % Reduction in Area: 81.40% N/A N/A % Reduction in Volume: 97.40% N/A N/A Classification: Full Thickness Without Exposed N/A N/A Support Structures Exudate Amount: Medium N/A N/A Exudate Type: Serosanguineous N/A N/A Exudate Color: red, brown N/A N/A Wound Margin: Flat and Intact N/A N/A Granulation Amount: Large (67-100%) N/A N/A Granulation Quality: Red, Hyper-granulation N/A N/A Necrotic Amount: None Present (0%) N/A N/A Exposed Structures: Fat Layer (Subcutaneous Tissue): N/A N/A Yes Fascia: No Tendon: No Muscle: No Joint: No Bone: No Epithelialization: Small (1-33%) N/A N/A Treatment Notes Electronic Signature(s) Signed: 07/16/2020 4:53:32 PM By: Georges Mouse, Minus Breeding RN Entered By: Georges Mouse, Minus Breeding on 07/16/2020 10:07:11 Jeff Rios ([id number]) -------------------------------------------------------------------------------- Multi-Disciplinary Care Plan Details Patient Name: Jeff Rios Date of Service: 07/16/2020 9:15 AM Medical Record Number: [id number] Patient Account Number: [id number] Date of Birth/Sex:  April 09, 1966 (55 y.o. M) Treating RN: Dolan Amen Primary Care Carel Carrier: PATIENT, NO Other Clinician: Jeanine Luz Referring Wanna Gully: Roland Rack Treating Harsha Yusko/Extender: Skipper Cliche in Treatment: 5 Active Inactive Wound/Skin Impairment Nursing Diagnoses: Knowledge deficit related to ulceration/compromised skin integrity Goals: Patient/caregiver will verbalize understanding of skin care regimen Date Initiated: 06/10/2020 Date Inactivated: 07/02/2020 Target Resolution Date: 07/08/2020 Goal Status: Met Ulcer/skin breakdown will have a volume reduction of 30% by week 4 Date Initiated: 06/10/2020 Date Inactivated: 07/16/2020 Target Resolution Date: 07/08/2020 Goal Status: Met Ulcer/skin breakdown will  have a volume reduction of 50% by week 8 Date Initiated: 06/10/2020 Target Resolution Date: 08/08/2020 Goal Status: Active Ulcer/skin breakdown will have a volume reduction of 80% by week 12 Date Initiated: 06/10/2020 Target Resolution Date: 09/07/2020 Goal Status: Active Ulcer/skin breakdown will heal within 14 weeks Date Initiated: 06/10/2020 Target Resolution Date: 10/08/2020 Goal Status: Active Interventions: Assess patient/caregiver ability to obtain necessary supplies Assess patient/caregiver ability to perform ulcer/skin care regimen upon admission and as needed Assess ulceration(s) every visit Notes: Electronic Signature(s) Signed: 07/16/2020 4:53:32 PM By: Georges Mouse, Minus Breeding RN Entered By: Georges Mouse, Minus Breeding on 07/16/2020 10:07:02 Jeff Rios ([id number]) -------------------------------------------------------------------------------- Pain Assessment Details Patient Name: Jeff Rios Date of Service: 07/16/2020 9:15 AM Medical Record Number: [id number] Patient Account Number: [id number] Date of Birth/Sex: Jul 01, 1965 (55 y.o. M) Treating RN: Cornell Barman Primary Care Lyrick Lagrand: PATIENT, NO Other Clinician: Jeanine Luz Referring Kailynn Satterly: Roland Rack Treating Evora Schechter/Extender: Skipper Cliche in Treatment: 5 Active Problems Location of Pain Severity and Description of Pain Patient Has Paino No Site Locations Pain Management and Medication Current Pain Management: Notes Patient denies pain at this time. Electronic Signature(s) Signed: 07/17/2020 6:32:03 PM By: Gretta Cool, BSN, RN, CWS, Kim RN, BSN Entered By: Gretta Cool, BSN, RN, CWS, Kim on 07/16/2020 09:22:41 Jeff Rios ([id number]) -------------------------------------------------------------------------------- Patient/Caregiver Education Details Patient Name: Jeff Rios Date of Service: 07/16/2020 9:15 AM Medical Record Number: [id number] Patient Account Number: [id number] Date of Birth/Gender:  [birth year] (54 y.o. M) Treating RN: Dolan Amen Primary Care Physician: PATIENT, NO Other Clinician: Jeanine Luz Referring Physician: Roland Rack Treating Physician/Extender: Skipper Cliche in Treatment: 5 Education Assessment Education Provided To: Patient Education Topics Provided Wound/Skin Impairment: Methods: Explain/Verbal Responses: State content correctly Electronic Signature(s) Signed: 07/16/2020 4:53:32 PM By: Georges Mouse, Minus Breeding RN Entered By: Georges Mouse, Minus Breeding on 07/16/2020 10:09:11 Jeff Rios ([id number]) -------------------------------------------------------------------------------- Wound Assessment Details Patient Name: Jeff Rios Date of Service: 07/16/2020 9:15 AM Medical Record Number: [id number] Patient Account Number: [id number] Date of Birth/Sex: 08-24-1965 (55 y.o. M) Treating RN: Cornell Barman Primary Care Carolynne Schuchard: PATIENT, NO Other Clinician: Jeanine Luz Referring Maaliyah Adolph: Roland Rack Treating Lyra Alaimo/Extender: Skipper Cliche in Treatment: 5 Wound Status Wound Number: 1 Primary Trauma, Other Etiology: Wound Location: Right, Posterior Lower Leg Wound Status: Open Wounding Event: Trauma Comorbid Coronary Artery Disease, Hypertension, Myocardial Date Acquired: 05/01/2020 History: Infarction Weeks Of Treatment: 5 Clustered Wound: No Photos Wound Measurements Length: (cm) 2 Width: (cm) 0.9 Depth: (cm) 0.1 Area: (cm) 1.414 Volume: (cm) 0.141 % Reduction in Area: 81.4% % Reduction in Volume: 97.4% Epithelialization: Small (1-33%) Wound Description Classification: Full Thickness Without Exposed Support Structures Wound Margin: Flat and Intact Exudate Amount: Medium Exudate Type: Serosanguineous Exudate Color: red, brown Foul Odor After Cleansing: No Slough/Fibrino No Wound Bed Granulation Amount: Large (67-100%) Exposed Structure Granulation Quality: Red, Hyper-granulation Fascia Exposed: No Necrotic  Amount: None Present (0%) Fat Layer (Subcutaneous Tissue) Exposed: Yes Tendon Exposed: No Muscle Exposed: No Joint Exposed: No Bone Exposed: No Treatment Notes  Wound #1 (Lower Leg) Wound Laterality: Right, Posterior Cleanser Normal Saline Discharge Instruction: Wash your hands with soap and water. Remove old dressing, discard into plastic bag and place into trash. Cleanse the wound with Normal Saline prior to applying a clean dressing using gauze sponges, not tissues or cotton balls. Do not scrub or use excessive force. Pat dry using gauze sponges, not tissue or cotton balls. KIARA, KEEP ([id number]) Peri-Wound Care Desitin Maximum Strength Ointment 4 (oz) Moisturizing Lotion Discharge Instruction: Suggestions: Theraderm, Eucerin, Cetaphil, or patient preference. Topical Primary Dressing Hydrofera Blue Ready Transfer Foam, 2.5x2.5 (in/in) Discharge Instruction: Apply Hydrofera Blue Ready to wound bed as directed Secondary Dressing ABD Pad 5x9 (in/in) Discharge Instruction: Cover with ABD pad Secured With Compression Wrap Profore Lite LF 3 Multilayer Compression [place] Discharge Instruction: Apply 3 multi-layer wrap as prescribed. Compression Stockings Environmental education officer) Signed: 07/17/2020 6:32:03 PM By: Gretta Cool, BSN, RN, CWS, Kim RN, BSN Entered By: Gretta Cool, BSN, RN, CWS, Kim on 07/16/2020 09:32:12 Jeff Rios ([id number]) -------------------------------------------------------------------------------- [place] Details Patient Name: Jeff Rios Date of Service: 07/16/2020 9:15 AM Medical Record Number: [id number] Patient Account Number: [id number] Date of Birth/Sex: Apr 13, 1966 (54 y.o. M) Treating RN: Cornell Barman Primary Care Iaan Oregel: PATIENT, NO Other Clinician: Jeanine Luz Referring Shamra Bradeen: Roland Rack Treating Milton Streicher/Extender: Skipper Cliche in Treatment: 5 Vital Signs Time Taken: 09:21 Temperature (F): 97.9 Height  (in): 63 Pulse (bpm): 67 Weight (lbs): 182 Respiratory Rate (breaths/min): 16 Body Mass Index (BMI): 32.2 Blood Pressure (mmHg): 138/85 Reference Range: 80 - 120 mg / dl Electronic Signature(s) Signed: 07/17/2020 6:32:03 PM By: Gretta Cool, BSN, RN, CWS, Kim RN, BSN Entered By: Gretta Cool, BSN, RN, CWS, Kim on 07/16/2020 73:42:87

## 2020-07-17 ENCOUNTER — Inpatient Hospital Stay: Admission: RE | Admit: 2020-07-17 | Payer: Medicare Other | Source: Ambulatory Visit

## 2020-07-18 ENCOUNTER — Telehealth: Payer: Self-pay | Admitting: *Deleted

## 2020-07-18 DIAGNOSIS — I2510 Atherosclerotic heart disease of native coronary artery without angina pectoris: Secondary | ICD-10-CM

## 2020-07-18 DIAGNOSIS — I10 Essential (primary) hypertension: Secondary | ICD-10-CM

## 2020-07-18 NOTE — Telephone Encounter (Signed)
Spoke to pt this morning after noting pt did not arrive for pre procedure Covid test and CBC that was scheduled yesterday 3/16. Pt voiced that he did not have transportation. Also unable to arrive today for lab work/ Covid test d/t transportation issues.  Left heart cath was scheduled with Dr. Kirke Corin for tomorrow 3/18 at [facility].  Pt aware we will need to reschedule cath procedure. Spoke to scheduling and cancelled cath for 3/18.   Discussed compliance and transportation issues with provider and determined best plan for pt will be to have Covid test, CBC and review cath instructions same day at next follow up appointment.  Pt currently scheduled for 2 week follow up with Marisue Ivan, PA 07/22/20.  This appt rescheduled for Friday 07/26/20 with J[facility] - Will review cath instructions, draw CBC and send pt to Covid testing after visit.  Left heart cath RESCHEDULED for Monday 07/29/20 with Dr. Kirke Corin at [facility] at 0930 (arrival 0830).   [place] [employer] services have also been arranged for pt's office appt on 3/25 AND for cath procedure on 3/28.   Pt verbalized understanding and agreeable to the plan noted above.   Cath instructions listed below. Will add these to pt's AVS at next ov as well.     You are scheduled for a Cardiac Catheterization on Monday, March 28 with Dr. Lorine Bears.  1. Please arrive at the [facility] at 8:30 AM. This time is one hour prior to your procedure time. The address is [address] [place], [place] [address]. Free valet parking service is available.   Special note: Every effort is made to have your procedure done on time. Please understand that emergencies sometimes delay scheduled procedures.  2. Diet: Do not eat solid foods after midnight.  The patient may have clear liquids until 5am upon the day of the procedure.  3. Labs: You will have lab work (CBC) completed at office visit on 3/18. You have already had BMP previously.   4. Medication  instructions in preparation for your procedure:   Contrast Allergy: No  Do Not take Torsemide the morning of your test.  On the morning of your procedure, take your Aspirin and Plavix/Clopidogrel and any morning medicines NOT listed above.  You may use sips of water.  5. Plan for one night stay--bring personal belongings. 6. Bring a current list of your medications and current insurance cards. 7. You MUST have a responsible person to drive you home. 8. Someone MUST be with you the first 24 hours after you arrive home or your discharge will be delayed. 9. Please wear clothes that are easy to get on and off and wear slip-on shoes.  Thank you for allowing [place] to care for you!   -- [facility]

## 2020-07-19 ENCOUNTER — Telehealth: Payer: Self-pay | Admitting: General Practice

## 2020-07-19 ENCOUNTER — Ambulatory Visit: Admission: RE | Admit: 2020-07-19 | Payer: Medicare Other | Source: Home / Self Care | Admitting: Cardiovascular Disease

## 2020-07-19 ENCOUNTER — Encounter: Admission: RE | Payer: Self-pay | Source: Home / Self Care

## 2020-07-19 DIAGNOSIS — R0600 Dyspnea, unspecified: Secondary | ICD-10-CM

## 2020-07-19 DIAGNOSIS — R079 Chest pain, unspecified: Secondary | ICD-10-CM

## 2020-07-19 SURGERY — LEFT HEART CATH AND CORONARY ANGIOGRAPHY
Anesthesia: Moderate Sedation

## 2020-07-19 NOTE — Telephone Encounter (Signed)
Noted. Thank you for the update.

## 2020-07-19 NOTE — Telephone Encounter (Signed)
[facility] with [facility] [employer] calling .  They spoke with patient and he declined transport at this time as he has found his own transportation.    If further questions or assistance needed please call.

## 2020-07-19 NOTE — Telephone Encounter (Signed)
Patient calling back to confirm times and dates of upcoming appointments and procedures. Patient was made aware that [facility] Transportation has been arranged for both appointments. Patient states he is still going to call who he usually rides with to see availability and make [place] aware which he prefers afterward

## 2020-07-19 NOTE — Telephone Encounter (Signed)
   Awais Cobarrubias DOB: February 26, 1966 MRN: [id number]   RIDER WAIVER AND RELEASE OF LIABILITY  For purposes of improving physical access to our facilities, [facility] is pleased to partner with third parties to provide [place] patients or other authorized individuals the option of convenient, on-demand ground transportation services (the [employer]") through use of the technology service that enables users to request on-demand ground transportation from independent third-party providers.  By opting to use and accept these [employer], I, the undersigned, hereby agree on behalf of myself, and on behalf of any minor child using the [employer] for whom I am the parent or legal guardian, as follows:  1. Science writer provided to me are provided by independent third-party transportation providers who are not [employer] or employees and who are unaffiliated with [name]. 2. [facility] is neither a transportation carrier nor a common or public carrier. 3. [facility] has no control over the quality or safety of the transportation that occurs as a result of the [employer]. 4. [facility] cannot guarantee that any third-party transportation provider will complete any arranged transportation service. 5. [facility] makes no representation, warranty, or guarantee regarding the reliability, timeliness, quality, safety, suitability, or availability of any of the Transport Services or that they will be error free. 6. I fully understand that traveling by vehicle involves risks and dangers of serious bodily injury, including permanent disability, paralysis, and death. I agree, on behalf of myself and on behalf of any minor child using the Transport Services for whom I am the parent or legal guardian, that the entire risk arising out of my use of the [employer] remains solely with me, to the maximum extent permitted under applicable law. 7. The [employer] are provided "as is" and "as available." [facility] disclaims all representations and warranties, express, implied or statutory, not expressly set out in these terms, including the implied warranties of merchantability and fitness for a particular purpose. 8. I hereby waive and release [place] [facility], its agents, employees, officers, directors, representatives, insurers, attorneys, assigns, successors, subsidiaries, and affiliates from any and all past, present, or future claims, demands, liabilities, actions, causes of action, or suits of any kind directly or indirectly arising from acceptance and use of the [employer]. 9. I further waive and release [facility] and its affiliates from all present and future liability and responsibility for any injury or death to persons or damages to property caused by or related to the use of the [employer]. 10. I have read this Waiver and Release of Liability, and I understand the terms used in it and their legal significance. This Waiver is freely and voluntarily given with the understanding that my right (as well as the right of any minor child for whom I am the parent or legal guardian using the [employer]) to legal recourse against [place] in connection with the [employer] is knowingly surrendered in return for use of these services.   I attest that I read the consent document to [name], gave Mr. Ek the opportunity to ask questions and answered the questions asked (if any). I affirm that Ladarien Beeks then provided consent for he's participation in this program.     Launa Grill

## 2020-07-22 ENCOUNTER — Ambulatory Visit: Payer: Medicare Other | Admitting: Physician Assistant

## 2020-07-23 ENCOUNTER — Other Ambulatory Visit: Payer: Self-pay

## 2020-07-23 ENCOUNTER — Encounter: Payer: Medicare Other | Admitting: Physician Assistant

## 2020-07-23 DIAGNOSIS — S81801A Unspecified open wound, right lower leg, initial encounter: Secondary | ICD-10-CM | POA: Diagnosis not present

## 2020-07-23 NOTE — Progress Notes (Addendum)
Jeff Rios, Jeff Rios ([id number]) Visit Report for 07/23/2020 Chief Complaint Document Details Patient Name: Jeff Rios, Jeff Rios Date of Service: 07/23/2020 9:15 AM Medical Record Number: [id number] Patient Account Number: [id number] Date of Birth/Sex: 11/09/1965 (55 y.o. M) Treating RN: Rogers Blocker Primary Care Provider: PATIENT, NO Other Clinician: Lolita Cram Referring Provider: Cam Hai Treating Provider/Extender: Rowan Blase in Treatment: 6 Information Obtained from: Patient Chief Complaint Right LE Electronic Signature(s) Signed: 07/23/2020 9:25:02 AM By: Lenda Kelp PA-C Entered By: Lenda Kelp on 07/23/2020 09:25:01 Jeff Rios ([id number]) -------------------------------------------------------------------------------- HPI Details Patient Name: Jeff Rios Date of Service: 07/23/2020 9:15 AM Medical Record Number: [id number] Patient Account Number: [id number] Date of Birth/Sex: [birth year] (55 y.o. M) Treating RN: Rogers Blocker Primary Care Provider: PATIENT, NO Other Clinician: Lolita Cram Referring Provider: Cam Hai Treating Provider/Extender: Rowan Blase in Treatment: 6 History of Present Illness HPI Description: 06/10/2020 upon evaluation today patient presents for initial evaluation here in our clinic concerning issue with a wound which began on 29 December. Subsequently the patient tells me that this is a work comp related injury that he was working at [employer] when one of the bundles of cardboard boxes that they compile unfortunately fell off of a what sounds to be dolly struck him in the back of the leg. He initially had a small laceration. Unfortunately this developed into a much bigger issue as noted today where there is necrotic tissue in the base of the wound as well. There is a lot of swelling around the edges of the wound as well. It obviously seems like this turned into a hematoma which is not farfetched considering  the fact that the patient is on long-term anticoagulant therapy secondary to his cardiac history. He does have a history of myocardial infarction even at such a young age. He is on Eliquis I do believe at this point. Subsequently the patient states that he does have some discomfort here is not excruciating but nonetheless it is problematic for him. 06/17/2020 upon evaluation today patient appears to be doing better in regard to his wound on the posterior aspect of his right leg. He has been tolerating the dressing changes without complication even in 1 week's time I do see a significant improvement here which is great news. There does not appear to be any evidence of active infection at this point which is excellent. Overall I am extremely pleased with how things seem to be progressing. 06/25/2020 upon evaluation today patient appears to be doing well with regard to his wound. This is measuring smaller and looks to be doing much better I do think the [facility] is doing a good job. 07/02/2020 upon evaluation today patient appears to be making excellent progress at this point. Fortunately there is no sign of active infection which is great news and in general I am extremely pleased with where things stand today. No fevers, chills, nausea, vomiting, or diarrhea. 07/09/2020 upon evaluation today patient appears to be doing well with regard to his leg ulcer. This is measuring much better and overall seems to be doing excellent. There is no evidence of active infection at this time. No fevers, chills, nausea, vomiting, or diarrhea. 07/16/2020 upon evaluation today patient appears to be doing well today in regard to his wound. He has been tolerating the dressing changes without complication. Fortunately there is no signs of active infection at this time. No fevers, chills, nausea, vomiting, or diarrhea. 07/23/2020 upon evaluation today patient appears to be doing excellent in regard  to his wound. This is not  quite completely healed but does seem to be doing much better than last week. It significantly smaller and very close to complete resolution. Fortunately there does not appear to be any evidence of infection right now which is great news. He is seen with his case manager in the office today as well. Electronic Signature(s) Signed: 07/23/2020 6:59:56 PM By: Lenda Kelp PA-C Entered By: Lenda Kelp on 07/23/2020 18:59:56 Jeff Rios ([id number]) -------------------------------------------------------------------------------- Physical Exam Details Patient Name: Jeff Rios Date of Service: 07/23/2020 9:15 AM Medical Record Number: [id number] Patient Account Number: [id number] Date of Birth/Sex: 11-03-1965 (55 y.o. M) Treating RN: Rogers Blocker Primary Care Provider: PATIENT, NO Other Clinician: Lolita Cram Referring Provider: Cam Hai Treating Provider/Extender: Rowan Blase in Treatment: 6 Constitutional Well-nourished and well-hydrated in no acute distress. Respiratory normal breathing without difficulty. Psychiatric this patient is able to make decisions and demonstrates good insight into disease process. Alert and Oriented x 3. pleasant and cooperative. Notes Patient's wound bed showed signs of good granulation epithelization at this point. There does not appear to be any evidence of infection which is great news and overall very pleased with where things stand. No fevers, chills, nausea, vomiting, or diarrhea. Electronic Signature(s) Signed: 07/23/2020 7:00:14 PM By: Lenda Kelp PA-C Entered By: Lenda Kelp on 07/23/2020 19:00:14 Jeff Rios ([id number]) -------------------------------------------------------------------------------- Physician Orders Details Patient Name: Jeff Rios Date of Service: 07/23/2020 9:15 AM Medical Record Number: [id number] Patient Account Number: [id number] Date of Birth/Sex: Dec 06, 1965 (55 y.o.  M) Treating RN: Rogers Blocker Primary Care Provider: PATIENT, NO Other Clinician: Lolita Cram Referring Provider: Cam Hai Treating Provider/Extender: Rowan Blase in Treatment: 6 Verbal / Phone Orders: No Diagnosis Coding ICD-10 Coding Code Description S81.801A Unspecified open wound, right lower leg, initial encounter L97.812 Non-pressure chronic ulcer of other part of right lower leg with fat layer exposed Z79.01 Long term (current) use of anticoagulants I25.10 Atherosclerotic heart disease of native coronary artery without angina pectoris Follow-up Appointments o Return Appointment in 1 week. Bathing/ Shower/ Hygiene o May shower with wound dressing protected with water repellent cover or cast protector. Edema Control - Lymphedema / Segmental Compressive Device / Other Right Lower Extremity o Optional: One layer of unna paste to top of compression wrap (to act as an anchor). o 3 Layer Compression System for Lymphedema. o Elevate, Exercise Daily and Avoid Standing for Long Periods of Time. o Elevate legs to the level of the heart and pump ankles as often as possible o Elevate leg(s) parallel to the floor when sitting. Wound Treatment Wound #1 - Lower Leg Wound Laterality: Right, Posterior Cleanser: Normal Saline 1 x Per Week/30 Days Discharge Instructions: Wash your hands with soap and water. Remove old dressing, discard into plastic bag and place into trash. Cleanse the wound with Normal Saline prior to applying a clean dressing using gauze sponges, not tissues or cotton balls. Do not scrub or use excessive force. Pat dry using gauze sponges, not tissue or cotton balls. Peri-Wound Care: Moisturizing Lotion 1 x Per Week/30 Days Discharge Instructions: Suggestions: Theraderm, Eucerin, Cetaphil, or patient preference. Primary Dressing: Curad Oil Emulsion Dressing 3x3 (in/in) 1 x Per Week/30 Days Secondary Dressing: ABD Pad 5x9 (in/in) 1 x Per Week/30  Days Discharge Instructions: Cover with ABD pad Compression Wrap: Profore Lite LF 3 Multilayer Compression Bandaging System 1 x Per Week/30 Days Discharge Instructions: Apply 3 multi-layer wrap as prescribed. Electronic Signature(s) Signed: 07/23/2020 5:09:07 PM By: Phillis Haggis,  Dondra Prader RN Signed: 07/23/2020 7:06:18 PM By: Lenda Kelp PA-C Entered By: Phillis Haggis, Dondra Prader on 07/23/2020 09:39:52 Jeff Rios ([id number]) -------------------------------------------------------------------------------- Problem List Details Patient Name: Jeff Rios Date of Service: 07/23/2020 9:15 AM Medical Record Number: [id number] Patient Account Number: [id number] Date of Birth/Sex: 10-10-1965 (54 y.o. M) Treating RN: Rogers Blocker Primary Care Provider: PATIENT, NO Other Clinician: Lolita Cram Referring Provider: Cam Hai Treating Provider/Extender: Rowan Blase in Treatment: 6 Active Problems ICD-10 Encounter Code Description Active Date MDM Diagnosis S81.801A Unspecified open wound, right lower leg, initial encounter 06/10/2020 No Yes L97.812 Non-pressure chronic ulcer of other part of right lower leg with fat layer 06/10/2020 No Yes exposed Z79.01 Long term (current) use of anticoagulants 06/10/2020 No Yes I25.10 Atherosclerotic heart disease of native coronary artery without angina 06/10/2020 No Yes pectoris Inactive Problems Resolved Problems Electronic Signature(s) Signed: 07/23/2020 9:24:54 AM By: Lenda Kelp PA-C Entered By: Lenda Kelp on 07/23/2020 09:24:54 Jeff Rios ([id number]) -------------------------------------------------------------------------------- Progress Note Details Patient Name: Jeff Rios Date of Service: 07/23/2020 9:15 AM Medical Record Number: [id number] Patient Account Number: [id number] Date of Birth/Sex: 1965/10/11 (54 y.o. M) Treating RN: Rogers Blocker Primary Care Provider: PATIENT, NO Other Clinician:  Lolita Cram Referring Provider: Cam Hai Treating Provider/Extender: Rowan Blase in Treatment: 6 Subjective Chief Complaint Information obtained from Patient Right LE History of Present Illness (HPI) 06/10/2020 upon evaluation today patient presents for initial evaluation here in our clinic concerning issue with a wound which began on 29 December. Subsequently the patient tells me that this is a work comp related injury that he was working at [employer] when one of the bundles of cardboard boxes that they compile unfortunately fell off of a what sounds to be dolly struck him in the back of the leg. He initially had a small laceration. Unfortunately this developed into a much bigger issue as noted today where there is necrotic tissue in the base of the wound as well. There is a lot of swelling around the edges of the wound as well. It obviously seems like this turned into a hematoma which is not farfetched considering the fact that the patient is on long-term anticoagulant therapy secondary to his cardiac history. He does have a history of myocardial infarction even at such a young age. He is on Eliquis I do believe at this point. Subsequently the patient states that he does have some discomfort here is not excruciating but nonetheless it is problematic for him. 06/17/2020 upon evaluation today patient appears to be doing better in regard to his wound on the posterior aspect of his right leg. He has been tolerating the dressing changes without complication even in 1 week's time I do see a significant improvement here which is great news. There does not appear to be any evidence of active infection at this point which is excellent. Overall I am extremely pleased with how things seem to be progressing. 06/25/2020 upon evaluation today patient appears to be doing well with regard to his wound. This is measuring smaller and looks to be doing much better I do think the [facility] is  doing a good job. 07/02/2020 upon evaluation today patient appears to be making excellent progress at this point. Fortunately there is no sign of active infection which is great news and in general I am extremely pleased with where things stand today. No fevers, chills, nausea, vomiting, or diarrhea. 07/09/2020 upon evaluation today patient appears to be doing well with regard to his leg  ulcer. This is measuring much better and overall seems to be doing excellent. There is no evidence of active infection at this time. No fevers, chills, nausea, vomiting, or diarrhea. 07/16/2020 upon evaluation today patient appears to be doing well today in regard to his wound. He has been tolerating the dressing changes without complication. Fortunately there is no signs of active infection at this time. No fevers, chills, nausea, vomiting, or diarrhea. 07/23/2020 upon evaluation today patient appears to be doing excellent in regard to his wound. This is not quite completely healed but does seem to be doing much better than last week. It significantly smaller and very close to complete resolution. Fortunately there does not appear to be any evidence of infection right now which is great news. He is seen with his case manager in the office today as well. Objective Constitutional Well-nourished and well-hydrated in no acute distress. Vitals Time Taken: 8:13 AM, Height: 63 in, Weight: 182 lbs, BMI: 32.2, Temperature: 98.0 F, Pulse: 76 bpm, Respiratory Rate: 18 breaths/min, Blood Pressure: 129/77 mmHg. Respiratory normal breathing without difficulty. Psychiatric this patient is able to make decisions and demonstrates good insight into disease process. Alert and Oriented x 3. pleasant and cooperative. General Notes: Patient's wound bed showed signs of good granulation epithelization at this point. There does not appear to be any evidence of infection which is great news and overall very pleased with where things stand.  No fevers, chills, nausea, vomiting, or diarrhea. Integumentary (Hair, Skin) Funches, Marwan ([id number]) Wound #1 status is Open. Original cause of wound was Trauma. The date acquired was: 05/01/2020. The wound has been in treatment 6 weeks. The wound is located on the Right,Posterior Lower Leg. The wound measures 1.2cm length x 0.1cm width x 0.1cm depth; 0.094cm^2 area and 0.009cm^3 volume. There is Fat Layer (Subcutaneous Tissue) exposed. There is no tunneling or undermining noted. There is a none present amount of drainage noted. The wound margin is flat and intact. There is large (67-100%) red, pink granulation within the wound bed. There is no necrotic tissue within the wound bed. Assessment Active Problems ICD-10 Unspecified open wound, right lower leg, initial encounter Non-pressure chronic ulcer of other part of right lower leg with fat layer exposed Long term (current) use of anticoagulants Atherosclerotic heart disease of native coronary artery without angina pectoris Procedures Wound #1 Pre-procedure diagnosis of Wound #1 is a Trauma, Other located on the Right,Posterior Lower Leg . There was a Three Layer Compression Therapy Procedure with a pre-treatment ABI of 1.2 by Rogers Blocker, RN. Post procedure Diagnosis Wound #1: Same as Pre-Procedure Plan Follow-up Appointments: Return Appointment in 1 week. Bathing/ Shower/ Hygiene: May shower with wound dressing protected with water repellent cover or cast protector. Edema Control - Lymphedema / Segmental Compressive Device / Other: Optional: One layer of unna paste to top of compression wrap (to act as an anchor). 3 Layer Compression System for Lymphedema. Elevate, Exercise Daily and Avoid Standing for Long Periods of Time. Elevate legs to the level of the heart and pump ankles as often as possible Elevate leg(s) parallel to the floor when sitting. WOUND #1: - Lower Leg Wound Laterality: Right, Posterior Cleanser: Normal  Saline 1 x Per Week/30 Days Discharge Instructions: Wash your hands with soap and water. Remove old dressing, discard into plastic bag and place into trash. Cleanse the wound with Normal Saline prior to applying a clean dressing using gauze sponges, not tissues or cotton balls. Do not scrub or use excessive force. Dennie Bible  dry using gauze sponges, not tissue or cotton balls. Peri-Wound Care: Moisturizing Lotion 1 x Per Week/30 Days Discharge Instructions: Suggestions: Theraderm, Eucerin, Cetaphil, or patient preference. Primary Dressing: Curad Oil Emulsion Dressing 3x3 (in/in) 1 x Per Week/30 Days Secondary Dressing: ABD Pad 5x9 (in/in) 1 x Per Week/30 Days Discharge Instructions: Cover with ABD pad Compression Wrap: Profore Lite LF 3 Multilayer Compression Bandaging System 1 x Per Week/30 Days Discharge Instructions: Apply 3 multi-layer wrap as prescribed. 1. I am going to suggest currently that we go ahead and initiate a continuation of 3 layer compression wrap which I feel like is doing well. Also can I suggest that we continue with the oil emulsion dressing which I think will keep things moist enough to hopefully allow this to heal without sticking to the area. 3. I am also can recommend the patient continue to elevate his legs much as possible his work restrictions remain the same per his work note provided today. We will see patient back for reevaluation in 1 week here in the clinic. If anything worsens or changes patient will contact our office for additional recommendations. Jeff Rios, Jeff Rios ([id number]) Electronic Signature(s) Signed: 07/23/2020 7:00:49 PM By: Lenda KelpStone III, Oriyah Lamphear PA-C Entered By: Lenda KelpStone III, Lyah Millirons on 07/23/2020 19:00:48 Jeff Rios, Jeff Rios ([id number]) -------------------------------------------------------------------------------- SuperBill Details Patient Name: Jeff Rios, Jeff Rios Date of Service: 07/23/2020 Medical Record Number: [id number] Patient Account Number:  [id number] Date of Birth/Sex: Apr 12, 1966 (55 y.o. M) Treating RN: Rogers BlockerSanchez, Kenia Primary Care Provider: PATIENT, NO Other Clinician: Lolita CramBurnette, Kyara Referring Provider: Cam Haiarr, Jacob Treating Provider/Extender: Rowan BlaseStone, Kayshaun Polanco Weeks in Treatment: 6 Diagnosis Coding ICD-10 Codes Code Description [phone] Unspecified open wound, right lower leg, initial encounter L97.812 Non-pressure chronic ulcer of other part of right lower leg with fat layer exposed Z79.01 Long term (current) use of anticoagulants I25.10 Atherosclerotic heart disease of native coronary artery without angina pectoris Facility Procedures CPT4 Code: [id number] Description: (Facility Use Only) [id number] - APPLY MULTLAY COMPRS LWR RT LEG Modifier: Quantity: 1 Physician Procedures CPT4 Code: [id number] Description: 99213 - WC PHYS LEVEL 3 - EST PT Modifier: Quantity: 1 CPT4 Code: Description: ICD-10 Diagnosis Description S81.801A Unspecified open wound, right lower leg, initial encounter L97.812 Non-pressure chronic ulcer of other part of right lower leg with fat la Z79.01 Long term (current) use of anticoagulants I25.10  Atherosclerotic heart disease of native coronary artery without angina Modifier: yer exposed pectoris Quantity: Electronic Signature(s) Signed: 07/23/2020 7:01:04 PM By: Lenda KelpStone III, Avin Gibbons PA-C Previous Signature: 07/23/2020 5:09:07 PM Version By: Phillis HaggisSanchez Pereyda, Dondra PraderKenia RN Entered By: Lenda KelpStone III, Verl Kitson on 07/23/2020 19:01:04

## 2020-07-24 ENCOUNTER — Telehealth: Payer: Self-pay | Admitting: *Deleted

## 2020-07-24 NOTE — Telephone Encounter (Signed)
Called and spoke to pt regarding appointment scheduled this Friday 3/25.  Wanted to confirm that pt has transportation set up and no issues getting here for this appt.  Pt verbalized that he does have transportation and will be here 3/25 at 8:00 AM for appt.  Pt aware we will discuss cath procedure instructions and obtain lab work needed and Covid test this day as well while he is in office.  Pt has no further questions at this time.

## 2020-07-26 ENCOUNTER — Other Ambulatory Visit: Payer: Self-pay

## 2020-07-26 ENCOUNTER — Other Ambulatory Visit
Admission: RE | Admit: 2020-07-26 | Discharge: 2020-07-26 | Disposition: A | Discharge status: Home / Self Care | Payer: Medicare Other | Source: Ambulatory Visit | Attending: Cardiovascular Disease | Admitting: Cardiovascular Disease

## 2020-07-26 ENCOUNTER — Other Ambulatory Visit (INDEPENDENT_AMBULATORY_CARE_PROVIDER_SITE_OTHER): Payer: Medicare Other

## 2020-07-26 ENCOUNTER — Ambulatory Visit: Payer: Medicare Other | Admitting: Physician Assistant

## 2020-07-26 ENCOUNTER — Other Ambulatory Visit (INDEPENDENT_AMBULATORY_CARE_PROVIDER_SITE_OTHER): Payer: Medicare Other | Admitting: Family

## 2020-07-26 DIAGNOSIS — R079 Chest pain, unspecified: Secondary | ICD-10-CM

## 2020-07-26 DIAGNOSIS — R0600 Dyspnea, unspecified: Secondary | ICD-10-CM

## 2020-07-26 DIAGNOSIS — R0609 Other forms of dyspnea: Secondary | ICD-10-CM

## 2020-07-26 DIAGNOSIS — I25118 Atherosclerotic heart disease of native coronary artery with other forms of angina pectoris: Secondary | ICD-10-CM

## 2020-07-26 DIAGNOSIS — Z20822 Contact with and (suspected) exposure to covid-19: Secondary | ICD-10-CM | POA: Diagnosis not present

## 2020-07-26 DIAGNOSIS — Z01812 Encounter for preprocedural laboratory examination: Secondary | ICD-10-CM | POA: Insufficient documentation

## 2020-07-26 DIAGNOSIS — R072 Precordial pain: Secondary | ICD-10-CM

## 2020-07-26 DIAGNOSIS — Z79899 Other long term (current) drug therapy: Secondary | ICD-10-CM

## 2020-07-26 MED ORDER — ATORVASTATIN CALCIUM 80 MG PO TABS: 80.0000 mg | ORAL_TABLET | Freq: Every day | ORAL | 11 refills | Status: DC

## 2020-07-26 MED ORDER — LISINOPRIL 20 MG PO TABS: 20.0000 mg | ORAL_TABLET | Freq: Every day | ORAL | 11 refills | Status: DC

## 2020-07-26 MED ORDER — AMLODIPINE BESYLATE 10 MG PO TABS: 10.0000 mg | ORAL_TABLET | Freq: Every day | ORAL | 11 refills | Status: DC

## 2020-07-26 MED ORDER — CLOPIDOGREL BISULFATE 75 MG PO TABS
75.0000 mg | ORAL_TABLET | Freq: Every day | ORAL | 11 refills | Status: DC
Start: 2020-07-26 — End: 2021-08-25

## 2020-07-26 MED ORDER — CARVEDILOL 3.125 MG PO TABS: 3.1250 mg | ORAL_TABLET | Freq: Two times a day (BID) | ORAL | 11 refills | Status: DC

## 2020-07-26 NOTE — Telephone Encounter (Signed)
See nurse visit note from today 07/26/20. Pt seen and cardiac cath instructions reviewed. Labs and Covid test completed for pre procedure. At visit, pt asked that we change all cardiac prescriptions over to [facility] as they deliver, and he is unable to pick up meds at [name] pharmacy d/t transportation issues. Rx's changed over to [facility].

## 2020-07-26 NOTE — Progress Notes (Unsigned)
Await results of CBC, BMP performed today. Agree with plan of care as detailed by Lanny Hurst, RN.   Alver Sorrow, NP

## 2020-07-26 NOTE — Addendum Note (Signed)
Addended by: Lanny Hurst on: 07/26/2020 03:54 PM   Modules accepted: Orders

## 2020-07-26 NOTE — Patient Instructions (Signed)
You are scheduled for a Cardiac Catheterization on Monday, March 28 with Dr. Lorine Bears.  1. Please arrive at the [facility] at 8:30 AM. This time is one hour prior to your procedure time. The address is [address] [place], [place] [address]. Free valet parking service is available.   Special note: Every effort is made to have your procedure done on time. Please understand that emergencies sometimes delay scheduled procedures.  2. Diet: Do not eat solid foods after midnight.  The patient may have clear liquids until 5am upon the day of the procedure.  3. Labs: You will have lab work completed today.   4. Medication instructions in preparation for your procedure:   Contrast Allergy: No  Do Not take Torsemide the morning of your test.  On the morning of your procedure, take your Aspirin and Plavix/Clopidogrel and any morning medicines NOT listed above.  You may use sips of water.  5. Plan for one night stay--bring personal belongings. 6. Bring a current list of your medications and current insurance cards. 7. You MUST have a responsible person to drive you home. 8. Someone MUST be with you the first 24 hours after you arrive home or your discharge will be delayed. 9. Please wear clothes that are easy to get on and off and wear slip-on shoes.  Thank you for allowing [place] to care for you!   -- [facility]  COVID PRE- TEST: You will need a COVID TEST prior to the procedure:   LOCATION: Pre-Admit testing office, [address] in the [facility]                                   located on the [facility] campus                                   at [address], [place], [place] [address]   DATE/TIME: Today 07/26/20 (anytime between 8 am and 2 pm)

## 2020-07-26 NOTE — Progress Notes (Unsigned)
Pt here today for nurse visit to review cardiac cath instructions, obtain lab work needed pre-procedure and to have Covid test. Nurse discussed in detail cath instructions, pt verbalized understanding with no questions at this time.  Bmet and CBC obtained in clinic. Pt does have EKG within 30 days. Pt was escorted to Covid testing suite after visit.  Transportation has been arranged for pick up at pt's residence for cath procedure on Monday 3/28. Pt verbalized understanding regarding transportation to call him to confirm pick up time and address. Pt has follow up scheduled 08/06/20 for follow up cath. Confirmed transportation with [facility] transportation to pick him up from home and will have him at the [name] for check in at 8:30 AM.

## 2020-07-27 LAB — CBC
Hematocrit: 40.8 % (ref 37.5–51.0)
Hemoglobin: 14.2 g/dL (ref 13.0–17.7)
MCH: 28.7 pg (ref 26.6–33.0)
MCHC: 34.8 g/dL (ref 31.5–35.7)
MCV: 82 fL (ref 79–97)
Platelets: 264 10*3/uL (ref 150–450)
RBC: 4.95 x10E6/uL (ref 4.14–5.80)
RDW: 14.8 % (ref 11.6–15.4)
WBC: 4.9 10*3/uL (ref 3.4–10.8)

## 2020-07-27 LAB — BASIC METABOLIC PANEL
BUN/Creatinine Ratio: 15 (ref 9–20)
BUN: 13 mg/dL (ref 6–24)
CO2: 19 mmol/L — ABNORMAL LOW (ref 20–29)
Calcium: 8.8 mg/dL (ref 8.7–10.2)
Chloride: 107 mmol/L — ABNORMAL HIGH (ref 96–106)
Creatinine, Ser: 0.88 mg/dL (ref 0.76–1.27)
Glucose: 89 mg/dL (ref 65–99)
Potassium: 4 mmol/L (ref 3.5–5.2)
Sodium: 142 mmol/L (ref 134–144)
eGFR: 103 mL/min/{1.73_m2} (ref >59)

## 2020-07-27 LAB — SARS CORONAVIRUS 2 (TAT 6-24 HRS): SARS Coronavirus 2: NEGATIVE

## 2020-07-29 ENCOUNTER — Other Ambulatory Visit: Payer: Self-pay

## 2020-07-29 ENCOUNTER — Encounter
Admission: RE | Disposition: A | Discharge status: Home / Self Care | Payer: Self-pay | Source: Home / Self Care | Attending: Cardiovascular Disease

## 2020-07-29 ENCOUNTER — Ambulatory Visit
Admission: RE | Admit: 2020-07-29 | Discharge: 2020-07-29 | Disposition: A | Discharge status: Home / Self Care | Payer: Medicare Other | Attending: Cardiovascular Disease | Admitting: Cardiovascular Disease

## 2020-07-29 ENCOUNTER — Telehealth: Payer: Self-pay

## 2020-07-29 DIAGNOSIS — R0609 Other forms of dyspnea: Secondary | ICD-10-CM | POA: Diagnosis not present

## 2020-07-29 DIAGNOSIS — Z7902 Long term (current) use of antithrombotics/antiplatelets: Secondary | ICD-10-CM | POA: Insufficient documentation

## 2020-07-29 DIAGNOSIS — Z79899 Other long term (current) drug therapy: Secondary | ICD-10-CM | POA: Insufficient documentation

## 2020-07-29 DIAGNOSIS — Z888 Allergy status to other drugs, medicaments and biological substances status: Secondary | ICD-10-CM | POA: Insufficient documentation

## 2020-07-29 DIAGNOSIS — E785 Hyperlipidemia, unspecified: Secondary | ICD-10-CM | POA: Insufficient documentation

## 2020-07-29 DIAGNOSIS — R079 Chest pain, unspecified: Secondary | ICD-10-CM

## 2020-07-29 DIAGNOSIS — Z87891 Personal history of nicotine dependence: Secondary | ICD-10-CM | POA: Diagnosis not present

## 2020-07-29 DIAGNOSIS — I10 Essential (primary) hypertension: Secondary | ICD-10-CM | POA: Diagnosis not present

## 2020-07-29 DIAGNOSIS — I2510 Atherosclerotic heart disease of native coronary artery without angina pectoris: Secondary | ICD-10-CM

## 2020-07-29 DIAGNOSIS — Z791 Long term (current) use of non-steroidal anti-inflammatories (NSAID): Secondary | ICD-10-CM | POA: Diagnosis not present

## 2020-07-29 DIAGNOSIS — R0789 Other chest pain: Secondary | ICD-10-CM | POA: Insufficient documentation

## 2020-07-29 DIAGNOSIS — Z7982 Long term (current) use of aspirin: Secondary | ICD-10-CM | POA: Diagnosis not present

## 2020-07-29 DIAGNOSIS — Z955 Presence of coronary angioplasty implant and graft: Secondary | ICD-10-CM | POA: Insufficient documentation

## 2020-07-29 DIAGNOSIS — R0600 Dyspnea, unspecified: Secondary | ICD-10-CM

## 2020-07-29 HISTORY — PX: LEFT HEART CATH AND CORONARY ANGIOGRAPHY: CATH118249

## 2020-07-29 SURGERY — LEFT HEART CATH AND CORONARY ANGIOGRAPHY
Anesthesia: Moderate Sedation

## 2020-07-29 MED ORDER — VERAPAMIL HCL 2.5 MG/ML IV SOLN
INTRAVENOUS | Status: AC
  Filled 2020-07-29: qty 2

## 2020-07-29 MED ORDER — HEPARIN SODIUM (PORCINE) 1000 UNIT/ML IJ SOLN
INTRAMUSCULAR | Status: DC | PRN
  Administered 2020-07-29: 4500 [IU] via INTRAVENOUS

## 2020-07-29 MED ORDER — CLOPIDOGREL BISULFATE 75 MG PO TABS
ORAL_TABLET | ORAL | Status: AC
  Filled 2020-07-29: qty 1

## 2020-07-29 MED ORDER — SODIUM CHLORIDE 0.9 % WEIGHT BASED INFUSION: 1.0000 mL/kg/h | INTRAVENOUS | Status: DC

## 2020-07-29 MED ORDER — HEPARIN (PORCINE) IN NACL 1000-0.9 UT/500ML-% IV SOLN
INTRAVENOUS | Status: DC | PRN
  Administered 2020-07-29 (×2): 500 mL

## 2020-07-29 MED ORDER — LIDOCAINE HCL (PF) 1 % IJ SOLN
INTRAMUSCULAR | Status: DC | PRN
  Administered 2020-07-29: 2 mL

## 2020-07-29 MED ORDER — FENTANYL CITRATE (PF) 100 MCG/2ML IJ SOLN
INTRAMUSCULAR | Status: AC
  Filled 2020-07-29: qty 2

## 2020-07-29 MED ORDER — IOHEXOL 300 MG/ML  SOLN
INTRAMUSCULAR | Status: DC | PRN
  Administered 2020-07-29: 28 mL

## 2020-07-29 MED ORDER — ASPIRIN 81 MG PO CHEW: 81.0000 mg | CHEWABLE_TABLET | ORAL | Status: DC

## 2020-07-29 MED ORDER — ASPIRIN 81 MG PO CHEW
CHEWABLE_TABLET | ORAL | Status: AC
  Filled 2020-07-29: qty 1

## 2020-07-29 MED ORDER — HEPARIN SODIUM (PORCINE) 1000 UNIT/ML IJ SOLN
INTRAMUSCULAR | Status: AC
  Filled 2020-07-29: qty 1

## 2020-07-29 MED ORDER — SODIUM CHLORIDE 0.9 % WEIGHT BASED INFUSION: 3.0000 mL/kg/h | INTRAVENOUS | Status: AC

## 2020-07-29 MED ORDER — FENTANYL CITRATE (PF) 100 MCG/2ML IJ SOLN
INTRAMUSCULAR | Status: DC | PRN
  Administered 2020-07-29: 25 ug via INTRAVENOUS

## 2020-07-29 MED ORDER — CLOPIDOGREL BISULFATE 75 MG PO TABS: 75.0000 mg | ORAL_TABLET | Freq: Once | ORAL | Status: DC

## 2020-07-29 MED ORDER — VERAPAMIL HCL 2.5 MG/ML IV SOLN
INTRAVENOUS | Status: DC | PRN
  Administered 2020-07-29: 2.5 mg via INTRA_ARTERIAL

## 2020-07-29 MED ORDER — HEPARIN (PORCINE) IN NACL 1000-0.9 UT/500ML-% IV SOLN
INTRAVENOUS | Status: AC
  Filled 2020-07-29: qty 1000

## 2020-07-29 MED ORDER — MIDAZOLAM HCL 2 MG/2ML IJ SOLN
INTRAMUSCULAR | Status: AC
  Filled 2020-07-29: qty 2

## 2020-07-29 MED ORDER — SODIUM CHLORIDE 0.9% FLUSH: 3.0000 mL | INTRAVENOUS | Status: DC | PRN

## 2020-07-29 MED ORDER — SODIUM CHLORIDE 0.9 % IV SOLN: 250.0000 mL | INTRAVENOUS | Status: DC | PRN

## 2020-07-29 MED ORDER — MIDAZOLAM HCL 2 MG/2ML IJ SOLN
INTRAMUSCULAR | Status: DC | PRN
  Administered 2020-07-29: 1 mg via INTRAVENOUS

## 2020-07-29 SURGICAL SUPPLY — 11 items
CATH INFINITI 5FR JK (CATHETERS) ×2 IMPLANT
DEVICE RAD TR BAND REGULAR (VASCULAR PRODUCTS) ×2 IMPLANT
DRAPE BRACHIAL (DRAPES) ×2 IMPLANT
GLIDESHEATH SLEND SS 6F .021 (SHEATH) ×2 IMPLANT
GUIDEWIRE INQWIRE 1.5J.035X260 (WIRE) ×1 IMPLANT
INQWIRE 1.5J .035X260CM (WIRE) ×2
PACK CARDIAC CATH (CUSTOM PROCEDURE TRAY) ×2 IMPLANT
PROTECTION STATION PRESSURIZED (MISCELLANEOUS) ×2
SET ATX SIMPLICITY (MISCELLANEOUS) ×2 IMPLANT
STATION PROTECTION PRESSURIZED (MISCELLANEOUS) ×1 IMPLANT
WIRE NITINOL .018 (WIRE) IMPLANT

## 2020-07-29 NOTE — Discharge Instructions (Signed)
Radial Site Care  This sheet gives you information about how to care for yourself after your procedure. Your health care provider may also give you more specific instructions. If you have problems or questions, contact your health care provider. What can I expect after the procedure? After the procedure, it is common to have:  Bruising and tenderness at the catheter insertion area. Follow these instructions at home: Medicines  Take over-the-counter and prescription medicines only as told by your health care provider. Insertion site care  Follow instructions from your health care provider about how to take care of your insertion site. Make sure you: ? Wash your hands with soap and water before you change your bandage (dressing). If soap and water are not available, use hand sanitizer. ? Change your dressing as told by your health care provider. ? Leave stitches (sutures), skin glue, or adhesive strips in place. These skin closures may need to stay in place for 2 weeks or longer. If adhesive strip edges start to loosen and curl up, you may trim the loose edges. Do not remove adhesive strips completely unless your health care provider tells you to do that.  Check your insertion site every day for signs of infection. Check for: ? Redness, swelling, or pain. ? Fluid or blood. ? Pus or a bad smell. ? Warmth.  Do not take baths, swim, or use a hot tub until your health care provider approves.  You may shower 24-48 hours after the procedure, or as directed by your health care provider. ? Remove the dressing and gently wash the site with plain soap and water. ? Pat the area dry with a clean towel. ? Do not rub the site. That could cause bleeding.  Do not apply powder or lotion to the site. Activity  For 24 hours after the procedure, or as directed by your health care provider: ? Do not flex or bend the affected arm. ? Do not push or pull heavy objects with the affected arm. ? Do not drive  yourself home from the hospital or clinic. You may drive 24 hours after the procedure unless your health care provider tells you not to. ? Do not operate machinery or power tools.  Do not lift anything that is heavier than 10 lb (4.5 kg), or the limit that you are told, until your health care provider says that it is safe.  Ask your health care provider when it is okay to: ? Return to work or school. ? Resume usual physical activities or sports. ? Resume sexual activity.   General instructions  If the catheter site starts to bleed, raise your arm and put firm pressure on the site. If the bleeding does not stop, get help right away. This is a medical emergency.  If you went home on the same day as your procedure, a responsible adult should be with you for the first 24 hours after you arrive home.  Keep all follow-up visits as told by your health care provider. This is important. Contact a health care provider if:  You have a fever.  You have redness, swelling, or yellow drainage around your insertion site. Get help right away if:  You have unusual pain at the radial site.  The catheter insertion area swells very fast.  The insertion area is bleeding, and the bleeding does not stop when you hold steady pressure on the area.  Your arm or hand becomes pale, cool, tingly, or numb. These symptoms may represent a serious   problem that is an emergency. Do not wait to see if the symptoms will go away. Get medical help right away. Call your local emergency services (911 in the [place] Do not drive yourself to the hospital. Summary  After the procedure, it is common to have bruising and tenderness at the site.  Follow instructions from your health care provider about how to take care of your radial site wound. Check the wound every day for signs of infection.  Do not lift anything that is heavier than 10 lb (4.5 kg), or the limit that you are told, until your health care provider says that it  is safe. This information is not intended to replace advice given to you by your health care provider. Make sure you discuss any questions you have with your health care provider. Document Revised: 05/26/2017 Document Reviewed: 05/26/2017 Elsevier Patient Education  2021 [employer]  

## 2020-07-29 NOTE — Interval H&P Note (Signed)
Cath Lab Visit (complete for each Cath Lab visit)  Clinical Evaluation Leading to the Procedure:   ACS: No.  Non-ACS:    Anginal Classification: CCS III  Anti-ischemic medical therapy: Maximal Therapy (2 or more classes of medications)  Non-Invasive Test Results: Low-risk stress test findings: cardiac mortality <1%/year  Prior CABG: No previous CABG      History and Physical Interval Note:  07/29/2020 12:01 PM  Jeff Rios  has presented today for surgery, with the diagnosis of LT Heart Cath   Chest pain  Dyspnea.  The various methods of treatment have been discussed with the patient and family. After consideration of risks, benefits and other options for treatment, the patient has consented to  Procedure(s): LEFT HEART CATH AND CORONARY ANGIOGRAPHY (N/A) as a surgical intervention.  The patient's history has been reviewed, patient examined, no change in status, stable for surgery.  I have reviewed the patient's chart and labs.  Questions were answered to the patient's satisfaction.     Lorine Bears

## 2020-07-29 NOTE — Progress Notes (Signed)
[name], [name] ([id number]) Visit Report for 07/23/2020 Arrival Information Details Patient Name: Jeff Rios Date of Service: 07/23/2020 9:15 AM Medical Record Number: [id number] Patient Account Number: [id number] Date of Birth/Sex: [birth year] (55 y.o. M) Treating RN: Dolan Amen Primary Care Jaquila Santelli: PATIENT, NO Other Clinician: Jeanine Luz Referring Quatisha Zylka: Roland Rack Treating Breely Panik/Extender: Skipper Cliche in Treatment: 6 Visit Information History Since Last Visit Added or deleted any medications: No Patient Arrived: Ambulatory Had a fall or experienced change in No Arrival Time: 09:07 activities of daily living that may affect Accompanied By: self risk of falls: Transfer Assistance: None Hospitalized since last visit: No Patient Identification Verified: Yes Pain Present Now: No Secondary Verification Process Completed: Yes Patient Requires Transmission-Based No Precautions: Patient Has Alerts: Yes Patient Alerts: Patient on Blood Thinner PLAVIX Electronic Signature(s) Signed: 07/23/2020 3:37:26 PM By: Jeanine Luz Entered By: Jeanine Luz on 07/23/2020 09:09:33 Jeff Rios ([id number]) -------------------------------------------------------------------------------- Clinic Level of Care Assessment Details Patient Name: Jeff Rios Date of Service: 07/23/2020 9:15 AM Medical Record Number: [id number] Patient Account Number: [id number] Date of Birth/Sex: [birth year] (55 y.o. M) Treating RN: Dolan Amen Primary Care Oneal Schoenberger: PATIENT, NO Other Clinician: Jeanine Luz Referring Melford Tullier: Roland Rack Treating Casten Floren/Extender: Skipper Cliche in Treatment: 6 Clinic Level of Care Assessment Items TOOL 1 Quantity Score '[]'  - Use when EandM and Procedure is performed on INITIAL visit 0 ASSESSMENTS - Nursing Assessment / Reassessment '[]'  - General Physical Exam (combine w/ comprehensive assessment (listed just below) when  performed on new 0 pt. evals) '[]'  - 0 Comprehensive Assessment (HX, ROS, Risk Assessments, Wounds Hx, etc.) ASSESSMENTS - Wound and Skin Assessment / Reassessment '[]'  - Dermatologic / Skin Assessment (not related to wound area) 0 ASSESSMENTS - Ostomy and/or Continence Assessment and Care '[]'  - Incontinence Assessment and Management 0 '[]'  - 0 Ostomy Care Assessment and Management (repouching, etc.) PROCESS - Coordination of Care '[]'  - Simple Patient / Family Education for ongoing care 0 '[]'  - 0 Complex (extensive) Patient / Family Education for ongoing care '[]'  - 0 Staff obtains Programmer, systems, Records, Test Results / Process Orders '[]'  - 0 Staff telephones HHA, Nursing Homes / Clarify orders / etc '[]'  - 0 Routine Transfer to another Facility (non-emergent condition) '[]'  - 0 Routine Hospital Admission (non-emergent condition) '[]'  - 0 New Admissions / Biomedical engineer / Ordering NPWT, Apligraf, etc. '[]'  - 0 Emergency Hospital Admission (emergent condition) PROCESS - Special Needs '[]'  - Pediatric / Minor Patient Management 0 '[]'  - 0 Isolation Patient Management '[]'  - 0 Hearing / Language / Visual special needs '[]'  - 0 Assessment of Community assistance (transportation, D/C planning, etc.) '[]'  - 0 Additional assistance / Altered mentation '[]'  - 0 Support Surface(s) Assessment (bed, cushion, seat, etc.) INTERVENTIONS - Miscellaneous '[]'  - External ear exam 0 '[]'  - 0 Patient Transfer (multiple staff / Civil Service fast streamer / Similar devices) '[]'  - 0 Simple Staple / Suture removal (25 or less) '[]'  - 0 Complex Staple / Suture removal (26 or more) '[]'  - 0 Hypo/Hyperglycemic Management (do not check if billed separately) '[]'  - 0 Ankle / Brachial Index (ABI) - do not check if billed separately Has the patient been seen at the hospital within the last three years: Yes Total Score: 0 Level Of Care: ____ Jeff Rios ([id number]) Electronic Signature(s) Signed: 07/23/2020 5:09:07 PM By: Georges Mouse,  Minus Breeding RN Entered By: Georges Mouse, Minus Breeding on 07/23/2020 09:39:18 Jeff Rios ([id number]) -------------------------------------------------------------------------------- Compression Therapy Details Patient Name: Jeff Rios Date of Service: 07/23/2020 9:15 AM Medical Record  Number: [id number] Patient Account Number: [id number] Date of Birth/Sex: 12-20-1965 (54 y.o. M) Treating RN: Dolan Amen Primary Care Sondos Wolfman: PATIENT, NO Other Clinician: Jeanine Luz Referring Wilbur Oakland: Roland Rack Treating Eesha Schmaltz/Extender: Skipper Cliche in Treatment: 6 Compression Therapy Performed for Wound Assessment: Wound #1 Right,Posterior Lower Leg Performed By: Cora Daniels, RN Compression Type: Three Layer Pre Treatment ABI: 1.2 Post Procedure Diagnosis Same as Pre-procedure Electronic Signature(s) Signed: 07/23/2020 5:09:07 PM By: Georges Mouse, Minus Breeding RN Entered By: Georges Mouse, Minus Breeding on 07/23/2020 09:35:29 Jeff Rios ([id number]) -------------------------------------------------------------------------------- Encounter Discharge Information Details Patient Name: Jeff Rios Date of Service: 07/23/2020 9:15 AM Medical Record Number: [id number] Patient Account Number: [id number] Date of Birth/Sex: April 29, 1966 (55 y.o. M) Treating RN: Carlene Coria Primary Care Kaelon Weekes: PATIENT, NO Other Clinician: Jeanine Luz Referring Kimerly Rowand: Roland Rack Treating Lyon Dumont/Extender: Skipper Cliche in Treatment: 6 Encounter Discharge Information Items Discharge Condition: Stable Ambulatory Status: Ambulatory Discharge Destination: Home Transportation: Private Auto Accompanied By: self Schedule Follow-up Appointment: Yes Clinical Summary of Care: Patient Declined Electronic Signature(s) Signed: 07/29/2020 5:08:01 PM By: Carlene Coria RN Entered By: Carlene Coria on 07/23/2020 09:55:44 Jeff Rios  ([id number]) -------------------------------------------------------------------------------- Lower Extremity Assessment Details Patient Name: Jeff Rios Date of Service: 07/23/2020 9:15 AM Medical Record Number: [id number] Patient Account Number: [id number] Date of Birth/Sex: [birth year] (54 y.o. M) Treating RN: Dolan Amen Primary Care Dellie Piasecki: PATIENT, NO Other Clinician: Jeanine Luz Referring Karielle Davidow: Roland Rack Treating Nashid Pellum/Extender: Skipper Cliche in Treatment: 6 Edema Assessment Assessed: [Left: No] [Right: Yes] Edema: [Left: N] [Right: o] Calf Left: Right: Point of Measurement: 30 cm From Medial Instep 32.5 cm Ankle Left: Right: Point of Measurement: 10 cm From Medial Instep 22.4 cm Vascular Assessment Pulses: Dorsalis Pedis Palpable: [Right:Yes] Electronic Signature(s) Signed: 07/23/2020 3:37:26 PM By: Jeanine Luz Signed: 07/23/2020 5:09:07 PM By: Georges Mouse, Minus Breeding RN Entered By: Jeanine Luz on 07/23/2020 09:25:23 Jeff Rios ([id number]) -------------------------------------------------------------------------------- Multi Wound Chart Details Patient Name: Jeff Rios Date of Service: 07/23/2020 9:15 AM Medical Record Number: [id number] Patient Account Number: [id number] Date of Birth/Sex: 01/12/1966 (55 y.o. M) Treating RN: Dolan Amen Primary Care Ji Feldner: PATIENT, NO Other Clinician: Jeanine Luz Referring Akaya Proffit: Roland Rack Treating Aireana Ryland/Extender: Skipper Cliche in Treatment: 6 Vital Signs Height(in): 63 Pulse(bpm): 76 Weight(lbs): 182 Blood Pressure(mmHg): 129/77 Body Mass Index(BMI): 32 Temperature(F): 98.0 Respiratory Rate(breaths/min): 18 Photos: [N/A:N/A] Wound Location: Right, Posterior Lower Leg N/A N/A Wounding Event: Trauma N/A N/A Primary Etiology: Trauma, Other N/A N/A Comorbid History: Coronary Artery Disease, N/A N/A Hypertension, Myocardial Infarction Date Acquired:  05/01/2020 N/A N/A Weeks of Treatment: 6 N/A N/A Wound Status: Open N/A N/A Measurements L x W x D (cm) 0.2x0.2x0.1 N/A N/A Area (cm) : 0.031 N/A N/A Volume (cm) : 0.003 N/A N/A % Reduction in Area: 99.60% N/A N/A % Reduction in Volume: 99.90% N/A N/A Classification: Full Thickness Without Exposed N/A N/A Support Structures Exudate Amount: None Present N/A N/A Wound Margin: Flat and Intact N/A N/A Granulation Amount: Large (67-100%) N/A N/A Granulation Quality: Red, Pink N/A N/A Necrotic Amount: None Present (0%) N/A N/A Exposed Structures: Fat Layer (Subcutaneous Tissue): N/A N/A Yes Fascia: No Tendon: No Muscle: No Joint: No Bone: No Epithelialization: Large (67-100%) N/A N/A Treatment Notes Electronic Signature(s) Signed: 07/23/2020 5:09:07 PM By: Georges Mouse, Minus Breeding RN Entered By: Georges Mouse, Minus Breeding on 07/23/2020 09:32:02 Jeff Rios ([id number]) -------------------------------------------------------------------------------- Multi-Disciplinary Care Plan Details Patient Name: Jeff Rios Date of Service: 07/23/2020 9:15 AM Medical Record Number: [id number] Patient Account Number: [id number] Date of Birth/Sex: Oct 24, 1965 (54  y.o. M) Treating RN: Dolan Amen Primary Care Aishani Kalis: PATIENT, NO Other Clinician: Jeanine Luz Referring Nekoda Chock: Roland Rack Treating Elmina Hendel/Extender: Skipper Cliche in Treatment: 6 Active Inactive Wound/Skin Impairment Nursing Diagnoses: Knowledge deficit related to ulceration/compromised skin integrity Goals: Patient/caregiver will verbalize understanding of skin care regimen Date Initiated: 06/10/2020 Date Inactivated: 07/02/2020 Target Resolution Date: 07/08/2020 Goal Status: Met Ulcer/skin breakdown will have a volume reduction of 30% by week 4 Date Initiated: 06/10/2020 Date Inactivated: 07/16/2020 Target Resolution Date: 07/08/2020 Goal Status: Met Ulcer/skin breakdown will have a volume reduction of 50% by  week 8 Date Initiated: 06/10/2020 Target Resolution Date: 08/08/2020 Goal Status: Active Ulcer/skin breakdown will have a volume reduction of 80% by week 12 Date Initiated: 06/10/2020 Target Resolution Date: 09/07/2020 Goal Status: Active Ulcer/skin breakdown will heal within 14 weeks Date Initiated: 06/10/2020 Target Resolution Date: 10/08/2020 Goal Status: Active Interventions: Assess patient/caregiver ability to obtain necessary supplies Assess patient/caregiver ability to perform ulcer/skin care regimen upon admission and as needed Assess ulceration(s) every visit Notes: Electronic Signature(s) Signed: 07/23/2020 5:09:07 PM By: Georges Mouse, Minus Breeding RN Entered By: Georges Mouse, Minus Breeding on 07/23/2020 09:29:42 Jeff Rios ([id number]) -------------------------------------------------------------------------------- Pain Assessment Details Patient Name: Jeff Rios Date of Service: 07/23/2020 9:15 AM Medical Record Number: [id number] Patient Account Number: [id number] Date of Birth/Sex: March 17, 1966 (55 y.o. M) Treating RN: Dolan Amen Primary Care Jermiyah Ricotta: PATIENT, NO Other Clinician: Jeanine Luz Referring Rhoderick Farrel: Roland Rack Treating Pius Byrom/Extender: Skipper Cliche in Treatment: 6 Active Problems Location of Pain Severity and Description of Pain Patient Has Paino No Site Locations Pain Management and Medication Current Pain Management: Electronic Signature(s) Signed: 07/23/2020 3:37:26 PM By: Jeanine Luz Signed: 07/23/2020 5:09:07 PM By: Georges Mouse, Minus Breeding RN Entered By: Jeanine Luz on 07/23/2020 09:22:52 Jeff Rios ([id number]) -------------------------------------------------------------------------------- Patient/Caregiver Education Details Patient Name: Jeff Rios Date of Service: 07/23/2020 9:15 AM Medical Record Number: [id number] Patient Account Number: [id number] Date of Birth/Gender: 10-24-1965 (55 y.o. M) Treating RN:  Dolan Amen Primary Care Physician: PATIENT, NO Other Clinician: Jeanine Luz Referring Physician: Roland Rack Treating Physician/Extender: Skipper Cliche in Treatment: 6 Education Assessment Education Provided To: Patient Education Topics Provided Wound/Skin Impairment: Methods: Explain/Verbal Responses: State content correctly Electronic Signature(s) Signed: 07/23/2020 5:09:07 PM By: Georges Mouse, Minus Breeding RN Entered By: Georges Mouse, Minus Breeding on 07/23/2020 09:39:35 Jeff Rios ([id number]) -------------------------------------------------------------------------------- Wound Assessment Details Patient Name: Jeff Rios Date of Service: 07/23/2020 9:15 AM Medical Record Number: [id number] Patient Account Number: [id number] Date of Birth/Sex: 1965-10-05 (54 y.o. M) Treating RN: Dolan Amen Primary Care Javon Hupfer: PATIENT, NO Other Clinician: Jeanine Luz Referring Elvie Maines: Roland Rack Treating Margretta Zamorano/Extender: Skipper Cliche in Treatment: 6 Wound Status Wound Number: 1 Primary Trauma, Other Etiology: Wound Location: Right, Posterior Lower Leg Wound Status: Open Wounding Event: Trauma Comorbid Coronary Artery Disease, Hypertension, Myocardial Date Acquired: 05/01/2020 History: Infarction Weeks Of Treatment: 6 Clustered Wound: No Photos Wound Measurements Length: (cm) 1.2 Width: (cm) 0.1 Depth: (cm) 0.1 Area: (cm) 0.094 Volume: (cm) 0.009 % Reduction in Area: 98.8% % Reduction in Volume: 99.8% Epithelialization: Large (67-100%) Tunneling: No Undermining: No Wound Description Classification: Full Thickness Without Exposed Support Structures Wound Margin: Flat and Intact Exudate Amount: None Present Foul Odor After Cleansing: No Slough/Fibrino No Wound Bed Granulation Amount: Large (67-100%) Exposed Structure Granulation Quality: Red, Pink Fascia Exposed: No Necrotic Amount: None Present (0%) Fat Layer (Subcutaneous Tissue)  Exposed: Yes Tendon Exposed: No Muscle Exposed: No Joint Exposed: No Bone Exposed: No Treatment Notes Wound #1 (Lower Leg) Wound Laterality: Right, Posterior Cleanser Normal  Saline Discharge Instruction: Wash your hands with soap and water. Remove old dressing, discard into plastic bag and place into trash. Cleanse the wound with Normal Saline prior to applying a clean dressing using gauze sponges, not tissues or cotton balls. Do not scrub or use excessive force. Pat dry using gauze sponges, not tissue or cotton balls. Peri-Wound Care JIGAR, ZIELKE ([id number]) [facility] Discharge Instruction: Suggestions: Theraderm, Eucerin, Cetaphil, or patient preference. Topical Primary Dressing Curad Oil Emulsion Dressing 3x3 (in/in) Secondary Dressing ABD Pad 5x9 (in/in) Discharge Instruction: Cover with ABD pad Secured With Compression Wrap Profore Lite LF 3 Multilayer Compression Bandaging System Discharge Instruction: Apply 3 multi-layer wrap as prescribed. Compression Stockings Add-Ons Electronic Signature(s) Signed: 07/23/2020 5:09:07 PM By: Georges Mouse, Minus Breeding RN Entered By: Georges Mouse, Minus Breeding on 07/23/2020 09:34:11 Jeff Rios ([id number]) -------------------------------------------------------------------------------- Vitals Details Patient Name: Jeff Rios Date of Service: 07/23/2020 9:15 AM Medical Record Number: [id number] Patient Account Number: [id number] Date of Birth/Sex: 1965-07-18 (54 y.o. M) Treating RN: Dolan Amen Primary Care Aerabella Galasso: PATIENT, NO Other Clinician: Jeanine Luz Referring Kaho Selle: Roland Rack Treating Anahid Eskelson/Extender: Skipper Cliche in Treatment: 6 Vital Signs Time Taken: 08:13 Temperature (F): 98.0 Height (in): 63 Pulse (bpm): 76 Weight (lbs): 182 Respiratory Rate (breaths/min): 18 Body Mass Index (BMI): 32.2 Blood Pressure (mmHg): 129/77 Reference Range: 80 - 120 mg / dl Electronic  Signature(s) Signed: 07/23/2020 3:37:26 PM By: Jeanine Luz Entered By: Jeanine Luz on 07/23/2020 09:22:47

## 2020-07-29 NOTE — Telephone Encounter (Signed)
-----   Message from Alver Sorrow, NP sent at 07/27/2020  8:37 AM EDT ----- Normal kidney function and CBC with no evidence of infection nor anemia. Proceed with cath as planned

## 2020-07-29 NOTE — Telephone Encounter (Signed)
Attempted to contact the patient with lab results. Unable to lmtcb. Patients voicemail is full.

## 2020-07-30 ENCOUNTER — Encounter: Payer: Self-pay | Admitting: Cardiovascular Disease

## 2020-07-30 ENCOUNTER — Encounter: Payer: Medicare Other | Admitting: Physician Assistant

## 2020-07-30 DIAGNOSIS — S81801A Unspecified open wound, right lower leg, initial encounter: Secondary | ICD-10-CM | POA: Diagnosis not present

## 2020-07-30 NOTE — Progress Notes (Addendum)
HEATHER, STREEPER ([id number]) Visit Report for 07/30/2020 Chief Complaint Document Details Patient Name: Jeff Rios, Jeff Rios Date of Service: 07/30/2020 12:45 PM Medical Record Number: [id number] Patient Account Number: [id number] Date of Birth/Sex: [birth year] (55 y.o. M) Treating RN: Rogers Blocker Primary Care Provider: PATIENT, NO Other Clinician: Lolita Cram Referring Provider: Cam Hai Treating Provider/Extender: Rowan Blase in Treatment: 7 Information Obtained from: Patient Chief Complaint Right LE Electronic Signature(s) Signed: 07/30/2020 1:08:44 PM By: Lenda Kelp PA-C Entered By: Lenda Kelp on 07/30/2020 13:08:44 Jeff Rios ([id number]) -------------------------------------------------------------------------------- HPI Details Patient Name: Jeff Rios Date of Service: 07/30/2020 12:45 PM Medical Record Number: [id number] Patient Account Number: [id number] Date of Birth/Sex: 1965-07-02 (55 y.o. M) Treating RN: Rogers Blocker Primary Care Provider: PATIENT, NO Other Clinician: Lolita Cram Referring Provider: Cam Hai Treating Provider/Extender: Rowan Blase in Treatment: 7 History of Present Illness HPI Description: 06/10/2020 upon evaluation today patient presents for initial evaluation here in our clinic concerning issue with a wound which began on 29 December. Subsequently the patient tells me that this is a work comp related injury that he was working at [employer] when one of the bundles of cardboard boxes that they compile unfortunately fell off of a what sounds to be dolly struck him in the back of the leg. He initially had a small laceration. Unfortunately this developed into a much bigger issue as noted today where there is necrotic tissue in the base of the wound as well. There is a lot of swelling around the edges of the wound as well. It obviously seems like this turned into a hematoma which is not farfetched  considering the fact that the patient is on long-term anticoagulant therapy secondary to his cardiac history. He does have a history of myocardial infarction even at such a young age. He is on Eliquis I do believe at this point. Subsequently the patient states that he does have some discomfort here is not excruciating but nonetheless it is problematic for him. 06/17/2020 upon evaluation today patient appears to be doing better in regard to his wound on the posterior aspect of his right leg. He has been tolerating the dressing changes without complication even in 1 week's time I do see a significant improvement here which is great news. There does not appear to be any evidence of active infection at this point which is excellent. Overall I am extremely pleased with how things seem to be progressing. 06/25/2020 upon evaluation today patient appears to be doing well with regard to his wound. This is measuring smaller and looks to be doing much better I do think the [facility] is doing a good job. 07/02/2020 upon evaluation today patient appears to be making excellent progress at this point. Fortunately there is no sign of active infection which is great news and in general I am extremely pleased with where things stand today. No fevers, chills, nausea, vomiting, or diarrhea. 07/09/2020 upon evaluation today patient appears to be doing well with regard to his leg ulcer. This is measuring much better and overall seems to be doing excellent. There is no evidence of active infection at this time. No fevers, chills, nausea, vomiting, or diarrhea. 07/16/2020 upon evaluation today patient appears to be doing well today in regard to his wound. He has been tolerating the dressing changes without complication. Fortunately there is no signs of active infection at this time. No fevers, chills, nausea, vomiting, or diarrhea. 07/23/2020 upon evaluation today patient appears to be doing excellent in regard  to his wound.  This is not quite completely healed but does seem to be doing much better than last week. It significantly smaller and very close to complete resolution. Fortunately there does not appear to be any evidence of infection right now which is great news. He is seen with his case manager in the office today as well. 07/30/2020 upon evaluation today patient appears to be doing well with regard to his wound on the leg. In fact this appears to be completely healed which is excellent news. Overall I am extremely pleased with where things stand. Electronic Signature(s) Signed: 07/30/2020 1:31:24 PM By: Lenda KelpStone III, Tristyn Pharris PA-C Entered By: Lenda KelpStone III, Sanchez Hemmer on 07/30/2020 13:31:24 Jeff RicksWATLINGTON, [name] ([id number]) -------------------------------------------------------------------------------- Physical Exam Details Patient Name: Jeff RicksWATLINGTON, Andrez Date of Service: 07/30/2020 12:45 PM Medical Record Number: [id number] Patient Account Number: [id number] Date of Birth/Sex: 06-01-1965 (55 y.o. M) Treating RN: Rogers BlockerSanchez, Kenia Primary Care Provider: PATIENT, NO Other Clinician: Lolita CramBurnette, Kyara Referring Provider: Cam Haiarr, Jacob Treating Provider/Extender: Rowan BlaseStone, Khiana Camino Weeks in Treatment: 7 Constitutional Well-nourished and well-hydrated in no acute distress. Respiratory normal breathing without difficulty. Psychiatric this patient is able to make decisions and demonstrates good insight into disease process. Alert and Oriented x 3. pleasant and cooperative. Notes Patient does not appear to show any signs of active infection at this time which is great news and overall I am extremely pleased. In fact he shows complete epithelization which is great news that is good to be really no residual deficit at this point though I did explain to the patient that he is going to need to make sure to wear his compression sock for really probably the next month just to make sure nothing swells as a result of the injury that could  potentially reopen this. He voiced understanding. Electronic Signature(s) Signed: 07/30/2020 1:31:55 PM By: Lenda KelpStone III, Starnisha Batrez PA-C Entered By: Lenda KelpStone III, Arabela Basaldua on 07/30/2020 13:31:55 Jeff RicksWATLINGTON, Darryl ([id number]) -------------------------------------------------------------------------------- Physician Orders Details Patient Name: Jeff RicksWATLINGTON, Kolbee Date of Service: 07/30/2020 12:45 PM Medical Record Number: [id number] Patient Account Number: [id number] Date of Birth/Sex: 06-01-1965 (55 y.o. M) Treating RN: Rogers BlockerSanchez, Kenia Primary Care Provider: PATIENT, NO Other Clinician: Lolita CramBurnette, Kyara Referring Provider: Cam Haiarr, Jacob Treating Provider/Extender: Rowan BlaseStone, Hiliana Eilts Weeks in Treatment: 7 Verbal / Phone Orders: No Diagnosis Coding ICD-10 Coding Code Description S81.801A Unspecified open wound, right lower leg, initial encounter L97.812 Non-pressure chronic ulcer of other part of right lower leg with fat layer exposed Z79.01 Long term (current) use of anticoagulants I25.10 Atherosclerotic heart disease of native coronary artery without angina pectoris [facility]charge [facility] Center Treatment Complete - Continue wearing compression sock on right leg for a month o Moisturize legs daily after removing compression garments. Additional Orders / Instructions o May return to work at full capacity. Wound Treatment Electronic Signature(s) Signed: 07/30/2020 2:16:03 PM By: Lenda KelpStone III, Kiahna Banghart PA-C Signed: 07/30/2020 5:07:41 PM By: Phillis HaggisSanchez Pereyda, Dondra PraderKenia RN Entered By: Phillis HaggisSanchez Pereyda, Dondra PraderKenia on 07/30/2020 13:20:00 Jeff RicksWATLINGTON, Kristofor ([id number]) -------------------------------------------------------------------------------- Problem List Details Patient Name: Jeff RicksWATLINGTON, Raef Date of Service: 07/30/2020 12:45 PM Medical Record Number: [id number] Patient Account Number: [id number] Date of Birth/Sex: 06-01-1965 (55 y.o. M) Treating RN: Rogers BlockerSanchez, Kenia Primary Care  Provider: PATIENT, NO Other Clinician: Lolita CramBurnette, Kyara Referring Provider: Cam Haiarr, Jacob Treating Provider/Extender: Rowan BlaseStone, Elan Mcelvain Weeks in Treatment: 7 Active Problems ICD-10 Encounter Code Description Active Date MDM Diagnosis S81.801A Unspecified open wound, right lower leg, initial encounter 06/10/2020 No Yes L97.812 Non-pressure chronic ulcer of other part of right lower leg with  fat layer 06/10/2020 No Yes exposed Z79.01 Long term (current) use of anticoagulants 06/10/2020 No Yes I25.10 Atherosclerotic heart disease of native coronary artery without angina 06/10/2020 No Yes pectoris Inactive Problems Resolved Problems Electronic Signature(s) Signed: 07/30/2020 1:08:36 PM By: Lenda Kelp PA-C Entered By: Lenda Kelp on 07/30/2020 13:08:36 Jeff Rios ([id number]) -------------------------------------------------------------------------------- Progress Note Details Patient Name: Jeff Rios Date of Service: 07/30/2020 12:45 PM Medical Record Number: [id number] Patient Account Number: [id number] Date of Birth/Sex: [birth year] (55 y.o. M) Treating RN: Rogers Blocker Primary Care Provider: PATIENT, NO Other Clinician: Lolita Cram Referring Provider: Cam Hai Treating Provider/Extender: Rowan Blase in Treatment: 7 Subjective Chief Complaint Information obtained from Patient Right LE History of Present Illness (HPI) 06/10/2020 upon evaluation today patient presents for initial evaluation here in our clinic concerning issue with a wound which began on 29 December. Subsequently the patient tells me that this is a work comp related injury that he was working at [employer] when one of the bundles of cardboard boxes that they compile unfortunately fell off of a what sounds to be dolly struck him in the back of the leg. He initially had a small laceration. Unfortunately this developed into a much bigger issue as noted today where there is necrotic tissue in the base  of the wound as well. There is a lot of swelling around the edges of the wound as well. It obviously seems like this turned into a hematoma which is not farfetched considering the fact that the patient is on long-term anticoagulant therapy secondary to his cardiac history. He does have a history of myocardial infarction even at such a young age. He is on Eliquis I do believe at this point. Subsequently the patient states that he does have some discomfort here is not excruciating but nonetheless it is problematic for him. 06/17/2020 upon evaluation today patient appears to be doing better in regard to his wound on the posterior aspect of his right leg. He has been tolerating the dressing changes without complication even in 1 week's time I do see a significant improvement here which is great news. There does not appear to be any evidence of active infection at this point which is excellent. Overall I am extremely pleased with how things seem to be progressing. 06/25/2020 upon evaluation today patient appears to be doing well with regard to his wound. This is measuring smaller and looks to be doing much better I do think the [facility] is doing a good job. 07/02/2020 upon evaluation today patient appears to be making excellent progress at this point. Fortunately there is no sign of active infection which is great news and in general I am extremely pleased with where things stand today. No fevers, chills, nausea, vomiting, or diarrhea. 07/09/2020 upon evaluation today patient appears to be doing well with regard to his leg ulcer. This is measuring much better and overall seems to be doing excellent. There is no evidence of active infection at this time. No fevers, chills, nausea, vomiting, or diarrhea. 07/16/2020 upon evaluation today patient appears to be doing well today in regard to his wound. He has been tolerating the dressing changes without complication. Fortunately there is no signs of active  infection at this time. No fevers, chills, nausea, vomiting, or diarrhea. 07/23/2020 upon evaluation today patient appears to be doing excellent in regard to his wound. This is not quite completely healed but does seem to be doing much better than last week. It significantly smaller and very  close to complete resolution. Fortunately there does not appear to be any evidence of infection right now which is great news. He is seen with his case manager in the office today as well. 07/30/2020 upon evaluation today patient appears to be doing well with regard to his wound on the leg. In fact this appears to be completely healed which is excellent news. Overall I am extremely pleased with where things stand. Objective Constitutional Well-nourished and well-hydrated in no acute distress. Vitals Time Taken: 12:59 PM, Height: 63 in, Weight: 182 lbs, BMI: 32.2, Temperature: 98 F, Pulse: 67 bpm, Respiratory Rate: 18 breaths/min, Blood Pressure: 129/84 mmHg. Respiratory normal breathing without difficulty. Psychiatric this patient is able to make decisions and demonstrates good insight into disease process. Alert and Oriented x 3. pleasant and cooperative. General Notes: Patient does not appear to show any signs of active infection at this time which is great news and overall I am extremely pleased. UKIAH, TRAWICK ([id number]) In fact he shows complete epithelization which is great news that is good to be really no residual deficit at this point though I did explain to the patient that he is going to need to make sure to wear his compression sock for really probably the next month just to make sure nothing swells as a result of the injury that could potentially reopen this. He voiced understanding. Integumentary (Hair, Skin) Wound #1 status is Open. Original cause of wound was Trauma. The date acquired was: 05/01/2020. The wound has been in treatment 7 weeks. The wound is located on the Right,Posterior  Lower Leg. The wound measures 0cm length x 0cm width x 0cm depth; 0cm^2 area and 0cm^3 volume. There is no tunneling or undermining noted. There is a none present amount of drainage noted. The wound margin is flat and intact. There is no granulation within the wound bed. There is no necrotic tissue within the wound bed. Assessment Active Problems ICD-10 Unspecified open wound, right lower leg, initial encounter Non-pressure chronic ulcer of other part of right lower leg with fat layer exposed Long term (current) use of anticoagulants Atherosclerotic heart disease of native coronary artery without angina pectoris Plan Discharge From [facility]: Discharge from Wound Care Center Treatment Complete - Continue wearing compression sock on right leg for a month Moisturize legs daily after removing compression garments. Additional Orders / Instructions: May return to work at full capacity. 1. I would go ahead and discontinue wound care services as the patient appears to be completely healed and has no residual deficits. 2. I am also can release him back to full duty as far as work is concerned. 3. I would also suggest that the patient continue to elevate his leg and wear his compression stocking for the next month after that time he really does not generally have a lot of swelling and he should be good to not continue the compression at that point. If anything changes however he should let me know as soon as possible. The patient is discharged from the clinic today is completely healed. He has no functional deficits and no evidence of anything that would call disability at this point. He is definitely at the point of maximal improvement in my opinion. Electronic Signature(s) Signed: 07/30/2020 1:33:12 PM By: Lenda Kelp PA-C Entered By: Lenda Kelp on 07/30/2020 13:33:12 Jeff Rios  ([id number]) -------------------------------------------------------------------------------- SuperBill Details Patient Name: Jeff Rios Date of Service: 07/30/2020 Medical Record Number: [id number] Patient Account Number: [id number] Date of Birth/Sex: 18-May-1965 (55 y.o.  M) Treating RN: Rogers Blocker Primary Care Provider: PATIENT, NO Other Clinician: Lolita Cram Referring Provider: Cam Hai Treating Provider/Extender: Rowan Blase in Treatment: 7 Diagnosis Coding ICD-10 Codes Code Description 6070551797 Unspecified open wound, right lower leg, initial encounter L97.812 Non-pressure chronic ulcer of other part of right lower leg with fat layer exposed Z79.01 Long term (current) use of anticoagulants I25.10 Atherosclerotic heart disease of native coronary artery without angina pectoris Facility Procedures CPT4 Code: [id number] Description: [id number] - WOUND CARE VISIT-LEV 2 EST PT Modifier: Quantity: 1 Physician Procedures CPT4 Code: [id number] Description: 99213 - WC PHYS LEVEL 3 - EST PT Modifier: Quantity: 1 CPT4 Code: Description: ICD-10 Diagnosis Description S81.801A Unspecified open wound, right lower leg, initial encounter L97.812 Non-pressure chronic ulcer of other part of right lower leg with fat la Z79.01 Long term (current) use of anticoagulants I25.10  Atherosclerotic heart disease of native coronary artery without angina Modifier: yer exposed pectoris Quantity: Electronic Signature(s) Signed: 07/30/2020 1:33:55 PM By: Lenda Kelp PA-C Entered By: Lenda Kelp on 07/30/2020 13:33:55

## 2020-08-06 ENCOUNTER — Ambulatory Visit: Payer: Medicare Other | Admitting: Physician Assistant

## 2020-08-07 ENCOUNTER — Telehealth: Payer: Self-pay | Admitting: Physician Assistant

## 2020-08-07 NOTE — Telephone Encounter (Signed)
Spoke to pt.  Pt requesting transportation via [facility] for his follow up appt 08/12/20 @ 1:30 PM with Marisue Ivan, PA.  Transportation has been set up and confirmed with [name] transportation services.  Pt aware that he will receive a phone call confirming pick up date/ time.

## 2020-08-07 NOTE — Telephone Encounter (Signed)
Patient calling to discuss transportation.

## 2020-08-09 NOTE — Progress Notes (Signed)
Jeff Rios, Jeff Rios ([id number]) Visit Report for 07/30/2020 Arrival Information Details Patient Name: Jeff Rios, Jeff Rios Date of Service: 07/30/2020 12:45 PM Medical Record Number: [id number] Patient Account Number: [id number] Date of Birth/Sex: 1965-10-22 (55 y.o. M) Treating RN: Yevonne Pax Primary Care Keaunna Skipper: PATIENT, NO Other Clinician: Lolita Cram Referring Djon Tith: Cam Hai Treating Izreal Kock/Extender: Rowan Blase in Treatment: 7 Visit Information History Since Last Visit All ordered tests and consults were completed: No Patient Arrived: Ambulatory Added or deleted any medications: No Arrival Time: 12:58 Any new allergies or adverse reactions: No Accompanied By: self Had a fall or experienced change in No Transfer Assistance: None activities of daily living that may affect Patient Identification Verified: Yes risk of falls: Secondary Verification Process Completed: Yes Signs or symptoms of abuse/neglect since last visito No Patient Requires Transmission-Based No Hospitalized since last visit: No Precautions: Implantable device outside of the clinic excluding No Patient Has Alerts: Yes cellular tissue based products placed in the center Patient Alerts: Patient on Blood since last visit: Thinner Has Dressing in Place as Prescribed: Yes PLAVIX Has Compression in Place as Prescribed: Yes Pain Present Now: No Electronic Signature(s) Signed: 08/09/2020 8:11:40 AM By: Yevonne Pax RN Entered By: Yevonne Pax on 07/30/2020 12:59:34 Jeff Rios ([id number]) -------------------------------------------------------------------------------- Clinic Level of Care Assessment Details Patient Name: Jeff Rios Date of Service: 07/30/2020 12:45 PM Medical Record Number: [id number] Patient Account Number: [id number] Date of Birth/Sex: 12/28/1965 (55 y.o. M) Treating RN: Rogers Blocker Primary Care Burnice Oestreicher: PATIENT, NO Other Clinician: Lolita Cram Referring Nafisa Olds: Cam Hai Treating Niki Payment/Extender: Rowan Blase in Treatment: 7 Clinic Level of Care Assessment Items TOOL 4 Quantity Score X - Use when only an EandM is performed on FOLLOW-UP visit 1 0 ASSESSMENTS - Nursing Assessment / Reassessment X - Reassessment of Co-morbidities (includes updates in patient status) 1 10 X- 1 5 Reassessment of Adherence to Treatment Plan ASSESSMENTS - Wound and Skin Assessment / Reassessment X - Simple Wound Assessment / Reassessment - one wound 1 5 []  - 0 Complex Wound Assessment / Reassessment - multiple wounds []  - 0 Dermatologic / Skin Assessment (not related to wound area) ASSESSMENTS - Focused Assessment []  - Circumferential Edema Measurements - multi extremities 0 []  - 0 Nutritional Assessment / Counseling / Intervention []  - 0 Lower Extremity Assessment (monofilament, tuning fork, pulses) []  - 0 Peripheral Arterial Disease Assessment (using hand held doppler) ASSESSMENTS - Ostomy and/or Continence Assessment and Care []  - Incontinence Assessment and Management 0 []  - 0 Ostomy Care Assessment and Management (repouching, etc.) PROCESS - Coordination of Care X - Simple Patient / Family Education for ongoing care 1 15 []  - 0 Complex (extensive) Patient / Family Education for ongoing care []  - 0 Staff obtains , Records, Test Results / Process Orders []  - 0 Staff telephones HHA, Nursing Homes / Clarify orders / etc []  - 0 Routine Transfer to another Facility (non-emergent condition) []  - 0 Routine Hospital Admission (non-emergent condition) []  - 0 New Admissions / / Ordering NPWT, Apligraf, etc. []  - 0 Emergency Hospital Admission (emergent condition) X- 1 10 Simple Discharge Coordination []  - 0 Complex (extensive) Discharge Coordination PROCESS - Special Needs []  - Pediatric / Minor Patient Management 0 []  - 0 Isolation Patient Management []  - 0 Hearing / Language /  Visual special needs []  - 0 Assessment of Community assistance (transportation, D/C planning, etc.) []  - 0 Additional assistance / Altered mentation []  - 0 Support Surface(s) Assessment (bed, cushion, seat, etc.) INTERVENTIONS - Wound  Cleansing / Measurement Jeff Rios, Jeff Rios ([id number]) X- 1 5 Simple Wound Cleansing - one wound []  - 0 Complex Wound Cleansing - multiple wounds X- 1 5 Wound Imaging (photographs - any number of wounds) []  - 0 Wound Tracing (instead of photographs) X- 1 5 Simple Wound Measurement - one wound []  - 0 Complex Wound Measurement - multiple wounds INTERVENTIONS - Wound Dressings []  - Small Wound Dressing one or multiple wounds 0 []  - 0 Medium Wound Dressing one or multiple wounds []  - 0 Large Wound Dressing one or multiple wounds []  - 0 Application of Medications - topical []  - 0 Application of Medications - injection INTERVENTIONS - Miscellaneous []  - External ear exam 0 []  - 0 Specimen Collection (cultures, biopsies, blood, body fluids, etc.) []  - 0 Specimen(s) / Culture(s) sent or taken to Lab for analysis []  - 0 Patient Transfer (multiple staff / / Similar devices) []  - 0 Simple Staple / Suture removal (25 or less) []  - 0 Complex Staple / Suture removal (26 or more) []  - 0 Hypo / Hyperglycemic Management (close monitor of Blood Glucose) []  - 0 Ankle / Brachial Index (ABI) - do not check if billed separately X- 1 5 Vital Signs Has the patient been seen at the hospital within the last three years: Yes Total Score: 65 Level Of Care: New/Established - Level 2 Electronic Signature(s) Signed: 07/30/2020 5:07:41 PM By: , RN Entered By: , on 07/30/2020 13:20:29 ( ) -------------------------------------------------------------------------------- Encounter Discharge Information Details Patient Name: Date of Service: 07/30/2020 12:45  PM Medical Record Number: Patient Account Number: Nurse, adult Date of Birth/Sex: 10-29-1965 (55 y.o. M) Treating RN: Primary Care Osbaldo Mark: PATIENT, NO Other Clinician: Referring Kindle Strohmeier: 08/01/2020 Treating Zalika Tieszen/Extender: Phillis Haggis in Treatment: 7 Encounter Discharge Information Items Discharge Condition: Stable Ambulatory Status: Ambulatory Discharge Destination: Home Transportation: Private Auto Accompanied By: case worker Schedule Follow-up Appointment: Yes Clinical Summary of Care: Electronic Signature(s) Signed: 07/30/2020 5:07:41 PM By: Phillis Haggis, Dondra Prader RN Entered By: 08/01/2020, Jeff Rios on 07/30/2020 13:21:24 Jeff Rios (08/01/2020) -------------------------------------------------------------------------------- Lower Extremity Assessment Details Patient Name: [id number] Date of Service: 07/30/2020 12:45 PM Medical Record Number: 08/02/1965 Patient Account Number: 40 Date of Birth/Sex: 1965/05/28 (55 y.o. M) Treating RN: Cam Hai Primary Care Tysheem Accardo: PATIENT, NO Other Clinician: Rowan Blase Referring Jaylyn Iyer: 08/01/2020 Treating Abria Vannostrand/Extender: Phillis Haggis in Treatment: 7 Vascular Assessment Pulses: Dorsalis Pedis Palpable: [Right:Yes] Electronic Signature(s) Signed: 08/09/2020 8:11:40 AM By: Phillis Haggis RN Entered By: Dondra Prader on 07/30/2020 13:08:36 Jeff Rios ([id number]) -------------------------------------------------------------------------------- Multi Wound Chart Details Patient Name: Jeff Rios Date of Service: 07/30/2020 12:45 PM Medical Record Number: [id number] Patient Account Number: [id number] Date of Birth/Sex: 04-Jul-1965 (55 y.o. M) Treating RN: Yevonne Pax Primary Care Illyanna Petillo: PATIENT, NO Other Clinician: Lolita Cram Referring Amore Grater: Cam Hai Treating Elea Holtzclaw/Extender: Rowan Blase in Treatment: 7 Vital  Signs Height(in): 63 Pulse(bpm): 67 Weight(lbs): 182 Blood Pressure(mmHg): 129/84 Body Mass Index(BMI): 32 Temperature(F): 98 Respiratory Rate(breaths/min): 18 Photos: [N/A:N/A] Wound Location: Right, Posterior Lower Leg N/A N/A Wounding Event: Trauma N/A N/A Primary Etiology: Trauma, Other N/A N/A Comorbid History: Coronary Artery Disease, N/A N/A Hypertension, Myocardial Infarction Date Acquired: 05/01/2020 N/A N/A Weeks of Treatment: 7 N/A N/A Wound Status: Open N/A N/A Measurements L x W x D (cm) 0x0x0 N/A N/A Area (cm) : 0 N/A N/A Volume (cm) : 0 N/A N/A % Reduction in Area: 100.00% N/A N/A % Reduction in  Volume: 100.00% N/A N/A Classification: Full Thickness Without Exposed N/A N/A Support Structures Exudate Amount: None Present N/A N/A Wound Margin: Flat and Intact N/A N/A Granulation Amount: None Present (0%) N/A N/A Necrotic Amount: None Present (0%) N/A N/A Exposed Structures: Fascia: No N/A N/A Fat Layer (Subcutaneous Tissue): No Tendon: No Muscle: No Joint: No Bone: No Epithelialization: Large (67-100%) N/A N/A Treatment Notes Electronic Signature(s) Signed: 07/30/2020 5:07:41 PM By: Phillis HaggisSanchez Pereyda, Dondra PraderKenia RN Entered By: Phillis HaggisSanchez Pereyda, Dondra PraderKenia on 07/30/2020 13:15:36 Jeff Rios, Jeff Rios ([id number]) -------------------------------------------------------------------------------- Multi-Disciplinary Care Plan Details Patient Name: Jeff Rios, Jeff Rios Date of Service: 07/30/2020 12:45 PM Medical Record Number: [id number] Patient Account Number: [id number] Date of Birth/Sex: 10-Aug-1965 (55 y.o. M) Treating RN: Rogers BlockerSanchez, Kenia Primary Care Eisa Necaise: PATIENT, NO Other Clinician: Lolita CramBurnette, Kyara Referring Ancelmo Hunt: Cam Haiarr, Jacob Treating Demetrias Goodbar/Extender: Rowan BlaseStone, Hoyt Weeks in Treatment: 7 Active Inactive Electronic Signature(s) Signed: 07/30/2020 5:07:41 PM By: Phillis HaggisSanchez Pereyda, Dondra PraderKenia RN Entered By: Phillis HaggisSanchez Pereyda, Dondra PraderKenia on 07/30/2020 13:15:30 Jeff Rios,  Jeff Rios ([id number]) -------------------------------------------------------------------------------- Pain Assessment Details Patient Name: Jeff Rios, Happy Date of Service: 07/30/2020 12:45 PM Medical Record Number: [id number] Patient Account Number: [id number] Date of Birth/Sex: 10-Aug-1965 (55 y.o. M) Treating RN: Yevonne PaxEpps, Carrie Primary Care Kaylan Yates: PATIENT, NO Other Clinician: Lolita CramBurnette, Kyara Referring Stasha Naraine: Cam Haiarr, Jacob Treating Kofi Murrell/Extender: Rowan BlaseStone, Hoyt Weeks in Treatment: 7 Active Problems Location of Pain Severity and Description of Pain Patient Has Paino No Site Locations Rate the pain. Current Pain Level: 0 Pain Management and Medication Current Pain Management: Electronic Signature(s) Signed: 08/09/2020 8:11:40 AM By: Yevonne PaxEpps, Carrie RN Entered By: Yevonne PaxEpps, Carrie on 07/30/2020 13:01:31 Jeff Rios, Jeff Rios ([id number]) -------------------------------------------------------------------------------- Patient/Caregiver Education Details Patient Name: Jeff Rios, Jeff Rios Date of Service: 07/30/2020 12:45 PM Medical Record Number: [id number] Patient Account Number: [id number] Date of Birth/Gender: 10-Aug-1965 (55 y.o. M) Treating RN: Rogers BlockerSanchez, Kenia Primary Care Physician: PATIENT, NO Other Clinician: Lolita CramBurnette, Kyara Referring Physician: Cam Haiarr, Jacob Treating Physician/Extender: Rowan BlaseStone, Hoyt Weeks in Treatment: 7 Education Assessment Education Provided To: Patient Education Topics Provided Notes Wearing compression on right leg for a month, don't scrub new skin Electronic Signature(s) Signed: 07/30/2020 5:07:41 PM By: Phillis HaggisSanchez Pereyda, Dondra PraderKenia RN Entered By: Phillis HaggisSanchez Pereyda, Dondra PraderKenia on 07/30/2020 13:20:59 Jeff Rios, Isao ([id number]) -------------------------------------------------------------------------------- Wound Assessment Details Patient Name: Jeff Rios, Jordon Date of Service: 07/30/2020 12:45 PM Medical Record Number: [id number] Patient Account Number: [id number] Date  of Birth/Sex: 10-Aug-1965 (55 y.o. M) Treating RN: Yevonne PaxEpps, Carrie Primary Care Montford Barg: PATIENT, NO Other Clinician: Lolita CramBurnette, Kyara Referring Rovena Hearld: Cam Haiarr, Jacob Treating Lisa Milian/Extender: Rowan BlaseStone, Hoyt Weeks in Treatment: 7 Wound Status Wound Number: 1 Primary Trauma, Other Etiology: Wound Location: Right, Posterior Lower Leg Wound Status: Healed - Epithelialized Wounding Event: Trauma Comorbid Coronary Artery Disease, Hypertension, Myocardial Date Acquired: 05/01/2020 History: Infarction Weeks Of Treatment: 7 Clustered Wound: No Photos Wound Measurements Length: (cm) 0 Width: (cm) 0 Depth: (cm) 0 Area: (cm) 0 Volume: (cm) 0 % Reduction in Area: 100% % Reduction in Volume: 100% Epithelialization: Large (67-100%) Tunneling: No Undermining: No Wound Description Classification: Full Thickness Without Exposed Support Structures Wound Margin: Flat and Intact Exudate Amount: None Present Foul Odor After Cleansing: No Slough/Fibrino No Wound Bed Granulation Amount: None Present (0%) Exposed Structure Necrotic Amount: None Present (0%) Fascia Exposed: No Fat Layer (Subcutaneous Tissue) Exposed: No Tendon Exposed: No Muscle Exposed: No Joint Exposed: No Bone Exposed: No Treatment Notes Wound #1 (Lower Leg) Wound Laterality: Right, Posterior Cleanser Peri-Wound Care Topical Primary Dressing Jeff Rios, Tyjai ([id number]) Secondary Dressing Secured With Compression Wrap Compression Stockings Add-Ons Electronic Signature(s) Signed: 07/31/2020 10:09:20 AM By: Lajean ManesSanchez Pereyda, Kenia RN  Signed: 08/09/2020 8:11:40 AM By: Yevonne Pax RN Entered By: Phillis Haggis, Dondra Prader on 07/31/2020 10:09:20 Jeff Rios ([id number]) -------------------------------------------------------------------------------- Vitals Details Patient Name: Jeff Rios Date of Service: 07/30/2020 12:45 PM Medical Record Number: [id number] Patient Account Number: [id number] Date of  Birth/Sex: [birth year] (55 y.o. M) Treating RN: Yevonne Pax Primary Care Veria Stradley: PATIENT, NO Other Clinician: Lolita Cram Referring Brynlei Klausner: Cam Hai Treating Erbie Arment/Extender: Rowan Blase in Treatment: 7 Vital Signs Time Taken: 12:59 Temperature (F): 98 Height (in): 63 Pulse (bpm): 67 Weight (lbs): 182 Respiratory Rate (breaths/min): 18 Body Mass Index (BMI): 32.2 Blood Pressure (mmHg): 129/84 Reference Range: 80 - 120 mg / dl Electronic Signature(s) Signed: 08/09/2020 8:11:40 AM By: Yevonne Pax RN Entered By: Yevonne Pax on 07/30/2020 13:01:16

## 2020-08-11 NOTE — Progress Notes (Signed)
Office Visit    Patient Name: Jeff Rios Date of Encounter: 08/14/2020  PCP:  Patient, [name] (Inactive)    [facility]  Cardiologist:  Lorine Bears, MD  Advanced Practice Provider:  Lennon Alstrom, PA-C Electrophysiologist:  None :21  Chief Complaint    Chief Complaint  Patient presents with  . Follow-up    Follow up after cath and medications verbally reviewed with patient.     55 y.o. male with history of CAD, NSTEMI 03/18/2019 s/p PCI/DES to RCA and LAD, HTN, HLD, tobacco use (quit July 2021), idiopathic angiedema, and who presents today for follow-up after recent catheterization without intervention.  Past Medical History    Past Medical History:  Diagnosis Date  . CAD (coronary artery disease)   . HLD (hyperlipidemia)   . Hypertension    Past Surgical History:  Procedure Laterality Date  . CARDIAC CATHETERIZATION    . CORONARY STENT INTERVENTION N/A 03/18/2019   Procedure: CORONARY STENT INTERVENTION;  Surgeon: Iran Ouch, MD;  Location: [facility] INVASIVE CV [facility];  Service: Cardiovascular;  Laterality: N/A;  . CORONARY STENT INTERVENTION N/A 03/20/2019   Procedure: CORONARY STENT INTERVENTION;  Surgeon: Iran Ouch, MD;  Location: [facility];  Service: Cardiovascular;  Laterality: N/A;  . CORONARY/GRAFT ACUTE MI REVASCULARIZATION N/A 03/18/2019   Procedure: Coronary/Graft Acute MI Revascularization;  Surgeon: Iran Ouch, MD;  Location: [facility];  Service: Cardiovascular;  Laterality: N/A;  . LEFT HEART CATH AND CORONARY ANGIOGRAPHY N/A 03/18/2019   Procedure: LEFT HEART CATH AND CORONARY ANGIOGRAPHY;  Surgeon: Iran Ouch, MD;  Location: [facility];  Service: Cardiovascular;  Laterality: N/A;  . LEFT HEART CATH AND CORONARY ANGIOGRAPHY N/A 07/29/2020   Procedure: LEFT HEART CATH AND CORONARY ANGIOGRAPHY;  Surgeon: Iran Ouch, MD;  Location: [facility];   Service: Cardiovascular;  Laterality: N/A;    Allergies  Allergies  Allergen Reactions  . Amlodipine Swelling    Patient states he had an allergic reaction and was possibly from amlodipine. Taken off that and switched to metoprolol.   . Imdur [Isosorbide Nitrate] Other (See Comments)    Severe headache    History of Present Illness    Jeff Rios is a 55 y.o. 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  Previously, there was concern regarding a possible reaction to amlodipine, subsequently switched to metoprolol.  He has subsequently been tolerating amlodipine.    He was admitted 03/2019 with inferior ST elevation myocardial infarction.  He reported severe substernal chest discomfort that woke him from sleep.  Cardiac catheterization showed significant 2v CAD with an occluded distal RCA and significant mLAD stenosis.  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  He underwent successful PCI and DES to the distal RCA with staged LAD PCI.    He was last seen in clinic by his primary cardiologist, Dr. Kirke Corin, 02/01/2020.  He was doing well without CP or DOE.  He had run out of atorvastatin, carvedilol, and clopidogrel.  He continued to have difficulty obtaining his medications due to lack of transportation and cost of co-pays.  He had quit smoking 2 months prior. He later stopped Imdur for HA.     He was seen in the ED 05/05/2020 for right ankle sprain/leg injury.  Seen 05/17/20 with breathing difficulties, described as similar to before his PCI.  He noted racing HR at rest. He was taking his medications via a bubble pack. He was concerned for a blockage. He reported ongoing smoking cessation. He was not regularly checking his blood pressure.  He had mild lower extremity edema.He was drinking  more soda / juice than water.   Subsequent NM study unrevealing and stable echo as below. RVSP 28.6 with nl PASP, G1DD, mild MR, mild LVH.   Seen 07/08/20 with DOE and 2d history of burning central CP with associated nausea. No association with food.  Sx lasted up to 30 minutes and resolved with rest. No SOB or CP at rest. CP ntoed as similar to before his interventions, though not as severe. He reported some element of deconditioning, as that he had not worked out in 1 year. He was not regularly taking his blood pressure. He noted weight gain and lower extremity edema of his right lower extremity, which he associated with his recent Achilles tendon injury, but with concern noted for increased volume per pt. Wt noted to be up from previous clinic weights at 192. EKG showed new TWI in I but otherwise no acute changes. He noted concern over CP sx with recommendation for further workup with OP cath for further stratification +/- start of diuresis in the future.   07/30/20 LHC performed with previously placed mLAD DES patent, rPDA 30%s, pRCA 30%s, non-stenotic dRCA previously treated and noted. No intervention performed with widely patent RCA and LAD stents without significant stenosis and no significant change since recent LHC. LV angiography not performed and echo noted to have nl EF. LVEDP moderately elevated with SBP elevated during LHC. Recommendation was for BP control and medical management.   Today, 08/12/20, he returns to clinic and notes he is doing well without further CP since his cath. R radial arteriotomy site inspected with pt report that no pain, ROM nl, and no recent s/sx of infection s/p LHC. No recent  reported tachypalpitations, presyncope, syncope, or falls, No reported s/sx of volume overload since LHC. He reports walking at least 3 miles to and from work at least twice per week at a minimum. He is trying to eat healthy and increase activity with recommendations discussed today and pt eager to  make changes to improve his health. No regular workout routine - discussed possible ways to increase activity and encouraged walking even on his days off. He reports medication compliance with his bubble pack.   Home Medications    Current Outpatient Medications on File Prior to Visit  Medication Sig Dispense Refill  . Acetaminophen 500 MG capsule SMARTSIG:2 Capsule(s) By Mouth 3 Times Daily PRN    . amLODipine (NORVASC) 10 MG tablet Take 1 tablet (10 mg total) by mouth daily. 30 tablet 11  . aspirin 81 MG EC tablet Take 1 tablet (81 mg total) by mouth daily. 30 tablet 11  . atorvastatin (LIPITOR) 80 MG tablet Take 1 tablet (80 mg total) by mouth daily. 30 tablet 11  . butalbital-acetaminophen-caffeine (FIORICET) 50-325-40 MG tablet Take 1-2 tablets by mouth every 6 (six) hours as needed for headache. 20 tablet 0  . carvedilol (COREG) 3.125 MG tablet Take 1 tablet (3.125 mg total) by mouth 2 (two) times daily with a meal. 60 tablet 11  . clopidogrel (PLAVIX) 75 MG tablet Take 1 tablet (75 mg total) by mouth daily. 30 tablet 11  . lisinopril (ZESTRIL) 20 MG tablet Take 1 tablet (  20 mg total) by mouth daily. 30 tablet 11  . naproxen (NAPROSYN) 500 MG tablet Take 500 mg by mouth 3 (three) times daily.    . nitroGLYCERIN (NITROSTAT) 0.4 MG SL tablet Place 1 tablet (0.4 mg total) under the tongue every 5 (five) minutes as needed for chest pain. 30 tablet 12   Current Facility-Administered Medications on File Prior to Visit  Medication Dose Route Frequency Provider Last Rate Last Admin  . sodium chloride flush (NS) 0.9 % injection 3 mL  3 mL Intravenous Q12H Marisue Ivan D, PA-C        Review of Systems    He denies further CP with exertion or at rest. No sx of palpitations, pnd, orthopnea, v, dizziness, syncope, LEE, wt gain, nausea, emesis, SOB/DOE, or early satiety.  All other systems reviewed and are otherwise negative except as noted above.  Physical Exam    VS:  BP 120/80 (BP  Location: Left Arm, Patient Position: Sitting, Cuff Size: Normal)   Pulse 76   Ht 5\' 3"  (1.6 m)   Wt 185 lb (83.9 kg)   SpO2 97%   BMI 32.77 kg/m  , BMI Body mass index is 32.77 kg/m. GEN: Well nourished, well developed, in no acute distress. HEENT: normal. Neck: Supple, no JVD, carotid bruits, or masses. Cardiac: RRR, no murmurs, rubs, or gallops. No clubbing, cyanosis. R radial arteriotomy site inspected and without signs of hematoma, erythema, or evidence of infection and ROM intact- no bruit. Nonpitting mild b/l depenedent LEE edema noted. Radials/DP/PT 2+ and equal bilaterally.  Respiratory:  Respirations regular and unlabored, clear to auscultation bilaterally. GI: Soft, nontender, nondistended, BS + x 4. MS: no deformity or atrophy. Skin: warm and dry, no rash.  Neuro:  Strength and sensation are intact. Psych: Normal affect.  Accessory Clinical Findings    ECG personally reviewed by me today - NSR, 76 bpm, PR interval 160 ms, TWI V5-6, TWI in I, avL - no acute changes.  VITALS Reviewed today   Temp Readings from Last 3 Encounters:  07/29/20 97.7 F (36.5 C) (Oral)  05/05/20 98.1 F (36.7 C) (Oral)  11/12/19 97.9 F (36.6 C) (Oral)   BP Readings from Last 3 Encounters:  08/12/20 120/80  07/29/20 138/75  07/08/20 128/72   Pulse Readings from Last 3 Encounters:  08/12/20 76  07/29/20 78  07/08/20 84    Wt Readings from Last 3 Encounters:  08/12/20 185 lb (83.9 kg)  07/08/20 192 lb (87.1 kg)  05/17/20 184 lb (83.5 kg)     LABS  reviewed today   Lab Results  Component Value Date   WBC 4.9 07/26/2020   HGB 14.2 07/26/2020   HCT 40.8 07/26/2020   MCV 82 07/26/2020   PLT 264 07/26/2020   Lab Results  Component Value Date   CREATININE 0.88 07/26/2020   BUN 13 07/26/2020   NA 142 07/26/2020   K 4.0 07/26/2020   CL 107 (H) 07/26/2020   CO2 19 (L) 07/26/2020   Lab Results  Component Value Date   ALT 34 09/05/2019   AST 25 09/05/2019   ALKPHOS 60  09/05/2019   BILITOT 1.1 09/05/2019   Lab Results  Component Value Date   CHOL 215 (H) 09/05/2019   HDL 61 09/05/2019   LDLCALC 131 (H) 09/05/2019   TRIG 117 09/05/2019   CHOLHDL 3.5 09/05/2019    Lab Results  Component Value Date   HGBA1C 5.4 03/18/2019   No results found for: TSH  STUDIES/PROCEDURES reviewed today   LHC 07/29/20  Previously placed Mid LAD drug eluting stent is widely patent.  RPDA lesion is 30% stenosed.  Prox RCA lesion is 30% stenosed.  Non-stenotic Dist RCA lesion was previously treated. 1.  Widely patent RCA and LAD stents with no significant restenosis.  No significant change from most recent cardiac catheterization. 2.  Left ventricular angiography was not performed.  EF was normal by echo. 3.  Left ventricular end-diastolic pressure was moderately elevated in the 20s range but his systemic blood pressure was elevated during cardiac catheterization. Recommendations: Continue medical therapy and blood pressure control. Coronary Diagrams   Diagnostic Dominance: Right     Echo 06/28/20 1. Left ventricular ejection fraction, by estimation, is 60 to 65%. The  left ventricle has normal function. The left ventricle has no regional  wall motion abnormalities. There is mild left ventricular hypertrophy.  Left ventricular diastolic parameters  are consistent with Grade I diastolic dysfunction (impaired relaxation).  2. Right ventricular systolic function is normal. The right ventricular  size is normal. There is normal pulmonary artery systolic pressure. The  estimated right ventricular systolic pressure is 28.6 mmHg.  3. The mitral valve is normal in structure. Mild mitral valve  regurgitation.   NM Study 07/03/20  There was no ST segment deviation noted during stress.  No T wave inversion was noted during stress.  The study is normal.  This is a low risk study.  The left ventricular ejection fraction is hyperdynamic (>72%).  There  is no evidence for ischemia   PCI 03/20/2019  RPDA lesion is 40% stenosed.  Prox RCA lesion is 30% stenosed.  Previously placed Dist RCA drug eluting stent is widely patent.  Balloon angioplasty was performed.  Mid LAD lesion is 80% stenosed.  Post intervention, there is a 0% residual stenosis.  A drug-eluting stent was successfully placed using a STENT RESOLUTE ONYX 3.0X15. Successful angioplasty and drug-eluting stent placement to the mid LAD. Recommendations: Continue dual antiplatelet therapy for at least 1 year. Aggressive treatment of risk factors. Likely discharge home tomorrow. He is to stay off work for 2 weeks.   Echo 03/18/19 1. Left ventricular ejection fraction, by visual estimation, is 60 to  65%. The left ventricle has normal function. There is mildly increased  left ventricular hypertrophy.  2. Left ventricular diastolic parameters are consistent with Grade I  diastolic dysfunction (impaired relaxation).  3. Global right ventricle has normal systolic function.The right  ventricular size is normal. No increase in right ventricular wall  thickness.  4. The mitral valve is normal in structure. No evidence of mitral valve  regurgitation. No evidence of mitral stenosis.  5. The tricuspid valve is normal in structure. Tricuspid valve  regurgitation is not demonstrated.  6. The aortic valve is normal in structure. Aortic valve regurgitation is  not visualized. No evidence of aortic valve sclerosis or stenosis.  7. The pulmonic valve was normal in structure. Pulmonic valve  regurgitation is trivial.  8. TR signal is inadequate for assessing pulmonary artery systolic  pressure.  9. The inferior vena cava is normal in size with greater than 50%  respiratory variability, suggesting right atrial pressure of 3 mmHg.   03/18/19 PCI  The left ventricular systolic function is normal.  LV end diastolic pressure is normal.  The left ventricular ejection  fraction is 55-65% by visual estimate.  Mid LAD lesion is 80% stenosed.  Prox RCA lesion is 30% stenosed.  Dist RCA lesion is 100%  stenosed.  Post intervention, there is a 0% residual stenosis.  A drug-eluting stent was successfully placed using a STENT RESOLUTE ONYX 2.25X22.  RPDA lesion is 40% stenosed. 1.  Significant two-vessel coronary artery disease.  The culprit is an occluded distal right coronary artery with faint left-to-right collaterals.  The supplied area is not large and that is the likely reason for nondiagnostic EKG changes for inferior STEMI.  There is also significant mid LAD stenosis which appears to be hazy. 2.  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  Significantly elevated LVEDP in the low 30s. 3.  Successful angioplasty and drug-eluting stent placement to the distal RCA. Recommendations: Continue dual antiplatelet therapy for at least 1 year.  Aggressive treatment of risk factors. Recommend staged PCI of the LAD before hospital discharge.  This is planned for Monday morning.   Assessment & Plan    CAD s/p PCI -- No further exertional sx since [facility] 07/2020 as above for progressive sx despite unrevealing echo and MPI and without intervention. He has history of CAD s/p PCI/DES to the RCA with staged PCI/DES to the LAD 03/2019. Most recent 07/2020 cath without significant changes from that of previous and showing patent stents. Reports resolution of all previous sx today with R radial arteriotomy site stable. Euvolemic and well compensated on exam.   No further ischemic workup.   Increased to Coreg 6.25mg  BID today (to be called in for bubble pack replacement) for DBP borderline today.  Continue DAPT with ASA and Plavix, increased dose Coreg, amlodipine 10mg  daily, statin, lisinopril 20mg  qd, and PRN SL nitro. Of note, no Imdur recommended due to  history of associated HA.  Due to financial barriers, Ranexa deferrred.   Diastolic dysfunction -- No further sx of DOE or concern for volume overload. Updated echo as above. Salt and fluid restrictions again reviewed. Increased BB to Coreg 6.25mg  BID for DBP control given borderline DBP of 80 today. Continue BB, CCB, ACE. No indication for diuretic today - reassess at RTC. Continue to recommend leg elevation for LEE and compression stockings for dependent edema.    HTN, goal BP 130/80 or lower --DBP borderline elevated but otherwise BP well controlled at 120/80. Encouraged BP checks at home. Previously supplied with a BP cuff from social work. Monitor salt and fluid intake, reviewed today in detail. Continue increased dose carvedilol 6.25mg  BID with current lisinopril and amlodipine.  Mobic is not recommended, as this can elevate BP and is not ideal from a cardiovascular and renal standpoint.  Increase activity as tolerated.   HLD, LDL goal <70 --PCP labs reviewed and included cholesterol labs from 02/2020 with total cholesterol 117, triglycerides 63, HDL 54, LDL 49 (02/27/2020). LDL controlled and at goal.  Continue current statin. Unable to afford Zetia on review of EMR in the past - will retrial start of Zetia and see if affordable at this time.   History of tobacco use --Ongoing cessation encouraged.  Medication management / bubble pack medications --Located the pharmacy responsible for blister pack and will contact with the Coreg increased dose / changes.  He reports compliance with his blister pack but is usually uncertain which medications are available in it.   Medication changes: Increased to Coreg 6.25mg  BID / Start Zetia 10mg  daily if affordable given EMR reports not affordable or covered by insurance in the past --> change both medications in blister pack --> contacted pharmacy  responsible for blister pack toay Labs ordered: None Studies / Imaging ordered: None Disposition: RTC 6  months  *Please be aware that the above documentation was completed voice recognition software and may contain associated dictation errors.     Lennon Alstrom, PA-C 08/12/2020

## 2020-08-12 ENCOUNTER — Encounter: Payer: Self-pay | Admitting: Physician Assistant

## 2020-08-12 ENCOUNTER — Ambulatory Visit (INDEPENDENT_AMBULATORY_CARE_PROVIDER_SITE_OTHER): Payer: Medicare Other | Admitting: Physician Assistant

## 2020-08-12 ENCOUNTER — Other Ambulatory Visit: Payer: Self-pay

## 2020-08-12 VITALS — BP 120/80 | HR 76 | Ht 63.0 in | Wt 185.0 lb

## 2020-08-12 DIAGNOSIS — I10 Essential (primary) hypertension: Secondary | ICD-10-CM | POA: Diagnosis not present

## 2020-08-12 DIAGNOSIS — I5032 Chronic diastolic (congestive) heart failure: Secondary | ICD-10-CM

## 2020-08-12 DIAGNOSIS — E785 Hyperlipidemia, unspecified: Secondary | ICD-10-CM

## 2020-08-12 DIAGNOSIS — Z79899 Other long term (current) drug therapy: Secondary | ICD-10-CM

## 2020-08-12 DIAGNOSIS — Z599 Problem related to housing and economic circumstances, unspecified: Secondary | ICD-10-CM

## 2020-08-12 DIAGNOSIS — I252 Old myocardial infarction: Secondary | ICD-10-CM

## 2020-08-12 DIAGNOSIS — I2510 Atherosclerotic heart disease of native coronary artery without angina pectoris: Secondary | ICD-10-CM | POA: Diagnosis not present

## 2020-08-12 DIAGNOSIS — I214 Non-ST elevation (NSTEMI) myocardial infarction: Secondary | ICD-10-CM

## 2020-08-12 DIAGNOSIS — Z87891 Personal history of nicotine dependence: Secondary | ICD-10-CM

## 2020-08-12 MED ORDER — CARVEDILOL 6.25 MG PO TABS: 6.2500 mg | ORAL_TABLET | Freq: Two times a day (BID) | ORAL | 11 refills | Status: DC

## 2020-08-12 MED ORDER — EZETIMIBE 10 MG PO TABS: 10.0000 mg | ORAL_TABLET | Freq: Every day | ORAL | 11 refills | Status: DC

## 2020-08-12 NOTE — Patient Instructions (Signed)
Medication Instructions:   Your physician has recommended you make the following change in your medication:   INCREASE Carvedilol to 6.25mg  TWICE daily -  This is for blood pressure/ heart function  START Zetia 10mg  daily - This is for cholesterol   *If you need a refill on your cardiac medications before your next appointment, please call your pharmacy*   Lab Work:  None ordered   Testing/Procedures:   None ordered   Follow-Up: At [facility], you and your health needs are our priority.  As part of our continuing mission to provide you with exceptional heart care, we have created designated Provider Care Teams.  These Care Teams include your primary Cardiologist (physician) and Advanced Practice Providers (APPs -  Physician Assistants and Nurse Practitioners) who all work together to provide you with the care you need, when you need it.  We recommend signing up for the patient portal called "MyChart".  Sign up information is provided on this After Visit Summary.  MyChart is used to connect with patients for Virtual Visits (Telemedicine).  Patients are able to view lab/test results, encounter notes, upcoming appointments, etc.  Non-urgent messages can be sent to your provider as well.   To learn more about what you can do with MyChart, go to [facility].    Your next appointment:   6 month(s)  The format for your next appointment:   In Person  Provider:   You may see [email or web address], MD or one of the following Advanced Practice Providers on your designated Care Team:     Lorine Bears, Marisue Ivan

## 2021-03-08 IMAGING — CT CT HEAD W/O CM
3 series · 16 of 47 positions shown, 19 images · non-contrast
Comparison: Head CT dated 05/19/2009.

CLINICAL DATA: 54-year-old male with headache.

EXAM:
CT HEAD WITHOUT CONTRAST
TECHNIQUE: Contiguous axial images were obtained from the base of the skull
through the vertex without intravenous contrast.

[Series 3: head wo · axial · 0.41mm/px · z∈[+84,+209]mm · 10 of 31 slices shown, 13 images]
[im 3/31  brain]
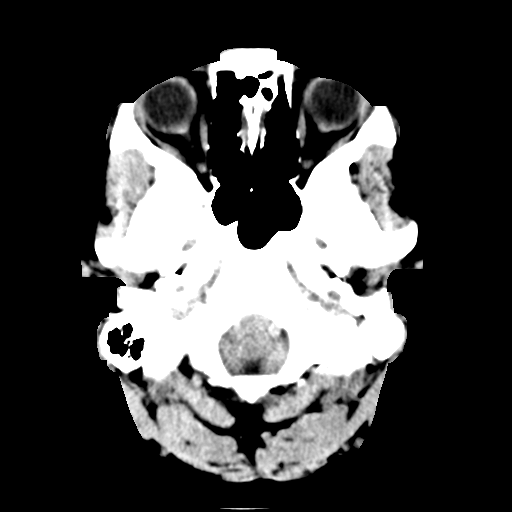
[im 3/31  bone]
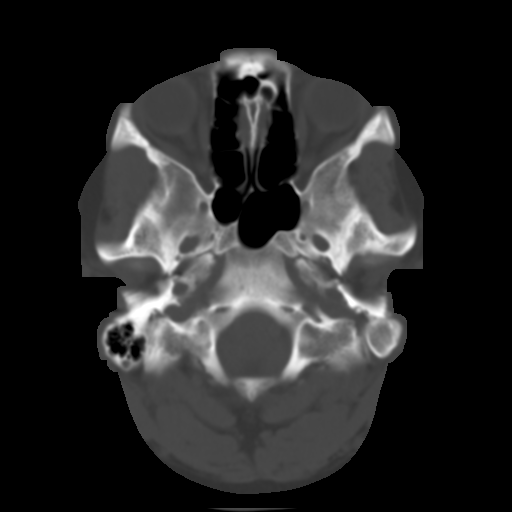
[im 6/31  brain]
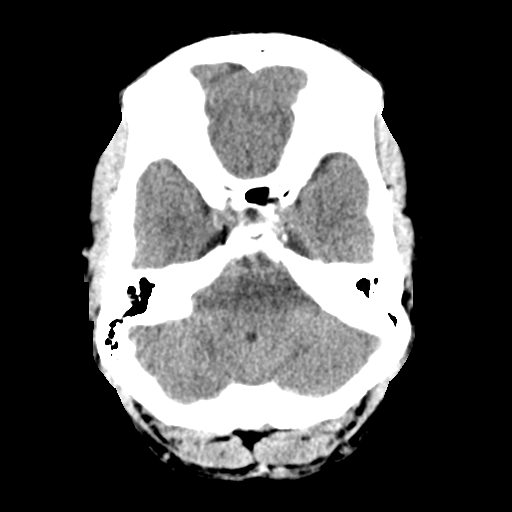
[im 9/31  brain]
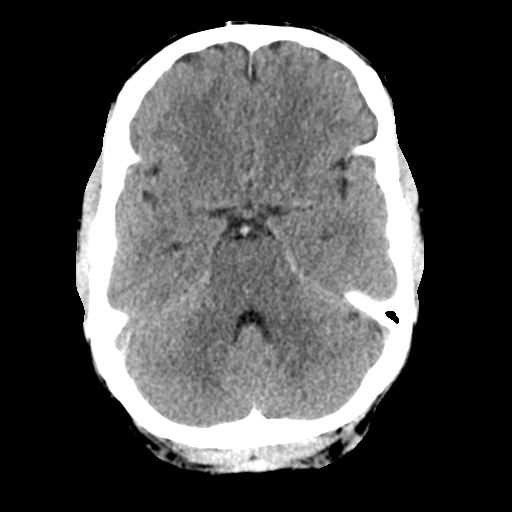
[im 11/31  brain]
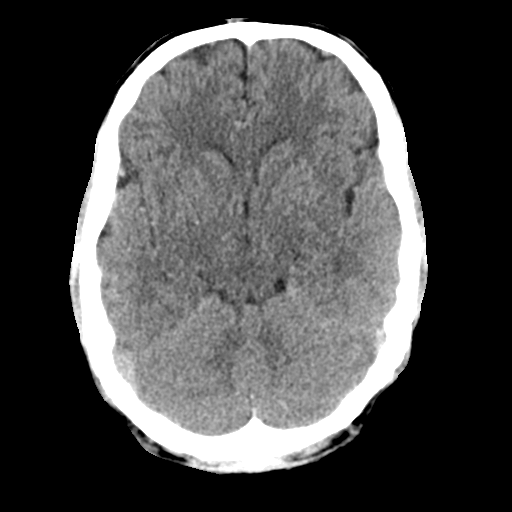
[im 14/31  brain]
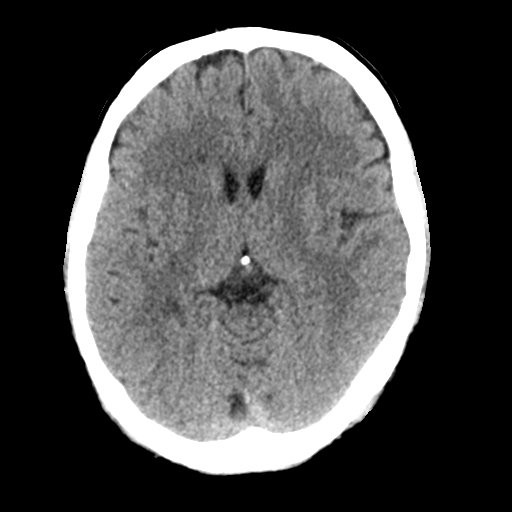
[im 14/31  bone]
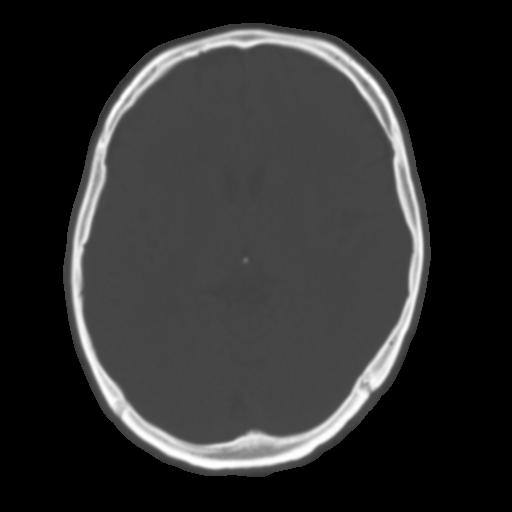
[im 17/31  brain]
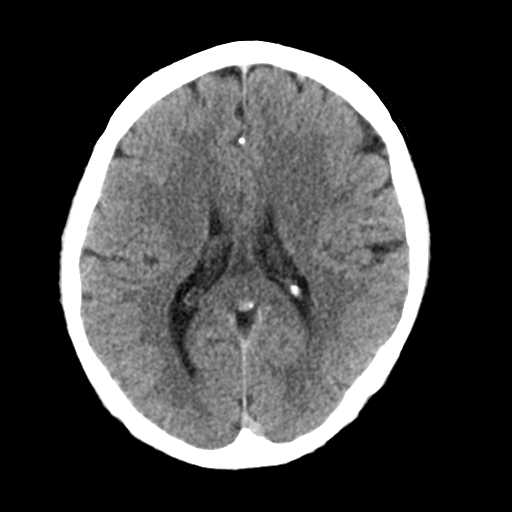
[im 20/31  brain]
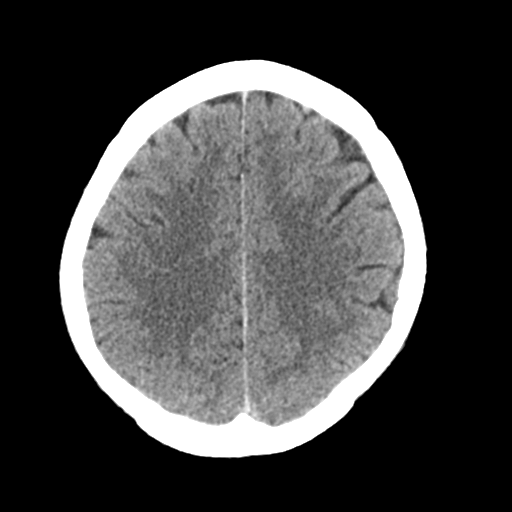
[im 23/31  brain]
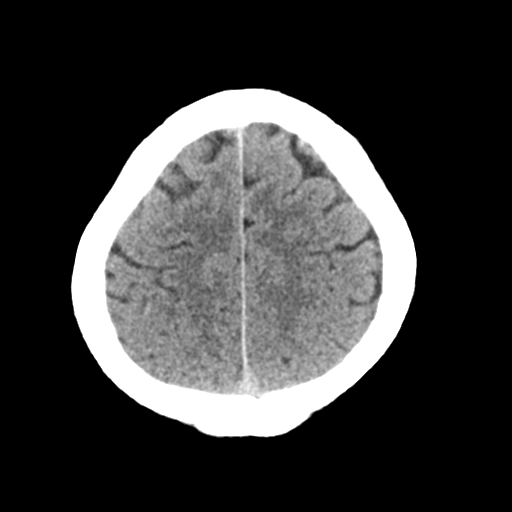
[im 25/31  brain]
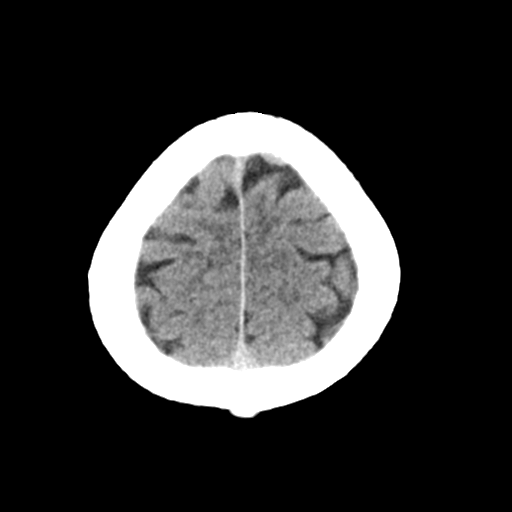
[im 25/31  bone]
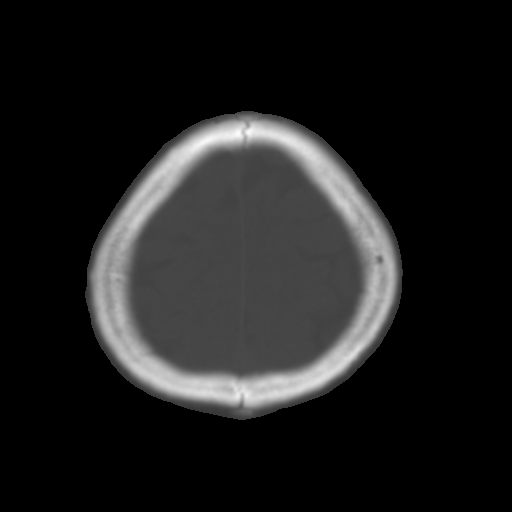
[im 28/31  brain]
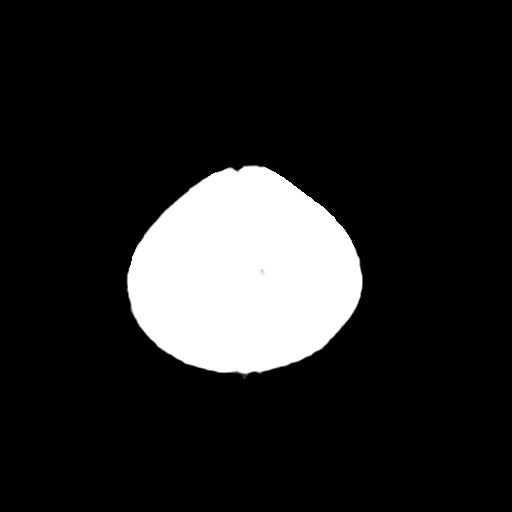

[Series 4: coronal soft tissue · coronal · 0.33mm/px · 3 of 65 slices shown]
[im 22/65  brain]
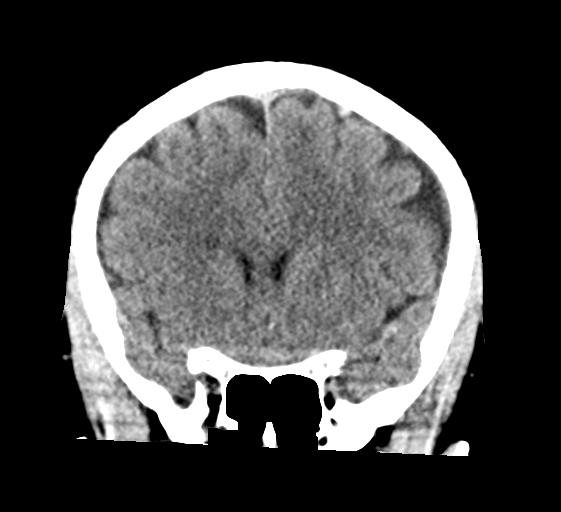
[im 29/65  brain]
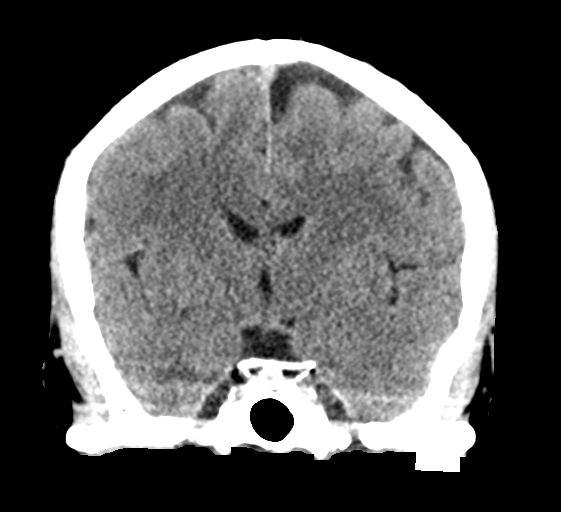
[im 36/65  brain]
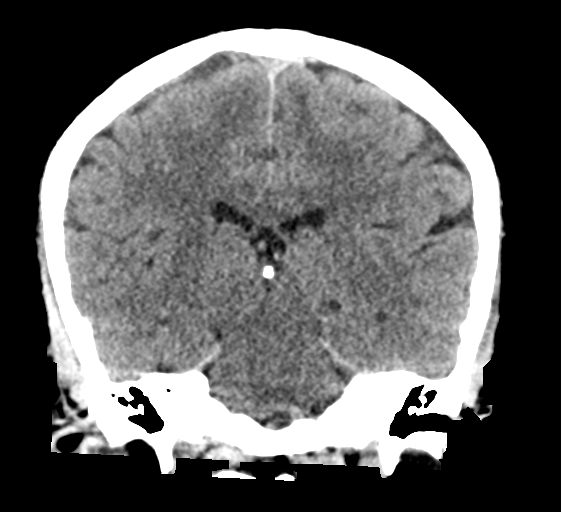

[Series 5: sagittal soft tissue · sagittal · 0.34mm/px · 3 of 56 slices shown]
[im 19/56  brain]
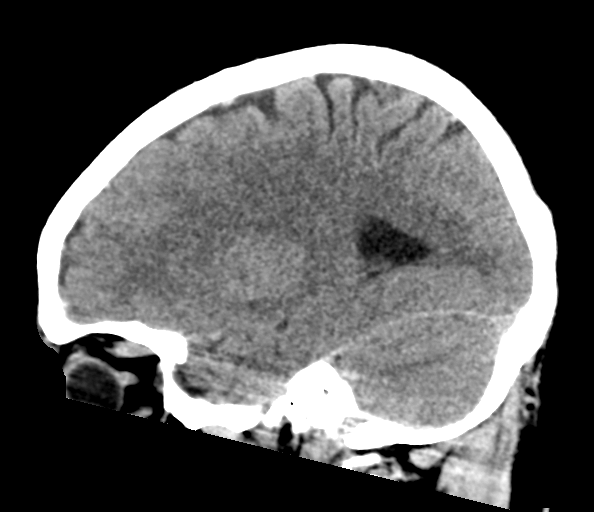
[im 28/56  brain]
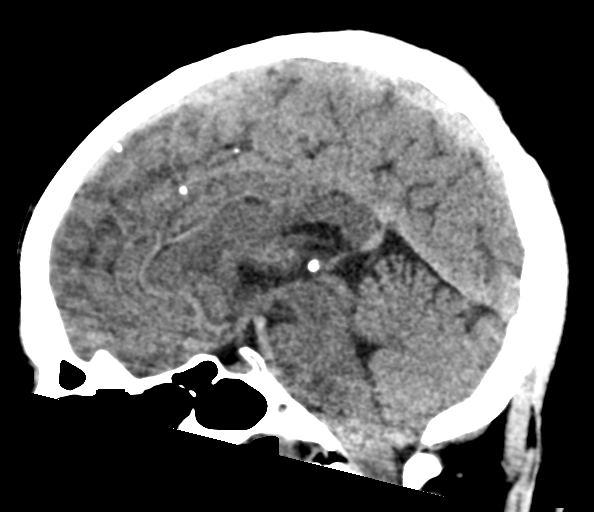
[im 37/56  brain]
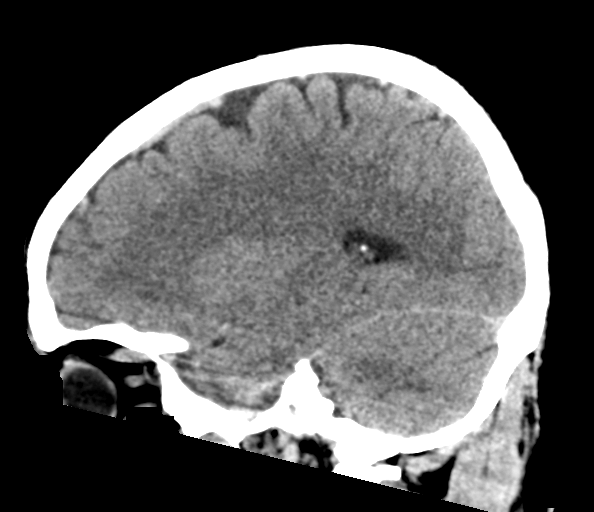

[16 of 47 positions shown; findings below may reference images not displayed]

FINDINGS: Brain: The ventricles and sulci appropriate size for patient's age.
The gray-white matter discrimination is preserved. There is no acute
intracranial hemorrhage. No mass effect or midline shift. No
extra-axial fluid collection.

Vascular: No hyperdense vessel or unexpected calcification.

Skull: Normal. Negative for fracture or focal lesion.

Sinuses/Orbits: No acute finding.

Other: None
IMPRESSION: Unremarkable noncontrast CT of the brain.

## 2021-04-07 ENCOUNTER — Other Ambulatory Visit: Payer: Self-pay

## 2021-04-07 ENCOUNTER — Encounter: Payer: Self-pay | Admitting: Medical

## 2021-04-07 ENCOUNTER — Ambulatory Visit (INDEPENDENT_AMBULATORY_CARE_PROVIDER_SITE_OTHER): Payer: Medicare Other | Admitting: Medical

## 2021-04-07 VITALS — BP 120/84 | HR 77 | Ht 63.0 in | Wt 189.0 lb

## 2021-04-07 DIAGNOSIS — I25118 Atherosclerotic heart disease of native coronary artery with other forms of angina pectoris: Secondary | ICD-10-CM | POA: Diagnosis not present

## 2021-04-07 DIAGNOSIS — Z87891 Personal history of nicotine dependence: Secondary | ICD-10-CM | POA: Diagnosis not present

## 2021-04-07 DIAGNOSIS — E785 Hyperlipidemia, unspecified: Secondary | ICD-10-CM | POA: Diagnosis not present

## 2021-04-07 DIAGNOSIS — I5032 Chronic diastolic (congestive) heart failure: Secondary | ICD-10-CM | POA: Diagnosis not present

## 2021-04-07 DIAGNOSIS — I2510 Atherosclerotic heart disease of native coronary artery without angina pectoris: Secondary | ICD-10-CM

## 2021-04-07 NOTE — Progress Notes (Signed)
Cardiology Office Note:    Date:  04/07/2021   ID:  [name], DOB 1965-06-02, MRN [id number]  PCP:  Patient, [name] (Inactive)  [facility] Cardiologist:  Lorine Bears, MD  [facility] Electrophysiologist:  None   Referring MD: No ref. provider found   Chief Complaint: 6 month follow-up  History of Present Illness:    Jeff Rios is a 55 y.o. male with a hx of history of CAD, NSTEMI 03/18/2019 s/p PCI/DES to RCA and LAD, HTN, HLD, tobacco use (quit July 2021), idiopathic angiedema, and who presents today for follow-up.  Previously, there was concern regarding a possible reaction to amlodipine, subsequently switched to metoprolol.  He has subsequently been tolerating amlodipine.     He was admitted 03/2019 with inferior ST elevation myocardial infarction.  He reported severe substernal chest discomfort that woke him from sleep.  Cardiac catheterization showed significant 2v CAD with an occluded distal RCA and significant mLAD stenosis.  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  He underwent successful PCI and DES to the distal RCA with staged LAD PCI.     He was last seen in clinic by his primary cardiologist, Dr. Kirke Corin, 02/01/2020.  He was doing well without CP or DOE.  He had run out of atorvastatin, carvedilol, and clopidogrel.  He continued to have difficulty obtaining his medications due to lack of transportation and cost of co-pays.  He had quit smoking 2 months prior. He later stopped Imdur for HA.      HE has NM study for SOB 05/2020 that was unrevealing and echo was stable. RVSP 28.6 with nl PASP, G1DD, mild MR, and mild LVH.   07/30/20 LHC performed with previously placed mLAD DES patent, rPDA 30%s, pRCA 30%s, non-stenotic dRCA previously treated and noted. No intervention performed with widely patent RCA and LAD stents without significant stenosis and no significant change since recent LHC. LV angiography not  performed and echo noted to have nl EF. LVEDP moderately elevated with SBP elevated during LHC. Recommendation was for BP control and medical management.    Last seen 08/12/20 and was doing well without further chest pain. Coreg was increased and Zetia was started.   Today, the patient reports occasional sharp chest pains. This started a couple days ago. Not worse with exertion. Feels that it's tightened. No shortness of breath, LLE, pnd, orthopnea. Pain is different than prior stenting. Patient does not have GERD. He took NTG for the pain, but it didn't help.    Past Medical History:  Diagnosis Date   CAD (coronary artery disease)    HLD (hyperlipidemia)    Hypertension     Past Surgical History:  Procedure Laterality Date   CARDIAC CATHETERIZATION     CORONARY STENT INTERVENTION N/A 03/18/2019   Procedure: CORONARY STENT INTERVENTION;  Surgeon: Iran Ouch, MD;  Location: [facility] INVASIVE CV LAB;  Service: Cardiovascular;  Laterality: N/A;   CORONARY STENT INTERVENTION N/A 03/20/2019   Procedure: CORONARY STENT INTERVENTION;  Surgeon: Iran Ouch, MD;  Location: [facility] INVASIVE CV [facility];  Service: Cardiovascular;  Laterality: N/A;   CORONARY/GRAFT ACUTE MI REVASCULARIZATION N/A 03/18/2019   Procedure: Coronary/Graft Acute MI Revascularization;  Surgeon: Iran Ouch, MD;  Location: [facility] INVASIVE CV LAB;  Service: Cardiovascular;  Laterality: N/A;   LEFT HEART CATH AND CORONARY ANGIOGRAPHY N/A 03/18/2019   Procedure: LEFT HEART CATH AND CORONARY ANGIOGRAPHY;  Surgeon: Iran Ouch, MD;  Location: [facility] INVASIVE CV LAB;  Service: Cardiovascular;  Laterality: N/A;   LEFT HEART CATH AND CORONARY ANGIOGRAPHY N/A 07/29/2020   Procedure: LEFT HEART CATH AND CORONARY ANGIOGRAPHY;  Surgeon: Iran Ouch, MD;  Location: [facility] INVASIVE CV LAB;  Service: Cardiovascular;  Laterality: N/A;    Current Medications: No outpatient medications have been marked as taking for the 04/07/21  encounter (Appointment) with Fransico Michael, Bing Duffey H, PA-C.   Current Facility-Administered Medications for the 04/07/21 encounter (Appointment) with Fransico Michael, Nikiya Starn H, PA-C  Medication   sodium chloride flush (NS) 0.9 % injection 3 mL     Allergies:   Amlodipine and Imdur [isosorbide nitrate]   Social History   Socioeconomic History   Marital status: Single    Spouse name: Not on file   Number of children: Not on file   Years of education: Not on file   Highest education level: Not on file  Occupational History   Not on file  Tobacco Use   Smoking status: Former    Packs/day: 1.00    Years: 30.00    Pack years: 30.00    Types: Cigarettes   Smokeless tobacco: Never   Tobacco comments:    ready to quit. Havent smoked in about 4 weeks.  Vaping Use   Vaping Use: Never used  Substance and Sexual Activity   Alcohol use: No   Drug use: No   Sexual activity: Not on file  Other Topics Concern   Not on file  Social History Narrative   Not on file   Social Determinants of Health   Financial Resource Strain: Not on file  Food Insecurity: Not on file  Transportation Needs: Not on file  Physical Activity: Not on file  Stress: Not on file  Social Connections: Not on file     Family History: The patient's Family history is unknown by patient.  ROS:   Please see the history of present illness.     All other systems reviewed and are negative.  EKGs/Labs/Other Studies Reviewed:    The following studies were reviewed today:  LHC 07/29/20 Previously placed Mid LAD drug eluting stent is widely patent. RPDA lesion is 30% stenosed. Prox RCA lesion is 30% stenosed. Non-stenotic Dist RCA lesion was previously treated. 1.  Widely patent RCA and LAD stents with no significant restenosis.  No significant change from most recent cardiac catheterization. 2.  Left ventricular angiography was not performed.  EF was normal by echo. 3.  Left ventricular end-diastolic pressure was moderately  elevated in the 20s range but his systemic blood pressure was elevated during cardiac catheterization. Recommendations: Continue medical therapy and blood pressure control. Coronary Diagrams     Diagnostic Dominance: Right       Echo 06/28/20  1. Left ventricular ejection fraction, by estimation, is 60 to 65%. The  left ventricle has normal function. The left ventricle has no regional  wall motion abnormalities. There is mild left ventricular hypertrophy.  Left ventricular diastolic parameters  are consistent with Grade I diastolic dysfunction (impaired relaxation).   2. Right ventricular systolic function is normal. The right ventricular  size is normal. There is normal pulmonary artery systolic pressure. The  estimated right ventricular systolic pressure is 28.6 mmHg.   3. The mitral valve is normal in structure. Mild mitral valve  regurgitation.    NM Study 07/03/20 There was no ST segment deviation noted during stress. No T wave inversion was noted during stress. The study is normal. This is a low risk study. The left ventricular ejection  fraction is hyperdynamic (>72%). There is no evidence for ischemia   PCI 03/20/2019 RPDA lesion is 40% stenosed. Prox RCA lesion is 30% stenosed. Previously placed Dist RCA drug eluting stent is widely patent. Balloon angioplasty was performed. Mid LAD lesion is 80% stenosed. Post intervention, there is a 0% residual stenosis. A drug-eluting stent was successfully placed using a STENT RESOLUTE ONYX 3.0X15. Successful angioplasty and drug-eluting stent placement to the mid LAD. Recommendations: Continue dual antiplatelet therapy for at least 1 year. Aggressive treatment of risk factors. Likely discharge home tomorrow. He is to stay off work for 2 weeks.     Echo 03/18/19  1. Left ventricular ejection fraction, by visual estimation, is 60 to  65%. The left ventricle has normal function. There is mildly increased  left ventricular  hypertrophy.   2. Left ventricular diastolic parameters are consistent with Grade I  diastolic dysfunction (impaired relaxation).   3. Global right ventricle has normal systolic function.The right  ventricular size is normal. No increase in right ventricular wall  thickness.   4. The mitral valve is normal in structure. No evidence of mitral valve  regurgitation. No evidence of mitral stenosis.   5. The tricuspid valve is normal in structure. Tricuspid valve  regurgitation is not demonstrated.   6. The aortic valve is normal in structure. Aortic valve regurgitation is  not visualized. No evidence of aortic valve sclerosis or stenosis.   7. The pulmonic valve was normal in structure. Pulmonic valve  regurgitation is trivial.   8. TR signal is inadequate for assessing pulmonary artery systolic  pressure.   9. The inferior vena cava is normal in size with greater than 50%  respiratory variability, suggesting right atrial pressure of 3 mmHg.    03/18/19 PCI The left ventricular systolic function is normal. LV end diastolic pressure is normal. The left ventricular ejection fraction is 55-65% by visual estimate. Mid LAD lesion is 80% stenosed. Prox RCA lesion is 30% stenosed. Dist RCA lesion is 100% stenosed. Post intervention, there is a 0% residual stenosis. A drug-eluting stent was successfully placed using a STENT RESOLUTE ONYX 2.25X22. RPDA lesion is 40% stenosed. 1.  Significant two-vessel coronary artery disease.  The culprit is an occluded distal right coronary artery with faint left-to-right collaterals.  The supplied area is not large and that is the likely reason for nondiagnostic EKG changes for inferior STEMI.  There is also significant mid LAD stenosis which appears to be hazy. 2.  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  Significantly elevated LVEDP in the  low 30s. 3.  Successful angioplasty and drug-eluting stent placement to the distal RCA. Recommendations: Continue dual antiplatelet therapy for at least 1 year.  Aggressive treatment of risk factors. Recommend staged PCI of the LAD before hospital discharge.  This is planned for Monday morning.  EKG:  EKG is  ordered today.  The ekg ordered today demonstrates NSR, 77bpm, TWI lateral leads  Recent Labs: 07/26/2020: BUN 13; Creatinine, Ser 0.88; Hemoglobin 14.2; Platelets 264; Potassium 4.0; Sodium 142  Recent Lipid Panel    Component Value Date/Time   CHOL 215 (H) 09/05/2019 1221   CHOL 200 (H) 06/07/2019 0922   TRIG 117 09/05/2019 1221   HDL 61 09/05/2019 1221   HDL 64 06/07/2019 0922   CHOLHDL 3.5 09/05/2019 1221   VLDL 23 09/05/2019 1221   LDLCALC 131 (H) 09/05/2019 1221   LDLCALC  122 (H) 06/07/2019 8115     Physical Exam:    VS:  There were no vitals taken for this visit.    Wt Readings from Last 3 Encounters:  08/12/20 185 lb (83.9 kg)  07/08/20 192 lb (87.1 kg)  05/17/20 184 lb (83.5 kg)     GEN:  Well nourished, well developed in no acute distress HEENT: Normal NECK: No JVD; No carotid bruits LYMPHATICS: No lymphadenopathy CARDIAC: RRR, no murmurs, rubs, gallops RESPIRATORY:  Clear to auscultation without rales, wheezing or rhonchi  ABDOMEN: Soft, non-tender, non-distended MUSCULOSKELETAL:  No edema; No deformity  SKIN: Warm and dry NEUROLOGIC:  Alert and oriented x 3 PSYCHIATRIC:  Normal affect   ASSESSMENT:    1. Coronary artery disease of native artery of native heart with stable angina pectoris (HCC)   2. Chronic diastolic heart failure (HCC)   3. Hyperlipidemia LDL goal <70   4. History of tobacco use   5. Coronary artery disease involving native coronary artery of native heart without angina pectoris    PLAN:    In order of problems listed above:  Atypical chest pain CAD s/p prior PCI Patient reports atypical chest pain. Not similar to prior  stenting. EKG with no new changes. Cath earlier this year showed widely patent RCA and LAD stents with no significant restenosis, no change from prior cath. EF was normal by echo. Patient intolerant to Imdur. Discussed Ranexa, but patient not wanting to try another medication. Continue Aspirin 81mg  daily, Pavix, Coreg, Zetia, Lipitor, amlodipine. We will revisit symptoms in 6-8 weeks.   HFpEF Patient is euvolemic on exam. Not on diuretic at baseline. Continue Coreg, lisinopril, amlodipine.   HTN BP good today. Continue current medications.   HLD LDL 131 09/2019. Check lipid profile today. Continue Lipitor and Zetia.  H/o tobacco use Ongoing cessation encouraged.    Disposition: Follow up in 6-8 week(s) with MD/APP    Signed, Michon Kaczmarek 10/2019, PA-C  04/07/2021 7:59 AM    [facility]

## 2021-04-07 NOTE — Patient Instructions (Signed)
Medication Instructions:  - Your physician recommends that you continue on your current medications as directed. Please refer to the Current Medication list given to you today.  *If you need a refill on your cardiac medications before your next appointment, please call your pharmacy*   Lab Work: - Your physician recommends that you have lipid profile: today   If you have labs (blood work) drawn today and your tests are completely normal, you will receive your results only by: MyChart Message (if you have MyChart) OR A paper copy in the mail If you have any lab test that is abnormal or we need to change your treatment, we will call you to review the results.   Testing/Procedures: - none ordered   Follow-Up: At [facility], you and your health needs are our priority.  As part of our continuing mission to provide you with exceptional heart care, we have created designated Provider Care Teams.  These Care Teams include your primary Cardiologist (physician) and Advanced Practice Providers (APPs -  Physician Assistants and Nurse Practitioners) who all work together to provide you with the care you need, when you need it.  We recommend signing up for the patient portal called "MyChart".  Sign up information is provided on this After Visit Summary.  MyChart is used to connect with patients for Virtual Visits (Telemedicine).  Patients are able to view lab/test results, encounter notes, upcoming appointments, etc.  Non-urgent messages can be sent to your provider as well.   To learn more about what you can do with MyChart, go to [email or web address].    Your next appointment:   6-8 week(s)  The format for your next appointment:   In Person  Provider:   You may see Lorine Bears, MD or one of the following Advanced Practice Providers on your designated Care Team:    Cadence Fransico Michael, [place]    Other Instructions N/a

## 2021-04-08 LAB — LIPID PANEL
Chol/HDL Ratio: 1.9 ratio (ref 0.0–5.0)
Cholesterol, Total: 99 mg/dL — ABNORMAL LOW (ref 100–199)
HDL: 51 mg/dL (ref >39)
LDL Chol Calc (NIH): 35 mg/dL (ref 0–99)
Triglycerides: 56 mg/dL (ref 0–149)
VLDL Cholesterol Cal: 13 mg/dL (ref 5–40)

## 2021-05-20 NOTE — Progress Notes (Deleted)
Cardiology Office Note:    Date:  05/20/2021   ID:  Jeff Rios, DOB [birth year], MRN [id number]  PCP:  Patient, [name] (Inactive)  [facility] Cardiologist:  Lorine BearsMuhammad Arida, MD  [facility] Electrophysiologist:  None   Referring MD: No ref. provider found   Chief Complaint: 6-8 week follow-up  History of Present Illness:    Jeff Rios is a 56 y.o. male with a hx of  hx of history of CAD, NSTEMI 03/18/2019 s/p PCI/DES to RCA and LAD, HTN, HLD, tobacco use (quit July 2021), idiopathic angiedema, and who presents today for follow-up.   Previously, there was concern regarding a possible reaction to amlodipine, subsequently switched to metoprolol.  He has subsequently been tolerating amlodipine.     He was admitted 03/2019 with inferior ST elevation myocardial infarction.  He reported severe substernal chest discomfort that woke him from sleep.  Cardiac catheterization showed significant 2v CAD with an occluded distal RCA and significant mLAD stenosis.  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  He underwent successful PCI and DES to the distal RCA with staged LAD PCI.     He was last seen in clinic by his primary cardiologist, Dr. Kirke CorinArida, 02/01/2020.  He was doing well without CP or DOE.  He had run out of atorvastatin, carvedilol, and clopidogrel.  He continued to have difficulty obtaining his medications due to lack of transportation and cost of co-pays.  He had quit smoking 2 months prior. He later stopped Imdur for HA.      HE has NM study for SOB 05/2020 that was unrevealing and echo was stable. RVSP 28.6 with nl PASP, G1DD, mild MR, and mild LVH.    07/30/20 LHC performed with previously placed mLAD DES patent, rPDA 30%s, pRCA 30%s, non-stenotic dRCA previously treated and noted. No intervention performed with widely patent RCA and LAD stents without significant stenosis and no significant change since recent LHC. LV  angiography not performed and echo noted to have nl EF. LVEDP moderately elevated with SBP elevated during LHC. Recommendation was for BP control and medical management.    Last seen 04/07/22 for atypical chest pain. Decided to reassess symptoms in 2 months.   Today,      Past Medical History:  Diagnosis Date   CAD (coronary artery disease)    HLD (hyperlipidemia)    Hypertension     Past Surgical History:  Procedure Laterality Date   CARDIAC CATHETERIZATION     CORONARY STENT INTERVENTION N/A 03/18/2019   Procedure: CORONARY STENT INTERVENTION;  Surgeon: Iran OuchArida, Muhammad A, MD;  Location: [facility] INVASIVE CV LAB;  Service: Cardiovascular;  Laterality: N/A;   CORONARY STENT INTERVENTION N/A 03/20/2019   Procedure: CORONARY STENT INTERVENTION;  Surgeon: Iran OuchArida, Muhammad A, MD;  Location: [facility] INVASIVE CV LAB;  Service: Cardiovascular;  Laterality: N/A;   CORONARY/GRAFT ACUTE MI REVASCULARIZATION N/A 03/18/2019   Procedure: Coronary/Graft Acute MI Revascularization;  Surgeon: Iran OuchArida, Muhammad A, MD;  Location: [facility] INVASIVE CV LAB;  Service: Cardiovascular;  Laterality: N/A;   LEFT HEART CATH AND CORONARY ANGIOGRAPHY N/A 03/18/2019   Procedure: LEFT HEART CATH AND CORONARY ANGIOGRAPHY;  Surgeon: Iran OuchArida, Muhammad A, MD;  Location: [facility] INVASIVE CV LAB;  Service: Cardiovascular;  Laterality: N/A;   LEFT HEART CATH AND CORONARY ANGIOGRAPHY N/A 07/29/2020   Procedure: LEFT HEART CATH AND CORONARY ANGIOGRAPHY;  Surgeon: Iran OuchArida, Muhammad A, MD;  Location: [facility] INVASIVE CV LAB;  Service: Cardiovascular;  Laterality: N/A;    Current  Medications: No outpatient medications have been marked as taking for the 05/23/21 encounter (Appointment) with Fransico Michael, Armya Westerhoff H, PA-C.     Allergies:   Amlodipine and Imdur [isosorbide nitrate]   Social History   Socioeconomic History   Marital status: Single    Spouse name: Not on file   Number of children: Not on file   Years of education: Not on file   Highest  education level: Not on file  Occupational History   Not on file  Tobacco Use   Smoking status: Former    Packs/day: 1.00    Years: 30.00    Pack years: 30.00    Types: Cigarettes   Smokeless tobacco: Never   Tobacco comments:    ready to quit. Havent smoked in about 4 weeks.  Vaping Use   Vaping Use: Never used  Substance and Sexual Activity   Alcohol use: No   Drug use: No   Sexual activity: Not on file  Other Topics Concern   Not on file  Social History Narrative   Not on file   Social Determinants of Health   Financial Resource Strain: Not on file  Food Insecurity: Not on file  Transportation Needs: Not on file  Physical Activity: Not on file  Stress: Not on file  Social Connections: Not on file     Family History: The patient's Family history is unknown by patient.  ROS:   Please see the history of present illness.     All other systems reviewed and are negative.  EKGs/Labs/Other Studies Reviewed:    The following studies were reviewed today:  LHC 07/29/20 Previously placed Mid LAD drug eluting stent is widely patent. RPDA lesion is 30% stenosed. Prox RCA lesion is 30% stenosed. Non-stenotic Dist RCA lesion was previously treated. 1.  Widely patent RCA and LAD stents with no significant restenosis.  No significant change from most recent cardiac catheterization. 2.  Left ventricular angiography was not performed.  EF was normal by echo. 3.  Left ventricular end-diastolic pressure was moderately elevated in the 20s range but his systemic blood pressure was elevated during cardiac catheterization. Recommendations: Continue medical therapy and blood pressure control. Coronary Diagrams     Diagnostic Dominance: Right       Echo 06/28/20  1. Left ventricular ejection fraction, by estimation, is 60 to 65%. The  left ventricle has normal function. The left ventricle has no regional  wall motion abnormalities. There is mild left ventricular hypertrophy.   Left ventricular diastolic parameters  are consistent with Grade I diastolic dysfunction (impaired relaxation).   2. Right ventricular systolic function is normal. The right ventricular  size is normal. There is normal pulmonary artery systolic pressure. The  estimated right ventricular systolic pressure is 28.6 mmHg.   3. The mitral valve is normal in structure. Mild mitral valve  regurgitation.    NM Study 07/03/20 There was no ST segment deviation noted during stress. No T wave inversion was noted during stress. The study is normal. This is a low risk study. The left ventricular ejection fraction is hyperdynamic (>72%). There is no evidence for ischemia   PCI 03/20/2019 RPDA lesion is 40% stenosed. Prox RCA lesion is 30% stenosed. Previously placed Dist RCA drug eluting stent is widely patent. Balloon angioplasty was performed. Mid LAD lesion is 80% stenosed. Post intervention, there is a 0% residual stenosis. A drug-eluting stent was successfully placed using a STENT RESOLUTE ONYX 3.0X15. Successful angioplasty and drug-eluting stent placement to the mid  LAD. Recommendations: Continue dual antiplatelet therapy for at least 1 year. Aggressive treatment of risk factors. Likely discharge home tomorrow. He is to stay off work for 2 weeks.     Echo 03/18/19  1. Left ventricular ejection fraction, by visual estimation, is 60 to  65%. The left ventricle has normal function. There is mildly increased  left ventricular hypertrophy.   2. Left ventricular diastolic parameters are consistent with Grade I  diastolic dysfunction (impaired relaxation).   3. Global right ventricle has normal systolic function.The right  ventricular size is normal. No increase in right ventricular wall  thickness.   4. The mitral valve is normal in structure. No evidence of mitral valve  regurgitation. No evidence of mitral stenosis.   5. The tricuspid valve is normal in structure. Tricuspid valve   regurgitation is not demonstrated.   6. The aortic valve is normal in structure. Aortic valve regurgitation is  not visualized. No evidence of aortic valve sclerosis or stenosis.   7. The pulmonic valve was normal in structure. Pulmonic valve  regurgitation is trivial.   8. TR signal is inadequate for assessing pulmonary artery systolic  pressure.   9. The inferior vena cava is normal in size with greater than 50%  respiratory variability, suggesting right atrial pressure of 3 mmHg.    03/18/19 PCI The left ventricular systolic function is normal. LV end diastolic pressure is normal. The left ventricular ejection fraction is 55-65% by visual estimate. Mid LAD lesion is 80% stenosed. Prox RCA lesion is 30% stenosed. Dist RCA lesion is 100% stenosed. Post intervention, there is a 0% residual stenosis. A drug-eluting stent was successfully placed using a STENT RESOLUTE ONYX 2.25X22. RPDA lesion is 40% stenosed. 1.  Significant two-vessel coronary artery disease.  The culprit is an occluded distal right coronary artery with faint left-to-right collaterals.  The supplied area is not large and that is the likely reason for nondiagnostic EKG changes for inferior STEMI.  There is also significant mid LAD stenosis which appears to be hazy. 2.  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  Significantly elevated LVEDP in the low 30s. 3.  Successful angioplasty and drug-eluting stent placement to the distal RCA. Recommendations: Continue dual antiplatelet therapy for at least 1 year.  Aggressive treatment of risk factors. Recommend staged PCI of the LAD before hospital discharge.  This is planned for Monday morning.  EKG:  EKG is *** ordered today.  The ekg ordered today demonstrates ***  Recent Labs: 07/26/2020: BUN 13; Creatinine, Ser 0.88; Hemoglobin 14.2; Platelets 264;  Potassium 4.0; Sodium 142  Recent Lipid Panel    Component Value Date/Time   CHOL 99 (L) 04/07/2021 1105   TRIG 56 04/07/2021 1105   HDL 51 04/07/2021 1105   CHOLHDL 1.9 04/07/2021 1105   CHOLHDL 3.5 09/05/2019 1221   VLDL 23 09/05/2019 1221   LDLCALC 35 04/07/2021 1105     Risk Assessment/Calculations:   {Does this patient have ATRIAL FIBRILLATION?:[id number]}   Physical Exam:    VS:  There were no vitals taken for this visit.    Wt Readings from Last 3 Encounters:  04/07/21 189 lb (85.7 kg)  08/12/20 185 lb (83.9 kg)  07/08/20 192 lb (87.1 kg)     GEN: *** Well nourished, well developed in no acute distress HEENT: Normal NECK: No JVD; No carotid bruits LYMPHATICS: No lymphadenopathy CARDIAC: ***RRR, no murmurs, rubs, gallops  RESPIRATORY:  Clear to auscultation without rales, wheezing or rhonchi  ABDOMEN: Soft, non-tender, non-distended MUSCULOSKELETAL:  No edema; No deformity  SKIN: Warm and dry NEUROLOGIC:  Alert and oriented x 3 PSYCHIATRIC:  Normal affect   ASSESSMENT:    No diagnosis found. PLAN:    In order of problems listed above:  Atypical chest pain  HFpEF  HTN  HLD  H/o tobacco use  Disposition: Follow up {follow up:15908} with ***   Shared Decision Making/Informed Consent   {Are you ordering a CV Procedure (e.g. stress test, cath, DCCV, TEE, etc)?   Press F2        :[id number]}    Signed, Shawnell Dykes Ardelle Lesches  05/20/2021 2:33 PM    [facility]

## 2021-05-23 ENCOUNTER — Ambulatory Visit: Payer: Medicare Other | Admitting: Medical

## 2021-05-26 ENCOUNTER — Encounter: Payer: Self-pay | Admitting: Medical

## 2021-07-21 ENCOUNTER — Telehealth: Payer: Self-pay

## 2021-07-21 NOTE — Telephone Encounter (Signed)
Patient is wanting to schedule a colonoscopy  ?

## 2021-07-22 ENCOUNTER — Telehealth: Payer: Self-pay | Admitting: Cardiovascular Disease

## 2021-07-22 ENCOUNTER — Other Ambulatory Visit: Payer: Self-pay

## 2021-07-22 DIAGNOSIS — Z1211 Encounter for screening for malignant neoplasm of colon: Secondary | ICD-10-CM

## 2021-07-22 MED ORDER — NA SULFATE-K SULFATE-MG SULF 17.5-3.13-1.6 GM/177ML PO SOLN: 1.0000 | Freq: Once | ORAL | 0 refills | Status: AC

## 2021-07-22 NOTE — Telephone Encounter (Signed)
Dr. Kirke Corin ?Pt with RCA and LAD stents patent by [facility] 07/2020. We are asked to hold plavix for colonoscopy.  ? ?OK to hold plavix? ?

## 2021-07-22 NOTE — Telephone Encounter (Signed)
? ?  Pre-operative Risk Assessment  ?  ?Patient Name: Jeff Rios  ?DOB: 1965/05/14 ?MRN: [id number]  ? ?  ? ?Request for Surgical Clearance   ? ?Procedure:   Colonoscopy  ? ?Date of Surgery:  Clearance 08/29/21                              ?   ?Surgeon:  not listed  ?Surgeon's Group or Practice Name:  [facility]  ?Phone number:  [phone] ?Fax number:  [phone] ?  ?Type of Clearance Requested:   ?- Pharmacy:  Hold Clopidogrel (Plavix) instructions ?  ?Type of Anesthesia:  Not Indicated ?  ?Additional requests/questions:   ? ?Signed, ?Morrie Sheldon Gerringer   ?07/22/2021, 1:20 PM  ? ?

## 2021-07-22 NOTE — Progress Notes (Signed)
Gastroenterology Pre-Procedure Review ? ?Request Date: 08/29/2021 ?Requesting Physician: Dr. Tobi Bastos ? ?PATIENT REVIEW QUESTIONS: The patient responded to the following health history questions as indicated:   ? ?1. Are you having any GI issues? no ?2. Do you have a personal history of Polyps? no ?3. Do you have a family history of Colon Cancer or Polyps? no ?4. Diabetes Mellitus? no ?5. Joint replacements in the past 12 months?no ?6. Major health problems in the past 3 months?no ?7. Any artificial heart valves, MVP, or defibrillator?no ?   ?MEDICATIONS & ALLERGIES:    ?Patient reports the following regarding taking any anticoagulation/antiplatelet therapy:   ?Plavix, Coumadin, Eliquis, Xarelto, Lovenox, Pradaxa, Brilinta, or Effient? yes (plavix) ?Aspirin? yes (81 mg) ? ?Patient confirms/reports the following medications:  ?Current Outpatient Medications  ?Medication Sig Dispense Refill  ? Acetaminophen 500 MG capsule SMARTSIG:2 Capsule(s) By Mouth 3 Times Daily PRN    ? amLODipine (NORVASC) 10 MG tablet Take 1 tablet (10 mg total) by mouth daily. 30 tablet 11  ? ASPIRIN LOW DOSE 81 MG EC tablet Take 81 mg by mouth daily.    ? atorvastatin (LIPITOR) 80 MG tablet Take 1 tablet (80 mg total) by mouth daily. 30 tablet 11  ? carvedilol (COREG) 6.25 MG tablet Take 1 tablet (6.25 mg total) by mouth 2 (two) times daily with a meal. 60 tablet 11  ? clopidogrel (PLAVIX) 75 MG tablet Take 1 tablet (75 mg total) by mouth daily. 30 tablet 11  ? ezetimibe (ZETIA) 10 MG tablet Take 1 tablet (10 mg total) by mouth daily. 30 tablet 11  ? lisinopril (ZESTRIL) 20 MG tablet Take 1 tablet (20 mg total) by mouth daily. 30 tablet 11  ? naproxen (NAPROSYN) 500 MG tablet Take 500 mg by mouth 3 (three) times daily.    ? nitroGLYCERIN (NITROSTAT) 0.4 MG SL tablet Place 1 tablet (0.4 mg total) under the tongue every 5 (five) minutes as needed for chest pain. 30 tablet 12  ? ?No current facility-administered medications for this visit.   ? ? ?Patient confirms/reports the following allergies:  ?Allergies  ?Allergen Reactions  ? Amlodipine Swelling  ?  Patient states he had an allergic reaction and was possibly from amlodipine. Taken off that and switched to metoprolol.   ? Imdur [Isosorbide Nitrate] Other (See Comments)  ?  Severe headache  ? ? ?No orders of the defined types were placed in this encounter. ? ? ?AUTHORIZATION INFORMATION ?Primary Insurance: ?1D#: ?Group #: ? ?Secondary Insurance: ?1D#: ?Group #: ? ?SCHEDULE INFORMATION: ?Date: 08/29/2021 ?Time: ?Location:[facility] ? ?

## 2021-07-23 NOTE — Telephone Encounter (Signed)
His PCI was in 2020.  Thus, he does not need to stay on clopidogrel.  We can go ahead and stop clopidogrel. ?

## 2021-07-24 ENCOUNTER — Telehealth: Payer: Self-pay | Admitting: *Deleted

## 2021-07-24 NOTE — Telephone Encounter (Signed)
MED REC AND CONSENT ARE DONE.  ? ?  ?Patient Consent for Virtual Visit  ? ? ?   ? ?Jeff Rios has provided verbal consent on 07/24/2021 for a virtual visit (video or telephone). ? ? ?CONSENT FOR VIRTUAL VISIT FOR:  Jeff Rios  ?By participating in this virtual visit I agree to the following: ? ?I hereby voluntarily request, consent and authorize [facility] and its employed or contracted physicians, physician assistants, nurse practitioners or other licensed health care professionals (the Practitioner), to provide me with telemedicine health care services (the ?Services") as deemed necessary by the treating Practitioner. I acknowledge and consent to receive the Services by the Practitioner via telemedicine. I understand that the telemedicine visit will involve communicating with the Practitioner through live audiovisual communication technology and the disclosure of certain medical information by electronic transmission. I acknowledge that I have been given the opportunity to request an in-person assessment or other available alternative prior to the telemedicine visit and am voluntarily participating in the telemedicine visit. ? ?I understand that I have the right to withhold or withdraw my consent to the use of telemedicine in the course of my care at any time, without affecting my right to future care or treatment, and that the Practitioner or I may terminate the telemedicine visit at any time. I understand that I have the right to inspect all information obtained and/or recorded in the course of the telemedicine visit and may receive copies of available information for a reasonable fee.  I understand that some of the potential risks of receiving the Services via telemedicine include:  ?Delay or interruption in medical evaluation due to technological equipment failure or disruption; ?Information transmitted may not be sufficient (e.g. poor resolution of images) to allow for appropriate medical  decision making by the Practitioner; and/or  ?In rare instances, security protocols could fail, causing a breach of personal health information. ? ?Furthermore, I acknowledge that it is my responsibility to provide information about my medical history, conditions and care that is complete and accurate to the best of my ability. I acknowledge that Practitioner's advice, recommendations, and/or decision may be based on factors not within their control, such as incomplete or inaccurate data provided by me or distortions of diagnostic images or specimens that may result from electronic transmissions. I understand that the practice of medicine is not an exact science and that Practitioner makes no warranties or guarantees regarding treatment outcomes. I acknowledge that a copy of this consent can be made available to me via my patient portal [facility] MyChart), or I can request a printed copy by calling the office of [facility].   ? ?I understand that my insurance will be billed for this visit.  ? ?I have read or had this consent read to me. ?I understand the contents of this consent, which adequately explains the benefits and risks of the Services being provided via telemedicine.  ?I have been provided ample opportunity to ask questions regarding this consent and the Services and have had my questions answered to my satisfaction. ?I give my informed consent for the services to be provided through the use of telemedicine in my medical care ? ? ? ?

## 2021-07-24 NOTE — Telephone Encounter (Signed)
S/w the pt today and he is agreeable to plan of care for tele pre op appt 07/28/21 @ 1 pm. Med rec and consent are done, see separate phone note ?

## 2021-07-24 NOTE — Telephone Encounter (Signed)
Preoperative team, please contact this patient and set up a phone call appointment for further cardiac evaluation.  Thank you for your help. ? ?Jossie Ng. Marua Qin NP-C ? ?  ?07/24/2021, 8:19 AM ?[place] [address] ?Office [phone] Fax [phone] ? ?

## 2021-07-28 ENCOUNTER — Encounter: Payer: Self-pay | Admitting: Physician Assistant

## 2021-07-28 ENCOUNTER — Ambulatory Visit (INDEPENDENT_AMBULATORY_CARE_PROVIDER_SITE_OTHER): Payer: Medicare Other | Admitting: Physician Assistant

## 2021-07-28 ENCOUNTER — Other Ambulatory Visit: Payer: Self-pay

## 2021-07-28 DIAGNOSIS — R0609 Other forms of dyspnea: Secondary | ICD-10-CM

## 2021-07-28 DIAGNOSIS — Z01810 Encounter for preprocedural cardiovascular examination: Secondary | ICD-10-CM

## 2021-07-28 NOTE — Telephone Encounter (Signed)
I s/w the pt today and he is agreeable to plan of care for IN OFFICE appt. Pt has been scheduled to see Cadence Fransico Michael, [facility] 08/07/21 @ 3:35 pm at the [facility] location. Pt agreeable to plan of care. Will forward notes to [facility] for upcoming appt. Will send FYI to requesting office the pt has appt 08/07/21. ? ?See notes from [facility], [facility] 07/28/21.  ?

## 2021-07-28 NOTE — Telephone Encounter (Signed)
Covering preop today. Virtual visit performed today. Patient needs in person OV due to increasing DOE for the last 1-2 months. I faxed copy of my virtual note today to requesting GI physician. Will route to callback team to arrange in-person visit for evaluation of DOE and pre-procedural clearance. I no-charged VV. Did not yet tell pt to stop Plavix given the change in symptoms - can be evaluated at f/u OV depending on workup. ?

## 2021-07-28 NOTE — Telephone Encounter (Signed)
I s/w the pt today and he is agreeable to plan of care for IN OFFICE appt. Pt has been scheduled to see Cadence Kathlen Mody, [facility] 08/07/21 @ 3:35 pm at the [facility] location. Pt agreeable to plan of care. Will forward notes to [facility] for upcoming appt. Will send FYI to requesting office the pt has appt 08/07/21. ?

## 2021-07-28 NOTE — Progress Notes (Addendum)
? ?Virtual Visit via Telephone Note  ? ?This visit type was conducted due to national recommendations for restrictions regarding the COVID-19 Pandemic (e.g. social distancing) in an effort to limit this patient's exposure and mitigate transmission in our community.  Due to his co-morbid illnesses, this patient is at least at moderate risk for complications without adequate follow up.  This format is felt to be most appropriate for this patient at this time.  The patient did not have access to video technology/had technical difficulties with video requiring transitioning to audio format only (telephone).  All issues noted in this document were discussed and addressed.  No physical exam could be performed with this format.  Please refer to the patient's chart for his  consent to telehealth for [facility]. ?Evaluation Performed:  Preoperative cardiovascular risk assessment ? ?This visit type was conducted due to national recommendations for restrictions regarding the COVID-19 Pandemic (e.g. social distancing).  This format is felt to be most appropriate for this patient at this time.  All issues noted in this document were discussed and addressed.  No physical exam was performed (except for noted visual exam findings with Video Visits).  Please refer to the patient's chart (MyChart message for video visits and phone note for telephone visits) for the patient's consent to telehealth for [facility]. ?_____________  ? ?Date:  07/28/2021  ? ?Patient ID:  Jeff RicksMickey Rios, DOB 1966/01/16, MRN [id number] ?Patient Location:  ?Home ?Provider location:   ?Office ? ?Primary Care Provider:  Patient, [name] (Inactive) ?Primary Cardiologist:  Lorine BearsMuhammad Arida, MD ? ?Chief Complaint  ?  ?56 y.o. y/o male with a h/o CAD (NSTEMI 03/2019 s/p DES to RCA and LAD), HTN, HLD, former tobacco abuse, idiopathic angioedema, who is pending colonoscopy, and presents today for telephonic preoperative cardiovascular risk  assessment. ? ?Past Medical History  ?  ?Past Medical History:  ?Diagnosis Date  ? CAD (coronary artery disease)   ? HLD (hyperlipidemia)   ? Hypertension   ? ?Past Surgical History:  ?Procedure Laterality Date  ? CARDIAC CATHETERIZATION    ? CORONARY STENT INTERVENTION N/A 03/18/2019  ? Procedure: CORONARY STENT INTERVENTION;  Surgeon: Iran OuchArida, Muhammad A, MD;  Location: [facility] INVASIVE CV [facility];  Service: Cardiovascular;  Laterality: N/A;  ? CORONARY STENT INTERVENTION N/A 03/20/2019  ? Procedure: CORONARY STENT INTERVENTION;  Surgeon: Iran OuchArida, Muhammad A, MD;  Location: [facility];  Service: Cardiovascular;  Laterality: N/A;  ? CORONARY/GRAFT ACUTE MI REVASCULARIZATION N/A 03/18/2019  ? Procedure: Coronary/Graft Acute MI Revascularization;  Surgeon: Iran OuchArida, Muhammad A, MD;  Location: [facility];  Service: Cardiovascular;  Laterality: N/A;  ? LEFT HEART CATH AND CORONARY ANGIOGRAPHY N/A 03/18/2019  ? Procedure: LEFT HEART CATH AND CORONARY ANGIOGRAPHY;  Surgeon: Iran OuchArida, Muhammad A, MD;  Location: [facility];  Service: Cardiovascular;  Laterality: N/A;  ? LEFT HEART CATH AND CORONARY ANGIOGRAPHY N/A 07/29/2020  ? Procedure: LEFT HEART CATH AND CORONARY ANGIOGRAPHY;  Surgeon: Iran OuchArida, Muhammad A, MD;  Location: [facility];  Service: Cardiovascular;  Laterality: N/A;  ? ? ?Allergies ? ?Allergies  ?Allergen Reactions  ? Amlodipine Swelling  ?  Patient states he had an allergic reaction and was possibly from amlodipine. Taken off that and switched to metoprolol.   ? Imdur [Isosorbide Nitrate] Other (See Comments)  ?  Severe headache  ? ? ?History of Present Illness  ?  ?Jeff RicksMickey Theilen is a 56 y.o. male who presents via Web designeraudio/video conferencing for a telehealth visit  today. History reviewed above. In addition to history above, at time of cath 03/2019 there was spade like appearance and mid cavity gradient suggestive of a variant form of hypertrophic cardiomyopathy. However, this was not  subsequently commented upon in follow-up echo and cath. Last cath 07/2020 showed patent stents. Pt was last seen in cardiology clinic on 04/07/21, by Cadence Furth PA-C.  At that time Lamontae Ricardo was doing well though had some residual atypical chest pain, dissimilar to prior angina.  He is now pending colonoscopy and we are asked to hold Plavix.  Since his last visit, he reports increasing DOE over the past 1-2 months. He walks to work and he has noticed he has to stop 3-4 times to catch his breath. He is concerned and wants to get his heart checked out. He has not had any further chest pain. Denies cough, hemoptysis, signs of GI bleeding, presyncope, syncope, dizziness, orthopnea, edema, palpitations, weight gain or weight loss. He feels well today. He reports compliance with all meds and clarifies he is not taking naproxen. ? ?Home Medications  ?  ?Prior to Admission medications   ?Medication Sig Start Date End Date Taking? Authorizing Provider  ?Acetaminophen 500 MG capsule SMARTSIG:2 Capsule(s) By Mouth 3 Times Daily PRN 11/07/19   [provider]  ?amLODipine (NORVASC) 10 MG tablet Take 1 tablet (10 mg total) by mouth daily. 07/26/20 07/24/21  Marisue Ivan D, PA-C  ?ASPIRIN LOW DOSE 81 MG EC tablet Take 81 mg by mouth daily. 02/25/21   [provider]  ?atorvastatin (LIPITOR) 80 MG tablet Take 1 tablet (80 mg total) by mouth daily. 07/26/20 07/24/21  Marisue Ivan D, PA-C  ?carvedilol (COREG) 6.25 MG tablet Take 1 tablet (6.25 mg total) by mouth 2 (two) times daily with a meal. 08/12/20 08/07/21  Marisue Ivan D, PA-C  ?clopidogrel (PLAVIX) 75 MG tablet Take 1 tablet (75 mg total) by mouth daily. 07/26/20   Marisue Ivan D, PA-C  ?ezetimibe (ZETIA) 10 MG tablet Take 1 tablet (10 mg total) by mouth daily. 08/12/20 08/07/21  Marisue Ivan D, PA-C  ?lisinopril (ZESTRIL) 20 MG tablet Take 1 tablet (20 mg total) by mouth daily. 07/26/20 07/24/21  Marisue Ivan D, PA-C  ?nitroGLYCERIN  (NITROSTAT) 0.4 MG SL tablet Place 1 tablet (0.4 mg total) under the tongue every 5 (five) minutes as needed for chest pain. 03/21/19 09/18/28  Marzetta Board, MD  ? ? ?Physical Exam  ?  ?Vital Signs:  Chevez Sambrano does not have vital signs available for review today. ? ?Given telephonic nature of communication, physical exam is limited. ?AAOx3. NAD. Normal affect.  Speech and respirations are unlabored. ? ?Accessory Clinical Findings  ?  ?None ? ?Assessment & Plan  ?  ?1.  Preoperative Cardiovascular Risk Assessment: Per telephone discussion today, the patient would like to be seen for increasing DOE over the last 1-2 months. Therefore, we cannot clear him for this procedure today. He will need to be seen in clinic first. I will hold off on discontinuing his Plavix as per 3/21 phone note until he is evaluated in clinic for formal pre-procedure evaluation. We will no-charge this phone visit and arrange next available in-office visit. Also discussed ER precautions with patient. He is agreeable with plan. Will cc copy of this note to requesting physician. ? ? ?Time:   ?Today, I have spent 8 minutes with the patient with telehealth technology discussing medical history, symptoms, and management plan.   ? ? ?Laurann Montana, PA-C ? ?  07/28/2021, 12:56 PM ? ?

## 2021-07-29 ENCOUNTER — Telehealth: Payer: Self-pay

## 2021-07-29 NOTE — Telephone Encounter (Signed)
Called patient to let him know he was not clear to have procedure so called endo and patient  ?

## 2021-08-07 ENCOUNTER — Ambulatory Visit: Payer: Medicare Other | Admitting: Medical

## 2021-08-07 NOTE — Progress Notes (Deleted)
?Cardiology Office Note:   ? ?Date:  08/07/2021  ? ?ID:  Jeff Rios, DOB [birth year], MRN [id number] ? ?PCP:  Patient, [name] (Inactive)  ?[facility] Cardiologist:  Lorine Bears, MD  ?[facility] Electrophysiologist:  None  ? ?Referring MD: No ref. provider found  ? ?Chief Complaint: pre-op clearance ? ?History of Present Illness:   ? ?Jeff Rios is a 56 y.o. male with a hx of CAD (NSTEMI 03/2019 s/p DES to RCA and LAD), HTN, HLD, former tobacco abuse, idiopathic angioedema, who is pending colonoscopy, and presents today for telephonic preoperative cardiovascular risk assessment.  ? ?. History reviewed above. In addition to history above, at time of cath 03/2019 there was spade like appearance and mid cavity gradient suggestive of a variant form of hypertrophic cardiomyopathy. However, this was not subsequently commented upon in follow-up echo and cath. Last cath 07/2020 showed patent stents. ? ? ?He was seen on televisit for per-op clearance 07/28/21 and reported progressive DOE for 2 months. It was agreed he needed in office visit.  ? ?Today,  ?Past Medical History:  ?Diagnosis Date  ? CAD (coronary artery disease)   ? HLD (hyperlipidemia)   ? Hypertension   ? ? ?Past Surgical History:  ?Procedure Laterality Date  ? CARDIAC CATHETERIZATION    ? CORONARY STENT INTERVENTION N/A 03/18/2019  ? Procedure: CORONARY STENT INTERVENTION;  Surgeon: Iran Ouch, MD;  Location: [facility];  Service: Cardiovascular;  Laterality: N/A;  ? CORONARY STENT INTERVENTION N/A 03/20/2019  ? Procedure: CORONARY STENT INTERVENTION;  Surgeon: Iran Ouch, MD;  Location: [facility];  Service: Cardiovascular;  Laterality: N/A;  ? CORONARY/GRAFT ACUTE MI REVASCULARIZATION N/A 03/18/2019  ? Procedure: Coronary/Graft Acute MI Revascularization;  Surgeon: Iran Ouch, MD;  Location: [facility];  Service: Cardiovascular;  Laterality: N/A;  ? LEFT HEART CATH AND CORONARY  ANGIOGRAPHY N/A 03/18/2019  ? Procedure: LEFT HEART CATH AND CORONARY ANGIOGRAPHY;  Surgeon: Iran Ouch, MD;  Location: [facility];  Service: Cardiovascular;  Laterality: N/A;  ? LEFT HEART CATH AND CORONARY ANGIOGRAPHY N/A 07/29/2020  ? Procedure: LEFT HEART CATH AND CORONARY ANGIOGRAPHY;  Surgeon: Iran Ouch, MD;  Location: [facility];  Service: Cardiovascular;  Laterality: N/A;  ? ? ?Current Medications: ?No outpatient medications have been marked as taking for the 08/07/21 encounter (Appointment) with Fransico Michael, Zahara Rembert H, PA-C.  ?  ? ?Allergies:   Amlodipine and Imdur [isosorbide nitrate]  ? ?Social History  ? ?Socioeconomic History  ? Marital status: Single  ?  Spouse name: Not on file  ? Number of children: Not on file  ? Years of education: Not on file  ? Highest education level: Not on file  ?Occupational History  ? Not on file  ?Tobacco Use  ? Smoking status: Former  ?  Packs/day: 1.00  ?  Years: 30.00  ?  Pack years: 30.00  ?  Types: Cigarettes  ? Smokeless tobacco: Never  ? Tobacco comments:  ?  ready to quit. Havent smoked in about 4 weeks.  ?Vaping Use  ? Vaping Use: Never used  ?Substance and Sexual Activity  ? Alcohol use: No  ? Drug use: No  ? Sexual activity: Not on file  ?Other Topics Concern  ? Not on file  ?Social History Narrative  ? Not on file  ? ?Social Determinants of Health  ? ?Financial Resource Strain: Not on file  ?Food Insecurity: Not on file  ?  Transportation Needs: Not on file  ?Physical Activity: Not on file  ?Stress: Not on file  ?Social Connections: Not on file  ?  ? ?Family History: ?The patient's Family history is unknown by patient. ? ?ROS:   ?Please see the history of present illness.    ? All other systems reviewed and are negative. ? ?EKGs/Labs/Other Studies Reviewed:   ? ?The following studies were reviewed today: ? ?Cardiac cath 07/29/20 ?  ?Previously placed Mid LAD drug eluting stent is widely patent. ?RPDA lesion is 30% stenosed. ?Prox RCA  lesion is 30% stenosed. ?Non-stenotic Dist RCA lesion was previously treated. ?  ?1.  Widely patent RCA and LAD stents with no significant restenosis.  No significant change from most recent cardiac catheterization. ?2.  Left ventricular angiography was not performed.  EF was normal by echo. ?3.  Left ventricular end-diastolic pressure was moderately elevated in the 20s range but his systemic blood pressure was elevated during cardiac catheterization. ?  ?Recommendations: ?Continue medical therapy and blood pressure control. ?  ? ?Echo 06/28/20 ? 1. Left ventricular ejection fraction, by estimation, is 60 to 65%. The  ?left ventricle has normal function. The left ventricle has no regional  ?wall motion abnormalities. There is mild left ventricular hypertrophy.  ?Left ventricular diastolic parameters  ?are consistent with Grade I diastolic dysfunction (impaired relaxation).  ? 2. Right ventricular systolic function is normal. The right ventricular  ?size is normal. There is normal pulmonary artery systolic pressure. The  ?estimated right ventricular systolic pressure is 28.6 mmHg.  ? 3. The mitral valve is normal in structure. Mild mitral valve  ?regurgitation.  ? ?EKG:  EKG is *** ordered today.  The ekg ordered today demonstrates *** ? ?Recent Labs: ?No results found for requested labs within last 8760 hours.  ?Recent Lipid Panel ?   ?Component Value Date/Time  ? CHOL 99 (L) 04/07/2021 1105  ? TRIG 56 04/07/2021 1105  ? HDL 51 04/07/2021 1105  ? CHOLHDL 1.9 04/07/2021 1105  ? CHOLHDL 3.5 09/05/2019 1221  ? VLDL 23 09/05/2019 1221  ? LDLCALC 35 04/07/2021 1105  ? ? ? ?Risk Assessment/Calculations:   ?{Does this patient have ATRIAL FIBRILLATION?[phone]} ? ? ?Physical Exam:   ? ?VS:  There were no vitals taken for this visit.   ? ?Wt Readings from Last 3 Encounters:  ?04/07/21 189 lb (85.7 kg)  ?08/12/20 185 lb (83.9 kg)  ?07/08/20 192 lb (87.1 kg)  ?  ? ?GEN: *** Well nourished, well developed in no acute  distress ?HEENT: Normal ?NECK: No JVD; No carotid bruits ?LYMPHATICS: No lymphadenopathy ?CARDIAC: ***RRR, no murmurs, rubs, gallops ?RESPIRATORY:  Clear to auscultation without rales, wheezing or rhonchi  ?ABDOMEN: Soft, non-tender, non-distended ?MUSCULOSKELETAL:  No edema; No deformity  ?SKIN: Warm and dry ?NEUROLOGIC:  Alert and oriented x 3 ?PSYCHIATRIC:  Normal affect  ? ?ASSESSMENT:   ? ?No diagnosis found. ?PLAN:   ? ?In order of problems listed above: ? ?*** ? ?Disposition: Follow up {follow up:15908} with ***  ? ?Shared Decision Making/Informed Consent   ?{Are you ordering a CV Procedure (e.g. stress test, cath, DCCV, TEE, etc)?   Press F2        :[id number]}  ? ? ?Signed, ?Alfie Alderfer David Stall, PA-C  ?08/07/2021 8:02 AM    ?[facility]  ?

## 2021-08-25 ENCOUNTER — Other Ambulatory Visit: Payer: Self-pay

## 2021-08-25 DIAGNOSIS — I2510 Atherosclerotic heart disease of native coronary artery without angina pectoris: Secondary | ICD-10-CM

## 2021-08-25 DIAGNOSIS — I10 Essential (primary) hypertension: Secondary | ICD-10-CM

## 2021-08-25 MED ORDER — AMLODIPINE BESYLATE 10 MG PO TABS: 10.0000 mg | ORAL_TABLET | Freq: Every day | ORAL | 0 refills | Status: DC

## 2021-08-25 MED ORDER — ATORVASTATIN CALCIUM 80 MG PO TABS: 80.0000 mg | ORAL_TABLET | Freq: Every day | ORAL | 0 refills | Status: DC

## 2021-08-25 MED ORDER — LISINOPRIL 20 MG PO TABS: 20.0000 mg | ORAL_TABLET | Freq: Every day | ORAL | 0 refills | Status: DC

## 2021-08-25 MED ORDER — EZETIMIBE 10 MG PO TABS: 10.0000 mg | ORAL_TABLET | Freq: Every day | ORAL | 0 refills | Status: DC

## 2021-08-25 MED ORDER — CLOPIDOGREL BISULFATE 75 MG PO TABS: 75.0000 mg | ORAL_TABLET | Freq: Every day | ORAL | 0 refills | Status: DC

## 2021-08-25 MED ORDER — CARVEDILOL 6.25 MG PO TABS: 6.2500 mg | ORAL_TABLET | Freq: Two times a day (BID) | ORAL | 0 refills | Status: DC

## 2021-08-29 ENCOUNTER — Ambulatory Visit: Admit: 2021-08-29 | Payer: Medicare Other | Admitting: Gastroenterology

## 2021-08-29 IMAGING — US US EXTREM LOW VENOUS*R*
1 series · 14 of 24 positions shown · non-contrast
Comparison: None.

CLINICAL DATA: Right lower extremity pain and swelling. History of
a fall. Color changes and ulcerations.

EXAM:
Right LOWER EXTREMITY VENOUS DOPPLER ULTRASOUND
TECHNIQUE: Gray-scale sonography with compression, as well as color and duplex
ultrasound, were performed to evaluate the deep venous system(s)
from the level of the common femoral vein through the popliteal and
proximal calf veins.

[Series 1: us venous img lower uni right (dvt) · portal-venous · 14 of 37 slices shown]
[im 1/37]
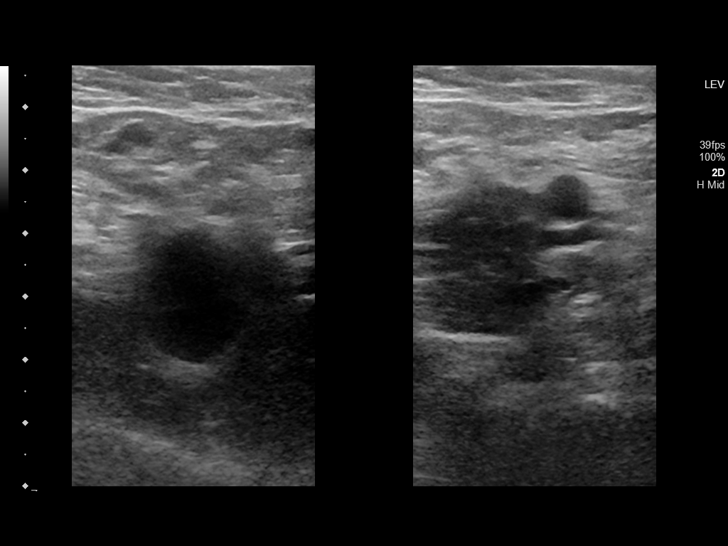
[im 4/37]
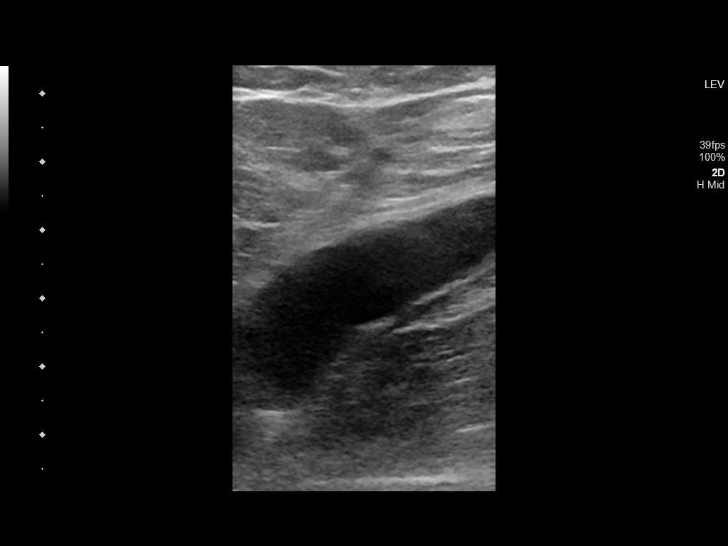
[im 7/37]
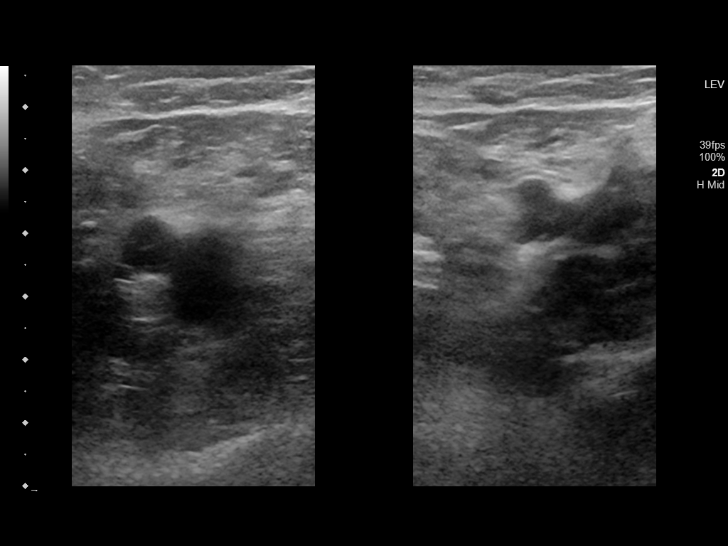
[im 10/37]
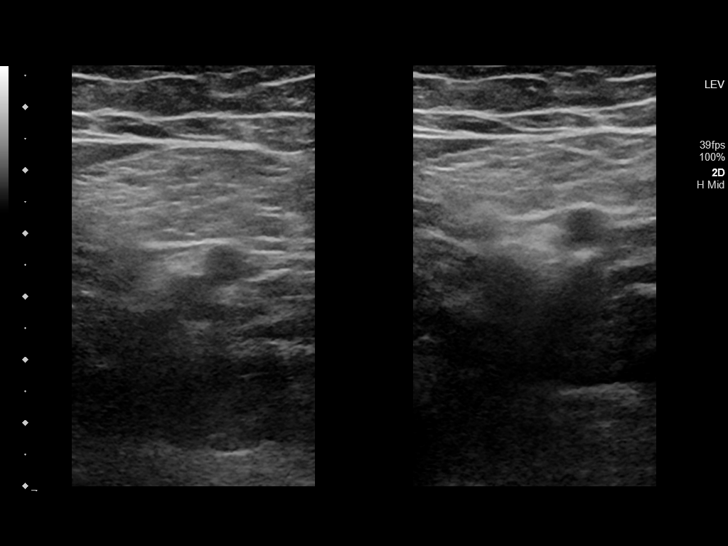
[im 11/37]
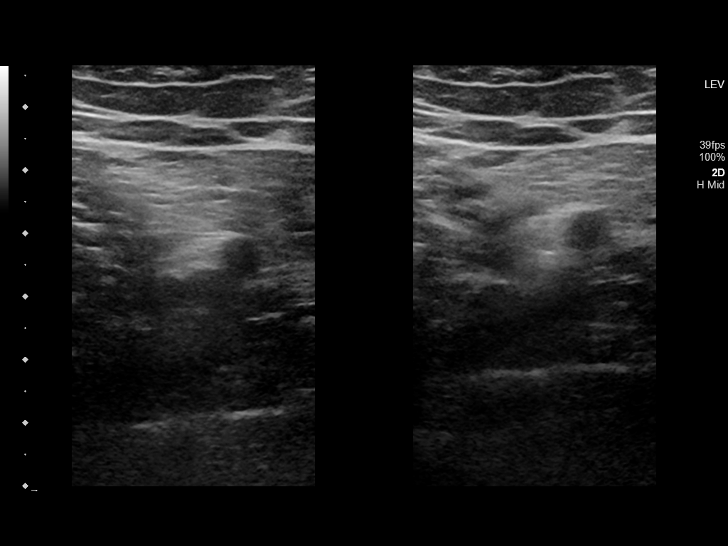
[im 15/37]
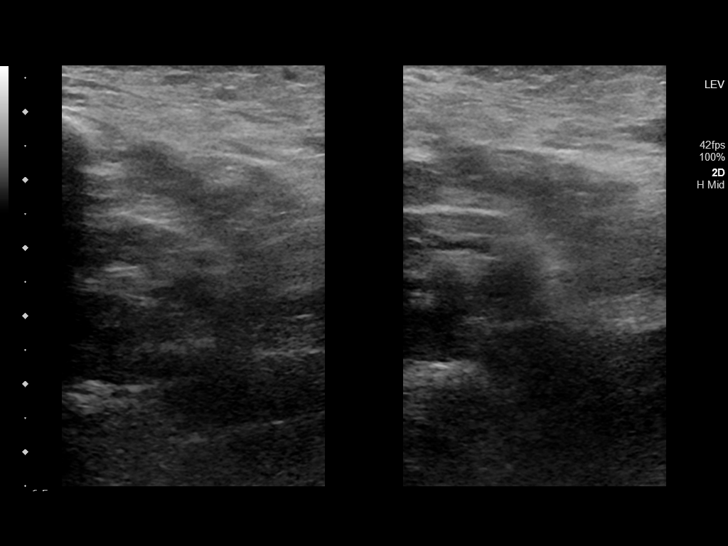
[im 18/37]
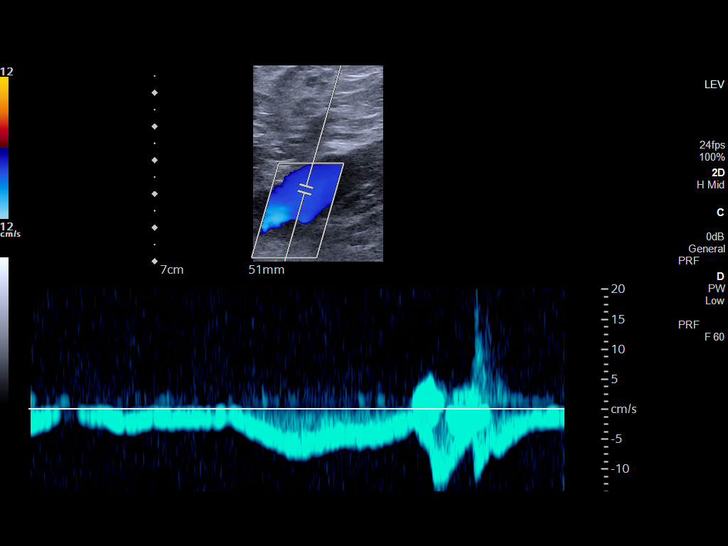
[im 19/37]
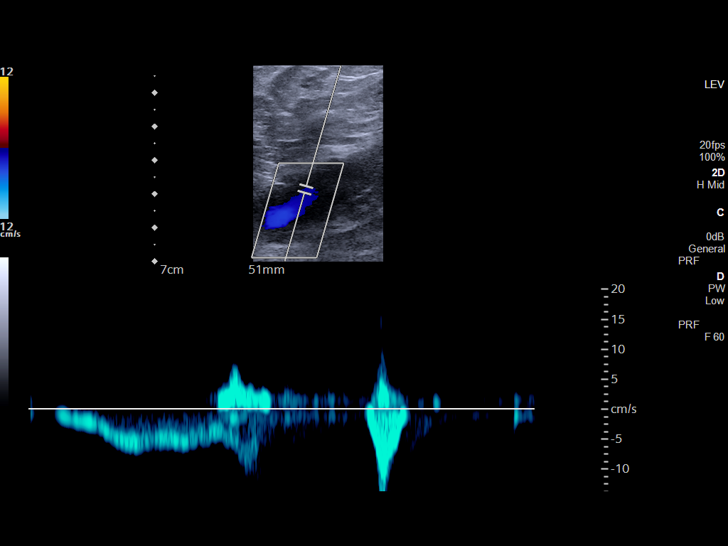
[im 22/37]
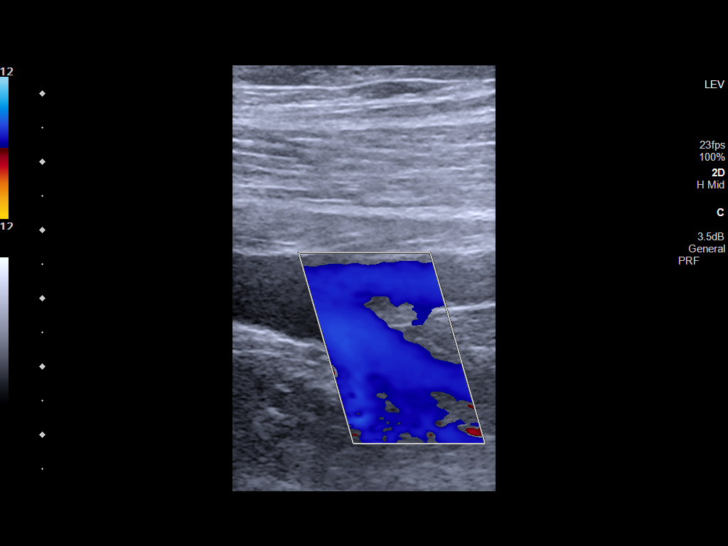
[im 26/37]
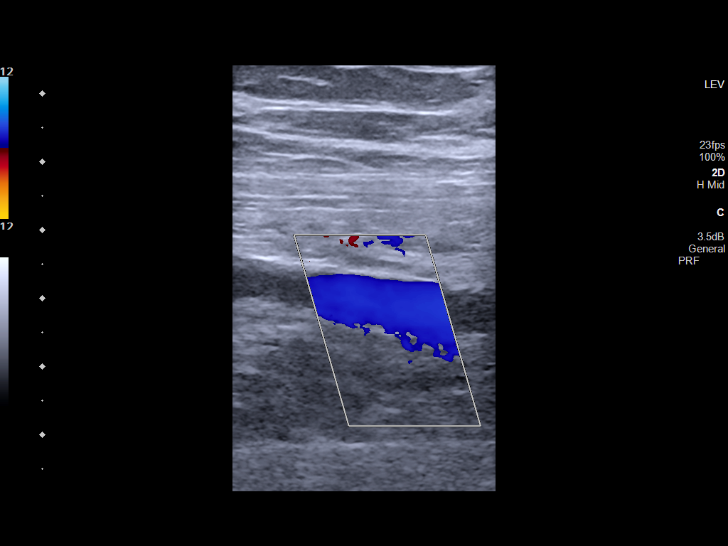
[im 29/37]
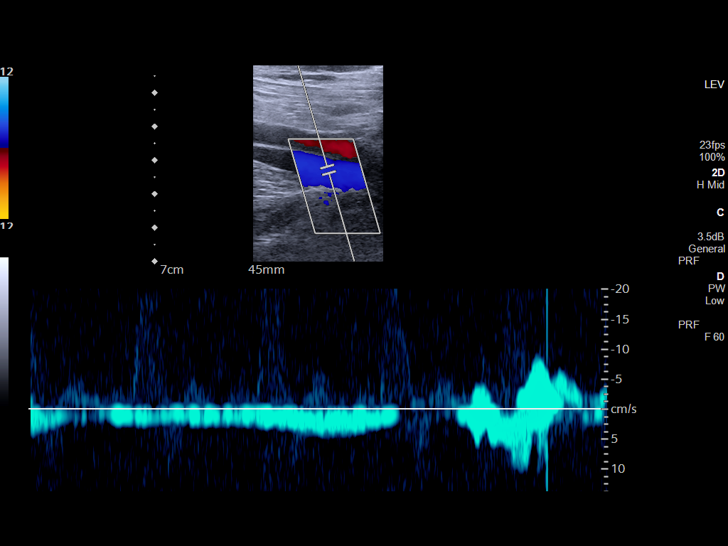
[im 30/37]
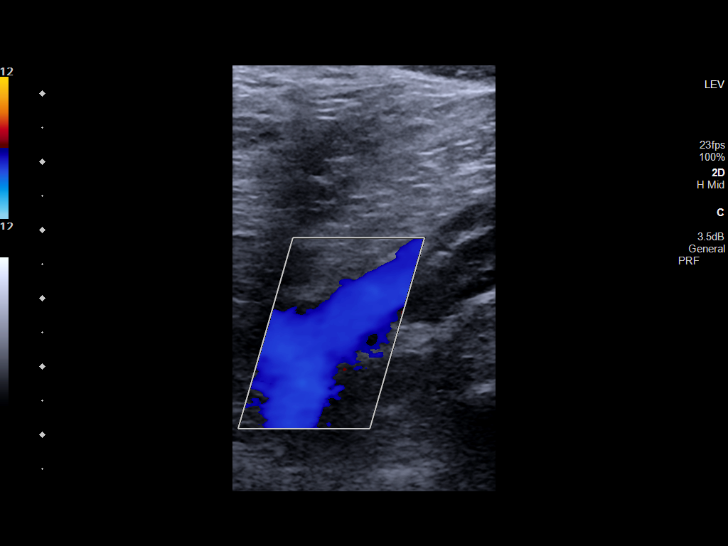
[im 33/37]
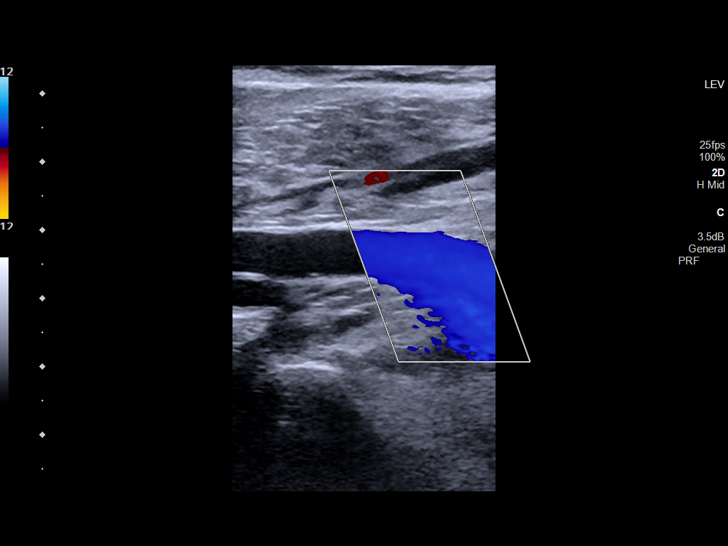
[im 37/37]
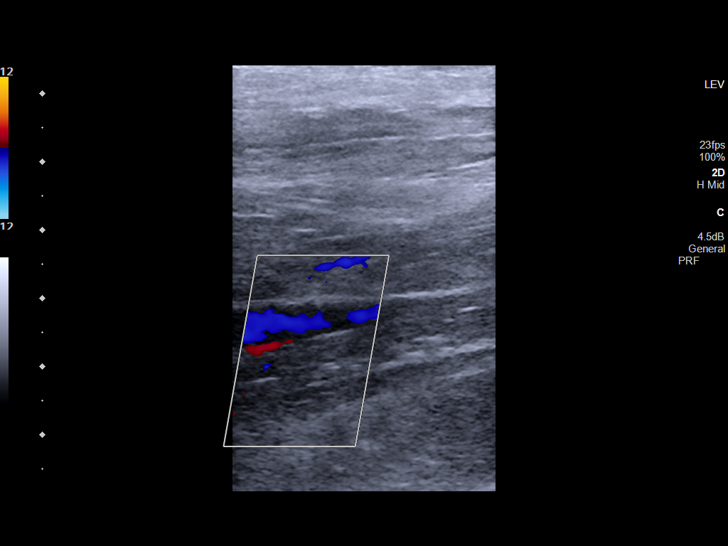

[14 of 24 positions shown; findings below may reference images not displayed]

FINDINGS: VENOUS

Normal compressibility of the common femoral, superficial femoral,
and popliteal veins, as well as the visualized calf veins.
Visualized portions of profunda femoral vein and great saphenous
vein unremarkable. No filling defects to suggest DVT on grayscale or
color Doppler imaging. Doppler waveforms show normal direction of
venous flow, normal respiratory plasticity and response to
augmentation.

Limited views of the contralateral common femoral vein are
unremarkable.

OTHER

None.

Limitations: none
IMPRESSION: No evidence of acute deep venous thrombosis demonstrated in the
visualized lower extremity.

## 2021-08-29 SURGERY — COLONOSCOPY WITH PROPOFOL
Anesthesia: General

## 2021-09-04 ENCOUNTER — Ambulatory Visit (INDEPENDENT_AMBULATORY_CARE_PROVIDER_SITE_OTHER): Payer: Medicare Other | Admitting: Medical

## 2021-09-04 ENCOUNTER — Encounter: Payer: Self-pay | Admitting: Medical

## 2021-09-04 ENCOUNTER — Telehealth: Payer: Self-pay | Admitting: Medical

## 2021-09-04 VITALS — BP 110/70 | HR 69 | Ht 63.0 in | Wt 186.4 lb

## 2021-09-04 DIAGNOSIS — R0609 Other forms of dyspnea: Secondary | ICD-10-CM | POA: Diagnosis not present

## 2021-09-04 DIAGNOSIS — I2510 Atherosclerotic heart disease of native coronary artery without angina pectoris: Secondary | ICD-10-CM

## 2021-09-04 DIAGNOSIS — I5032 Chronic diastolic (congestive) heart failure: Secondary | ICD-10-CM

## 2021-09-04 DIAGNOSIS — I10 Essential (primary) hypertension: Secondary | ICD-10-CM | POA: Diagnosis not present

## 2021-09-04 DIAGNOSIS — Z01810 Encounter for preprocedural cardiovascular examination: Secondary | ICD-10-CM

## 2021-09-04 NOTE — Progress Notes (Signed)
?Cardiology Office Note:   ? ?Date:  09/09/2021  ? ?ID:  Jeff Rios, DOB Nov 30, 1965, MRN [id number] ? ?PCP:  Patient, [name] (Inactive)  ?[facility] Cardiologist:  Lorine BearsMuhammad Arida, MD  ?[facility] Electrophysiologist:  None  ? ?Referring MD: No ref. provider found  ? ?Chief Complaint: pre-op evaluation ? ?History of Present Illness:   ? ?Jeff RicksMickey Rios is a 56 y.o. male with a hx of with a h/o CAD (NSTEMI 03/2019 s/p DES to RCA and LAD), HTN, HLD, former tobacco abuse, idiopathic angioedema, who is pending colonoscopy, and presents today for telephonic preoperative cardiovascular risk assessment. ? ?He was admitted 03/2019 with inferior ST elevation myocardial infarction.  He reported severe substernal chest discomfort that woke him from sleep.  Cardiac catheterization showed significant 2v CAD with an occluded distal RCA and significant mLAD stenosis.  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  He underwent successful PCI and DES to the distal RCA with staged LAD PCI.   ?  ?He was last seen in clinic by his primary cardiologist, Dr. Kirke CorinArida, 02/01/2020.  He was doing well without CP or DOE.  He had run out of atorvastatin, carvedilol, and clopidogrel.  He continued to have difficulty obtaining his medications due to lack of transportation and cost of co-pays.  He had quit smoking 2 months prior. He later stopped Imdur for HA.    ?  ?HE has NM study for SOB 05/2020 that was unrevealing and echo was stable. RVSP 28.6 with nl PASP, G1DD, mild MR, and mild LVH.  ?  ?07/30/20 LHC performed with previously placed mLAD DES patent, rPDA 30%s, pRCA 30%s, non-stenotic dRCA previously treated and noted. No intervention performed with widely patent RCA and LAD stents without significant stenosis and no significant change since recent LHC. LV angiography not performed and echo noted to have nl EF. LVEDP moderately elevated with SBP elevated during LHC. Recommendation was  for BP control and medical management.  ? ?Seen in virtual visit 07/28/21 for pre-opcolonoscopy and are requesting to hold Plavix prior to the procedure. The patient reported increasing DOE and was set up for in-office visit.  ? ?Today, the patient reports he has DOE for the last few months. It seems worse when he starts walking. He walks to work and has to stop 3-4 times. No LLE, orthopnea, pnd. No chest pain. No palpitations, fever, or chills. He reports compliance with medications.  ? ?Past Medical History:  ?Diagnosis Date  ? CAD (coronary artery disease)   ? HLD (hyperlipidemia)   ? Hypertension   ? ? ?Past Surgical History:  ?Procedure Laterality Date  ? CARDIAC CATHETERIZATION    ? CORONARY STENT INTERVENTION N/A 03/18/2019  ? Procedure: CORONARY STENT INTERVENTION;  Surgeon: Iran OuchArida, Muhammad A, MD;  Location: [facility];  Service: Cardiovascular;  Laterality: N/A;  ? CORONARY STENT INTERVENTION N/A 03/20/2019  ? Procedure: CORONARY STENT INTERVENTION;  Surgeon: Iran OuchArida, Muhammad A, MD;  Location: [facility];  Service: Cardiovascular;  Laterality: N/A;  ? CORONARY/GRAFT ACUTE MI REVASCULARIZATION N/A 03/18/2019  ? Procedure: Coronary/Graft Acute MI Revascularization;  Surgeon: Iran OuchArida, Muhammad A, MD;  Location: [facility];  Service: Cardiovascular;  Laterality: N/A;  ? LEFT HEART CATH AND CORONARY ANGIOGRAPHY N/A 03/18/2019  ? Procedure: LEFT HEART CATH AND CORONARY ANGIOGRAPHY;  Surgeon: Iran OuchArida, Muhammad A, MD;  Location: [facility];  Service: Cardiovascular;  Laterality: N/A;  ? LEFT HEART CATH AND CORONARY ANGIOGRAPHY N/A  07/29/2020  ? Procedure: LEFT HEART CATH AND CORONARY ANGIOGRAPHY;  Surgeon: Iran Ouch, MD;  Location: [facility];  Service: Cardiovascular;  Laterality: N/A;  ? ? ?Current Medications: ?Current Meds  ?Medication Sig  ? Acetaminophen 500 MG capsule SMARTSIG:2 Capsule(s) By Mouth 3 Times Daily PRN  ? amLODipine (NORVASC) 10 MG tablet Take  1 tablet (10 mg total) by mouth daily.  ? ASPIRIN LOW DOSE 81 MG EC tablet Take 81 mg by mouth daily.  ? atorvastatin (LIPITOR) 80 MG tablet Take 1 tablet (80 mg total) by mouth daily.  ? carvedilol (COREG) 6.25 MG tablet Take 1 tablet (6.25 mg total) by mouth 2 (two) times daily with a meal.  ? clopidogrel (PLAVIX) 75 MG tablet Take 1 tablet (75 mg total) by mouth daily. Further refills to be authorized at appointment. Thank you!  ? ezetimibe (ZETIA) 10 MG tablet Take 1 tablet (10 mg total) by mouth daily.  ? lisinopril (ZESTRIL) 20 MG tablet Take 1 tablet (20 mg total) by mouth daily.  ? nitroGLYCERIN (NITROSTAT) 0.4 MG SL tablet Place 1 tablet (0.4 mg total) under the tongue every 5 (five) minutes as needed for chest pain.  ?  ? ?Allergies:   Amlodipine and Imdur [isosorbide nitrate]  ? ?Social History  ? ?Socioeconomic History  ? Marital status: Single  ?  Spouse name: Not on file  ? Number of children: Not on file  ? Years of education: Not on file  ? Highest education level: Not on file  ?Occupational History  ? Not on file  ?Tobacco Use  ? Smoking status: Former  ?  Packs/day: 1.00  ?  Years: 30.00  ?  Pack years: 30.00  ?  Types: Cigarettes  ? Smokeless tobacco: Never  ?Vaping Use  ? Vaping Use: Never used  ?Substance and Sexual Activity  ? Alcohol use: No  ? Drug use: No  ? Sexual activity: Not on file  ?Other Topics Concern  ? Not on file  ?Social History Narrative  ? Not on file  ? ?Social Determinants of Health  ? ?Financial Resource Strain: Not on file  ?Food Insecurity: Not on file  ?Transportation Needs: No Transportation Needs  ? Lack of Transportation (Medical): No  ? Lack of Transportation (Non-Medical): No  ?Physical Activity: Not on file  ?Stress: Not on file  ?Social Connections: Not on file  ?  ? ?Family History: ?The patient's Family history is unknown by patient. ? ?ROS:   ?Please see the history of present illness.    ? All other systems reviewed and are negative. ? ?EKGs/Labs/Other Studies  Reviewed:   ? ?The following studies were reviewed today: ? ?  ?LHC ?07/29/20 ?Previously placed Mid LAD drug eluting stent is widely patent. ?RPDA lesion is 30% stenosed. ?Prox RCA lesion is 30% stenosed. ?Non-stenotic Dist RCA lesion was previously treated. ?1.  Widely patent RCA and LAD stents with no significant restenosis.  No significant change from most recent cardiac catheterization. ?2.  Left ventricular angiography was not performed.  EF was normal by echo. ?3.  Left ventricular end-diastolic pressure was moderately elevated in the 20s range but his systemic blood pressure was elevated during cardiac catheterization. ?Recommendations: ?Continue medical therapy and blood pressure control. ?Coronary Diagrams ?  ?  ?Diagnostic ?Dominance: Right ?  ?  ?  ?Echo ?06/28/20 ? 1. Left ventricular ejection fraction, by estimation, is 60 to 65%. The  ?left ventricle has normal function. The left ventricle has no  regional  ?wall motion abnormalities. There is mild left ventricular hypertrophy.  ?Left ventricular diastolic parameters  ?are consistent with Grade I diastolic dysfunction (impaired relaxation).  ? 2. Right ventricular systolic function is normal. The right ventricular  ?size is normal. There is normal pulmonary artery systolic pressure. The  ?estimated right ventricular systolic pressure is 28.6 mmHg.  ? 3. The mitral valve is normal in structure. Mild mitral valve  ?regurgitation.  ?  ?NM Study ?07/03/20 ?There was no ST segment deviation noted during stress. ?No T wave inversion was noted during stress. ?The study is normal. ?This is a low risk study. ?The left ventricular ejection fraction is hyperdynamic (>72%). ?There is no evidence for ischemia ?  ?PCI ?03/20/2019 ?RPDA lesion is 40% stenosed. ?Prox RCA lesion is 30% stenosed. ?Previously placed Dist RCA drug eluting stent is widely patent. ?Balloon angioplasty was performed. ?Mid LAD lesion is 80% stenosed. ?Post intervention, there is a 0% residual  stenosis. ?A drug-eluting stent was successfully placed using a STENT RESOLUTE ONYX 3.0X15. ?Successful angioplasty and drug-eluting stent placement to the mid LAD. ?Recommendations: ?Continue dual antip

## 2021-09-04 NOTE — Patient Instructions (Signed)
Medication Instructions:  ?Your physician recommends that you continue on your current medications as directed. Please refer to the Current Medication list given to you today. ? ?*If you need a refill on your cardiac medications before your next appointment, please call your pharmacy* ? ? ?Lab Work: ?None ordered ?If you have labs (blood work) drawn today and your tests are completely normal, you will receive your results only by: ?MyChart Message (if you have MyChart) OR ?A paper copy in the mail ?If you have any lab test that is abnormal or we need to change your treatment, we will call you to review the results. ? ? ?Testing/Procedures: ?Your physician has requested that you have an echocardiogram. Echocardiography is a painless test that uses sound waves to create images of your heart. It provides your doctor with information about the size and shape of your heart and how well your heart?s chambers and valves are working. This procedure takes approximately one hour. There are no restrictions for this procedure. ? ? ? ?Follow-Up: ?At [facility], you and your health needs are our priority.  As part of our continuing mission to provide you with exceptional heart care, we have created designated Provider Care Teams.  These Care Teams include your primary Cardiologist (physician) and Advanced Practice Providers (APPs -  Physician Assistants and Nurse Practitioners) who all work together to provide you with the care you need, when you need it. ? ?We recommend signing up for the patient portal called "MyChart".  Sign up information is provided on this After Visit Summary.  MyChart is used to connect with patients for Virtual Visits (Telemedicine).  Patients are able to view lab/test results, encounter notes, upcoming appointments, etc.  Non-urgent messages can be sent to your provider as well.   ?To learn more about what you can do with MyChart, go to [email or web address].   ? ?Your next appointment:   ?After  the echo ? ?The format for your next appointment:   ?In Person ? ?Provider:   ?You may see Lorine Bears, MD or one of the following Advanced Practice Providers on your designated Care Team:   ?Nicolasa Ducking, NP ?Eula Listen, PA-C ?Cadence Fransico Michael, PA-C ? ? ?Other Instructions ?N/A ? ?Important Information About Sugar ? ? ? ? ? ? ?

## 2021-09-04 NOTE — Telephone Encounter (Signed)
Patient Consent for Virtual Visit    { PLEASE READ THE FOLLOWING TO THE PATIENT. Then, scroll to the question in red below. If the patient has questions about consent, refer to and read the consent below,    CONSENT FOR VIRTUAL VISIT.  Mr. Gilliam, you are scheduled for a virtual visit with your provider today.  Just as we do with appointments in the office, we must obtain your consent to participate.  Your consent will be active for this visit and any virtual visit you may have with one of our providers in the next 365 days.  If you have a MyChart account, I can also send a copy of this consent to you electronically.  All virtual visits are billed to your insurance company just like a traditional visit in the office.  As this is a virtual visit, video technology does not allow for your provider to perform a traditional examination.  This may limit your provider's ability to fully assess your condition.  If your provider identifies any concerns that need to be evaluated in person or the need to arrange testing such as labs, EKG, etc, we will make arrangements to do so.  Although advances in technology are sophisticated, we cannot ensure that it will always work on either your end or our end.  If the connection with a video visit is poor, we may have to switch to a telephone visit.  With either a video or telephone visit, we are not always able to ensure that we have a secure connection.   I need to obtain your verbal consent now.   Are you willing to proceed with your visit today?        YES  Did the patient verbally consent to the visit?  Place your cursor on the next line. Then, press F2 or Fn plus F2 to select the list below.  Then, choose YES or NO.  [name] has provided verbal consent on 09/04/2021 for a virtual visit (video or telephone).   CONSENT FOR VIRTUAL VISIT FOR:  Jeff Rios  By participating in this virtual visit I agree to the following:  I hereby voluntarily  request, consent and authorize [facility] and its employed or contracted physicians, physician assistants, nurse practitioners or other licensed health care professionals (the Practitioner), to provide me with telemedicine health care services (the "Services") as deemed necessary by the treating Practitioner. I acknowledge and consent to receive the Services by the Practitioner via telemedicine. I understand that the telemedicine visit will involve communicating with the Practitioner through live audiovisual communication technology and the disclosure of certain medical information by electronic transmission. I acknowledge that I have been given the opportunity to request an in-person assessment or other available alternative prior to the telemedicine visit and am voluntarily participating in the telemedicine visit.  I understand that I have the right to withhold or withdraw my consent to the use of telemedicine in the course of my care at any time, without affecting my right to future care or treatment, and that the Practitioner or I may terminate the telemedicine visit at any time. I understand that I have the right to inspect all information obtained and/or recorded in the course of the telemedicine visit and may receive copies of available information for a reasonable fee.  I understand that some of the potential risks of receiving the Services via telemedicine include:  Delay or interruption in medical evaluation due to technological equipment failure or disruption; Information transmitted may  not be sufficient (e.g. poor resolution of images) to allow for appropriate medical decision making by the Practitioner; and/or  In rare instances, security protocols could fail, causing a breach of personal health information.  Furthermore, I acknowledge that it is my responsibility to provide information about my medical history, conditions and care that is complete and accurate to the best of my ability. I  acknowledge that Practitioner's advice, recommendations, and/or decision may be based on factors not within their control, such as incomplete or inaccurate data provided by me or distortions of diagnostic images or specimens that may result from electronic transmissions. I understand that the practice of medicine is not an exact science and that Practitioner makes no warranties or guarantees regarding treatment outcomes. I acknowledge that a copy of this consent can be made available to me via my patient portal [facility] MyChart), or I can request a printed copy by calling the office of [facility].    I understand that my insurance will be billed for this visit.   I have read or had this consent read to me. I understand the contents of this consent, which adequately explains the benefits and risks of the Services being provided via telemedicine.  I have been provided ample opportunity to ask questions regarding this consent and the Services and have had my questions answered to my satisfaction. I give my informed consent for the services to be provided through the use of telemedicine in my medical care

## 2021-10-01 ENCOUNTER — Other Ambulatory Visit: Payer: Self-pay | Admitting: Cardiovascular Disease

## 2021-10-01 ENCOUNTER — Other Ambulatory Visit: Payer: Medicare Other

## 2021-10-01 DIAGNOSIS — I10 Essential (primary) hypertension: Secondary | ICD-10-CM

## 2021-10-01 DIAGNOSIS — I2510 Atherosclerotic heart disease of native coronary artery without angina pectoris: Secondary | ICD-10-CM

## 2021-10-17 ENCOUNTER — Telehealth: Payer: Medicare Other | Admitting: Medical

## 2021-11-06 ENCOUNTER — Other Ambulatory Visit: Payer: Medicare Other

## 2021-11-07 ENCOUNTER — Ambulatory Visit (INDEPENDENT_AMBULATORY_CARE_PROVIDER_SITE_OTHER): Payer: Medicare Other | Admitting: Medical

## 2021-11-07 ENCOUNTER — Other Ambulatory Visit: Payer: Self-pay | Admitting: Cardiovascular Disease

## 2021-11-07 ENCOUNTER — Encounter: Payer: Self-pay | Admitting: Medical

## 2021-11-07 VITALS — Ht 63.0 in | Wt 185.0 lb

## 2021-11-07 DIAGNOSIS — R0600 Dyspnea, unspecified: Secondary | ICD-10-CM

## 2021-11-07 DIAGNOSIS — I5032 Chronic diastolic (congestive) heart failure: Secondary | ICD-10-CM

## 2021-11-07 DIAGNOSIS — I10 Essential (primary) hypertension: Secondary | ICD-10-CM

## 2021-11-07 DIAGNOSIS — Z01810 Encounter for preprocedural cardiovascular examination: Secondary | ICD-10-CM

## 2021-11-07 DIAGNOSIS — I2510 Atherosclerotic heart disease of native coronary artery without angina pectoris: Secondary | ICD-10-CM

## 2021-11-07 DIAGNOSIS — I25118 Atherosclerotic heart disease of native coronary artery with other forms of angina pectoris: Secondary | ICD-10-CM

## 2021-11-07 NOTE — Patient Instructions (Signed)
Medication Instructions:  Your physician recommends that you continue on your current medications as directed. Please refer to the Current Medication list given to you today.  *If you need a refill on your cardiac medications before your next appointment, please call your pharmacy*   Lab Work: None If you have labs (blood work) drawn today and your tests are completely normal, you will receive your results only by: MyChart Message (if you have MyChart) OR A paper copy in the mail If you have any lab test that is abnormal or we need to change your treatment, we will call you to review the results.   Testing/Procedures: Echo as scheduled  on 11/27/21     Follow-Up: At [facility], you and your health needs are our priority.  As part of our continuing mission to provide you with exceptional heart care, we have created designated Provider Care Teams.  These Care Teams include your primary Cardiologist (physician) and Advanced Practice Providers (APPs -  Physician Assistants and Nurse Practitioners) who all work together to provide you with the care you need, when you need it.  We recommend signing up for the patient portal called "MyChart".  Sign up information is provided on this After Visit Summary.  MyChart is used to connect with patients for Virtual Visits (Telemedicine).  Patients are able to view lab/test results, encounter notes, upcoming appointments, etc.  Non-urgent messages can be sent to your provider as well.   To learn more about what you can do with MyChart, go to [email or web address].    Your next appointment:   3 month(s)  The format for your next appointment:   In Person  Provider:   You may see Lorine Bears, MD or one of the following Advanced Practice Providers on your designated Care Team:   Nicolasa Ducking, NP Eula Listen, PA-C Cadence Fransico Michael, [place]    Other Instructions N/A  Important Information About Sugar

## 2021-11-07 NOTE — Progress Notes (Signed)
Virtual Visit via Telephone Note   Because of Jeff Rios's co-morbid illnesses, he is at least at moderate risk for complications without adequate follow up.  This format is felt to be most appropriate for this patient at this time.  The patient did not have access to video technology/had technical difficulties with video requiring transitioning to audio format only (telephone).  All issues noted in this document were discussed and addressed.  No physical exam could be performed with this format.  Please refer to the patient's chart for his consent to telehealth for [name].    Date:  11/07/2021   ID:  Jeff Rios, DOB February 06, 1966, MRN [id number] The patient was identified using 2 identifiers.  Patient Location: Other:  Counselling psychologist Location: Office/Clinic   PCP:  Patient, [name]   [facility] Providers Cardiologist:  Lorine Bears, MD Cardiology APP:  Lennon Alstrom, PA-C (Inactive) {    Evaluation Performed:  Follow-Up Visit  Chief Complaint:  echo follow-up  History of Present Illness:    Jeff Rios is a 56 y.o. male with h/o CAD (NSTEMI 03/2019 s/p DES to RCA and LAD), HTN, HLD, former tobacco abuse, idiopathic angioedema, who is pending colonoscopy, and presents today for telephonic preoperative cardiovascular risk assessment.   He was admitted 03/2019 with inferior ST elevation myocardial infarction.  He reported severe substernal chest discomfort that woke him from sleep.  Cardiac catheterization showed significant 2v CAD with an occluded distal RCA and significant mLAD stenosis.  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  He underwent successful PCI and DES to the distal RCA with staged LAD PCI.     He had Myoview lexiscan study for SOB 05/2020 that was unrevealing and echo was stable. RVSP 28.6 with nl PASP, G1DD, mild MR, and mild LVH.    07/30/20 LHC performed with previously  placed mLAD DES patent, rPDA 30%s, pRCA 30%s, non-stenotic dRCA previously treated and noted. No intervention performed with widely patent RCA and LAD stents without significant stenosis and no significant change since recent LHC. LV angiography not performed and echo noted to have nl EF. LVEDP moderately elevated with SBP elevated during LHC. Recommendation was for BP control and medical management.   He was last seen 09/04/21 for pre-operative risk for colonoscopy. Echo was ordered for DOE, however it was not completed.   Today, is an echo follow-up, but he did not get it done. He denies chest pain, but reports burning in his chest. He denies any acid reflux. He has some DOE, this is about the same as before. No lower leg edema, orthopnea, pnd. He has not had colonoscopy. He does not check vitals at home.    Past Medical History:  Diagnosis Date   CAD (coronary artery disease)    HLD (hyperlipidemia)    Hypertension    Past Surgical History:  Procedure Laterality Date   CARDIAC CATHETERIZATION     CORONARY STENT INTERVENTION N/A 03/18/2019   Procedure: CORONARY STENT INTERVENTION;  Surgeon: Iran Ouch, MD;  Location: [facility] INVASIVE CV LAB;  Service: Cardiovascular;  Laterality: N/A;   CORONARY STENT INTERVENTION N/A 03/20/2019   Procedure: CORONARY STENT INTERVENTION;  Surgeon: Iran Ouch, MD;  Location: [facility] INVASIVE CV LAB;  Service: Cardiovascular;  Laterality: N/A;   CORONARY/GRAFT ACUTE MI REVASCULARIZATION N/A 03/18/2019   Procedure: Coronary/Graft Acute MI Revascularization;  Surgeon: Iran Ouch, MD;  Location: [facility] INVASIVE CV LAB;  Service: Cardiovascular;  Laterality: N/A;   LEFT HEART CATH AND CORONARY ANGIOGRAPHY N/A 03/18/2019   Procedure: LEFT HEART CATH AND CORONARY ANGIOGRAPHY;  Surgeon: Wellington Hampshire, MD;  Location: [facility] CV LAB;  Service: Cardiovascular;  Laterality: N/A;   LEFT HEART CATH AND CORONARY ANGIOGRAPHY N/A 07/29/2020   Procedure:  LEFT HEART CATH AND CORONARY ANGIOGRAPHY;  Surgeon: Wellington Hampshire, MD;  Location: [place] CV LAB;  Service: Cardiovascular;  Laterality: N/A;     Current Meds  Medication Sig   Acetaminophen 500 MG capsule SMARTSIG:2 Capsule(s) By Mouth 3 Times Daily PRN   amLODipine (NORVASC) 10 MG tablet TAKE 1 TABLET BY MOUTH ONCE DAILY.   ASPIRIN LOW DOSE 81 MG EC tablet Take 81 mg by mouth daily.   atorvastatin (LIPITOR) 80 MG tablet TAKE 1 TABLET BY MOUTH ONCE DAILY.   carvedilol (COREG) 6.25 MG tablet TAKE 1 TABLET BY MOUTH 2 TIMES A DAY WITH A MEAL.   clopidogrel (PLAVIX) 75 MG tablet TAKE 1 TABLET BY MOUTH ONCE DAILY.   ezetimibe (ZETIA) 10 MG tablet TAKE 1 TABLET BY MOUTH ONCE DAILY.   lisinopril (ZESTRIL) 20 MG tablet TAKE 1 TABLET BY MOUTH ONCE DAILY.   nitroGLYCERIN (NITROSTAT) 0.4 MG SL tablet Place 1 tablet (0.4 mg total) under the tongue every 5 (five) minutes as needed for chest pain.     Allergies:   Amlodipine and Imdur [isosorbide nitrate]   Social History   Tobacco Use   Smoking status: Former    Packs/day: 1.00    Years: 30.00    Total pack years: 30.00    Types: Cigarettes   Smokeless tobacco: Never  Vaping Use   Vaping Use: Never used  Substance Use Topics   Alcohol use: No   Drug use: No     Family Hx: The patient's Family history is unknown by patient.  ROS:   Please see the history of present illness.     All other systems reviewed and are negative.   Prior CV studies:   The following studies were reviewed today:    LHC 07/29/20 Previously placed Mid LAD drug eluting stent is widely patent. RPDA lesion is 30% stenosed. Prox RCA lesion is 30% stenosed. Non-stenotic Dist RCA lesion was previously treated. 1.  Widely patent RCA and LAD stents with no significant restenosis.  No significant change from most recent cardiac catheterization. 2.  Left ventricular angiography was not performed.  EF was normal by echo. 3.  Left ventricular end-diastolic  pressure was moderately elevated in the 20s range but his systemic blood pressure was elevated during cardiac catheterization. Recommendations: Continue medical therapy and blood pressure control. Coronary Diagrams     Diagnostic Dominance: Right       Echo 06/28/20  1. Left ventricular ejection fraction, by estimation, is 60 to 65%. The  left ventricle has normal function. The left ventricle has no regional  wall motion abnormalities. There is mild left ventricular hypertrophy.  Left ventricular diastolic parameters  are consistent with Grade I diastolic dysfunction (impaired relaxation).   2. Right ventricular systolic function is normal. The right ventricular  size is normal. There is normal pulmonary artery systolic pressure. The  estimated right ventricular systolic pressure is [id number] mmHg.   3. The mitral valve is normal in structure. Mild mitral valve  regurgitation.    NM Study 07/03/20 There was no ST segment deviation noted during stress. No T wave inversion was noted during stress. The study is normal. This is a low  risk study. The left ventricular ejection fraction is hyperdynamic (>72%). There is no evidence for ischemia   PCI 03/20/2019 RPDA lesion is 40% stenosed. Prox RCA lesion is 30% stenosed. Previously placed Dist RCA drug eluting stent is widely patent. Balloon angioplasty was performed. Mid LAD lesion is 80% stenosed. Post intervention, there is a 0% residual stenosis. A drug-eluting stent was successfully placed using a STENT RESOLUTE ONYX 3.0X15. Successful angioplasty and drug-eluting stent placement to the mid LAD. Recommendations: Continue dual antiplatelet therapy for at least 1 year. Aggressive treatment of risk factors. Likely discharge home tomorrow. He is to stay off work for 2 weeks.     Echo 03/18/19  1. Left ventricular ejection fraction, by visual estimation, is 60 to  65%. The left ventricle has normal function. There is mildly  increased  left ventricular hypertrophy.   2. Left ventricular diastolic parameters are consistent with Grade I  diastolic dysfunction (impaired relaxation).   3. Global right ventricle has normal systolic function.The right  ventricular size is normal. No increase in right ventricular wall  thickness.   4. The mitral valve is normal in structure. No evidence of mitral valve  regurgitation. No evidence of mitral stenosis.   5. The tricuspid valve is normal in structure. Tricuspid valve  regurgitation is not demonstrated.   6. The aortic valve is normal in structure. Aortic valve regurgitation is  not visualized. No evidence of aortic valve sclerosis or stenosis.   7. The pulmonic valve was normal in structure. Pulmonic valve  regurgitation is trivial.   8. TR signal is inadequate for assessing pulmonary artery systolic  pressure.   9. The inferior vena cava is normal in size with greater than 50%  respiratory variability, suggesting right atrial pressure of 3 mmHg.    03/18/19 PCI The left ventricular systolic function is normal. LV end diastolic pressure is normal. The left ventricular ejection fraction is 55-65% by visual estimate. Mid LAD lesion is 80% stenosed. Prox RCA lesion is 30% stenosed. Dist RCA lesion is 100% stenosed. Post intervention, there is a 0% residual stenosis. A drug-eluting stent was successfully placed using a STENT RESOLUTE ONYX 2.25X22. RPDA lesion is 40% stenosed. 1.  Significant two-vessel coronary artery disease.  The culprit is an occluded distal right coronary artery with faint left-to-right collaterals.  The supplied area is not large and that is the likely reason for nondiagnostic EKG changes for inferior STEMI.  There is also significant mid LAD stenosis which appears to be hazy. 2.  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   Significantly elevated LVEDP in the low 30s. 3.  Successful angioplasty and drug-eluting stent placement to the distal RCA. Recommendations: Continue dual antiplatelet therapy for at least 1 year.  Aggressive treatment of risk factors. Recommend staged PCI of the LAD before hospital discharge.  This is planned for Monday morning.    Labs/Other Tests and Data Reviewed:    EKG:  No ECG reviewed.  Recent Labs: No results found for requested labs within last 365 days.   Recent Lipid Panel Lab Results  Component Value Date/Time   CHOL 99 (L) 04/07/2021 11:05 AM   TRIG 56 04/07/2021 11:05 AM   HDL 51 04/07/2021 11:05 AM   CHOLHDL 1.9 04/07/2021 11:05 AM   CHOLHDL 3.5 09/05/2019 12:21 PM   LDLCALC 35 04/07/2021 11:05 AM    Wt Readings from Last 3 Encounters:  11/07/21  185 lb (83.9 kg)  09/04/21 186 lb 6 oz (84.5 kg)  04/07/21 189 lb (85.7 kg)        Objective:    Vital Signs:  Ht 5\' 3"  (1.6 m)   Wt 185 lb (83.9 kg)   BMI 32.77 kg/m    VITAL SIGNS:  reviewed  ASSESSMENT & PLAN:     Pre-operative cardiovascular risk prior to colonoscopy DOE CAD s/p DES to RCA and LAD HFpEF Patient reported DOE on his initial pre-op visit, so he was set up for an office visit. An echo was ordered, but canceled yesterday by the patient. He says he will reschedule this. He reports chest burning at times. No LLE, orthopnea, or pnd. Plan to stop Plavix if the echo is OK, and pursue colonoscopy. Prior EKG was without ischemic changes. METS>4. According to the RCRI he is Class 3 risk, 10.1% 30 day risk of death, MI, or cardiac arrest.      Time:   Today, I have spent 15-20 minutes with the patient with telehealth technology discussing the above problems.     Medication Adjustments/Labs and Tests Ordered: Current medicines are reviewed at length with the patient today.  Concerns regarding medicines are outlined above.   Tests Ordered: No orders of the defined types were placed in this  encounter.   Medication Changes: No orders of the defined types were placed in this encounter.   Follow Up:  In Person in 3 month(s)  Signed, Marcea Rojek , PA-C  11/07/2021 3:58 PM    [facility]

## 2021-11-27 ENCOUNTER — Ambulatory Visit (INDEPENDENT_AMBULATORY_CARE_PROVIDER_SITE_OTHER): Payer: Medicare Other

## 2021-11-27 DIAGNOSIS — R0609 Other forms of dyspnea: Secondary | ICD-10-CM | POA: Diagnosis not present

## 2021-11-28 LAB — ECHOCARDIOGRAM COMPLETE
AR max vel: 3.45 cm2
AV Area VTI: 3.91 cm2
AV Area mean vel: 3.21 cm2
AV Mean grad: 2 mmHg
AV Peak grad: 3.8 mmHg
Ao pk vel: 0.98 m/s
Area-P 1/2: 3.23 cm2
Calc EF: 60.9 %
S' Lateral: 2 cm
Single Plane A2C EF: 60.6 %
Single Plane A4C EF: 59.8 %

## 2021-12-15 ENCOUNTER — Other Ambulatory Visit: Payer: Self-pay | Admitting: Cardiovascular Disease

## 2021-12-15 DIAGNOSIS — I2510 Atherosclerotic heart disease of native coronary artery without angina pectoris: Secondary | ICD-10-CM

## 2021-12-15 DIAGNOSIS — I10 Essential (primary) hypertension: Secondary | ICD-10-CM

## 2022-01-12 ENCOUNTER — Other Ambulatory Visit: Payer: Self-pay

## 2022-01-12 ENCOUNTER — Encounter: Payer: Self-pay | Admitting: Emergency Medicine

## 2022-01-12 ENCOUNTER — Emergency Department: Payer: Medicare Other

## 2022-01-12 ENCOUNTER — Emergency Department
Admission: EM | Admit: 2022-01-12 | Discharge: 2022-01-12 | Disposition: A | Discharge status: Home / Self Care | Payer: Medicare Other | Attending: Emergency Medicine | Admitting: Emergency Medicine

## 2022-01-12 DIAGNOSIS — X500XXA Overexertion from strenuous movement or load, initial encounter: Secondary | ICD-10-CM | POA: Insufficient documentation

## 2022-01-12 DIAGNOSIS — Y9389 Activity, other specified: Secondary | ICD-10-CM | POA: Insufficient documentation

## 2022-01-12 DIAGNOSIS — I10 Essential (primary) hypertension: Secondary | ICD-10-CM | POA: Diagnosis not present

## 2022-01-12 DIAGNOSIS — S4992XA Unspecified injury of left shoulder and upper arm, initial encounter: Secondary | ICD-10-CM | POA: Diagnosis present

## 2022-01-12 DIAGNOSIS — S46912A Strain of unspecified muscle, fascia and tendon at shoulder and upper arm level, left arm, initial encounter: Secondary | ICD-10-CM | POA: Diagnosis not present

## 2022-01-12 NOTE — ED Provider Notes (Signed)
[facility] Provider Note    Event Date/Time   First MD Initiated Contact with Patient 01/12/22 1118     (approximate)   History   Shoulder Injury   HPI  Jeff Rios is a 56 y.o. male with history of hypertension, hyperlipidemia, ACS presents emergency department with left shoulder injury.  Patient states he was helping lift a grill when he noticed slight pain in the left shoulder.  States area is very bruised and swollen.  No numbness or tingling.  Did not feel a pop or snap      Physical Exam   Triage Vital Signs: ED Triage Vitals  Enc Vitals Group     BP 01/12/22 1054 (!) 144/93     Pulse Rate 01/12/22 1054 80     Resp 01/12/22 1054 18     Temp 01/12/22 1054 98.4 F (36.9 C)     Temp Source 01/12/22 1054 Oral     SpO2 01/12/22 1054 97 %     Weight 01/12/22 1049 180 lb (81.6 kg)     Height 01/12/22 1049 5\' 3"  (1.6 m)     Head Circumference --      Peak Flow --      Pain Score 01/12/22 1049 0     Pain Loc --      Pain Edu? --      Excl. in GC? --     Most recent vital signs: Vitals:   01/12/22 1054  BP: (!) 144/93  Pulse: 80  Resp: 18  Temp: 98.4 F (36.9 C)  SpO2: 97%     General: Awake, no distress.   CV:  Good peripheral perfusion. regular rate and  rhythm Resp:  Normal effort.  Abd:  No distention.   Other:  Left shoulder with bruising across the anterior rotator cuff and into the left arm up to the bicep, full range of motion, no prominent muscle noted, neurovascular   ED Results / Procedures / Treatments   Labs (all labs ordered are listed, but only abnormal results are displayed) Labs Reviewed - No data to display   EKG     RADIOLOGY X-ray of the left shoulder    PROCEDURES:   Procedures   MEDICATIONS ORDERED IN ED: Medications - No data to display   IMPRESSION / MDM / ASSESSMENT AND PLAN / ED COURSE  I reviewed the triage vital signs and the nursing notes.                               Differential diagnosis includes, but is not limited to, contusion, sprain, fracture,, muscle or tendon rupture  Patient's presentation is most consistent with acute complicated illness / injury requiring diagnostic workup.   X-ray of the left shoulder  Left shoulder independently reviewed and interpreted by me as being negative for any acute abnormality.  I did explain findings to the patient.  Feel that he most likely has ruptured a muscle or tendon.  He can follow-up with orthopedics.  He is to wear a sling until seen by orthopedics.  He was given a work note saying he cannot use left shoulder until released by orthopedics.  Tylenol or ibuprofen for pain as needed.  Apply ice.  He is discharged stable condition is in agreement treatment plan.     FINAL CLINICAL IMPRESSION(S) / ED DIAGNOSES   Final diagnoses:  Muscle strain of left shoulder, initial encounter  Rx / DC Orders   ED Discharge Orders     None        Note:  This document was prepared using Dragon voice recognition software and may include unintentional dictation errors.    Faythe Ghee, PA-C 01/12/22 1242    Chesley Noon, MD 01/12/22 [phone]

## 2022-01-12 NOTE — ED Notes (Signed)
See triage note  Presents with pain to left shoulder  States he lifted a grill on Saturday   Developed pain with some bruising to shoulder

## 2022-01-12 NOTE — Discharge Instructions (Addendum)
Follow-up with orthopedics.  Wear the sling until seen by orthopedics.  Take Tylenol and ibuprofen for pain if needed.  Return emergency department if worsening.

## 2022-01-12 NOTE — ED Triage Notes (Signed)
Pt via POV from home. Pt c/o L sided shoulder injury, states he was lifting up a grill on Saturday then noticed a bruise to the L upper forearm, states it is sore but it is not painful. States he was on blood thinner previously but not on it currently. Pt is A&Ox4 and NAD

## 2022-01-15 ENCOUNTER — Other Ambulatory Visit: Payer: Self-pay | Admitting: Cardiovascular Disease

## 2022-01-15 DIAGNOSIS — I10 Essential (primary) hypertension: Secondary | ICD-10-CM

## 2022-01-15 DIAGNOSIS — I2510 Atherosclerotic heart disease of native coronary artery without angina pectoris: Secondary | ICD-10-CM

## 2022-02-10 ENCOUNTER — Ambulatory Visit: Payer: Medicare Other | Attending: Medical | Admitting: Medical

## 2022-02-10 NOTE — Progress Notes (Deleted)
Cardiology Office Note:    Date:  02/10/2022   ID:  Jeff Rios, DOB Feb 27, 1966, MRN [id number]  PCP:  Pcp, No  [facility] Cardiologist:  Kathlyn Sacramento, MD  [place] Electrophysiologist:  None   Referring MD: No ref. provider found   Chief Complaint: ***  History of Present Illness:    Jeff Rios is a 56 y.o. male with a hx of h/o CAD (NSTEMI 03/2019 s/p DES to RCA and LAD), HTN, HLD, former tobacco abuse, idiopathic angioedema, who is pending colonoscopy, and presents today for telephonic preoperative cardiovascular risk assessment.   He was admitted 03/2019 with inferior ST elevation myocardial infarction.  He reported severe substernal chest discomfort that woke him from sleep.  Cardiac catheterization showed significant 2v CAD with an occluded distal RCA and significant mLAD stenosis.  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  He underwent successful PCI and DES to the distal RCA with staged LAD PCI.     He had Craig study for SOB 05/2020 that was unrevealing and echo was stable. RVSP 28.6 with nl PASP, G1DD, mild MR, and mild LVH.    07/30/20 LHC performed with previously placed mLAD DES patent, rPDA 30%s, pRCA 30%s, non-stenotic dRCA previously treated and noted. No intervention performed with widely patent RCA and LAD stents without significant stenosis and no significant change since recent LHC. LV angiography not performed and echo noted to have nl EF. LVEDP moderately elevated with SBP elevated during LHC. Recommendation was for BP control and medical management.    He was seen 09/04/21 for pre-operative risk for colonoscopy. Echo was ordered for DOE, however it was not completed.    Last seen 11/07/21 for repeat pre-op visit. He reported DOE and he was set up for echo. Echo showed LVEF 60-65%, G1DD, moderate LVH, mild MR. OK for patient to undergo colonoscopy.  Past Medical History:  Diagnosis Date    CAD (coronary artery disease)    HLD (hyperlipidemia)    Hypertension     Past Surgical History:  Procedure Laterality Date   CARDIAC CATHETERIZATION     CORONARY STENT INTERVENTION N/A 03/18/2019   Procedure: CORONARY STENT INTERVENTION;  Surgeon: Wellington Hampshire, MD;  Location: [place] [facility];  Service: Cardiovascular;  Laterality: N/A;   CORONARY STENT INTERVENTION N/A 03/20/2019   Procedure: CORONARY STENT INTERVENTION;  Surgeon: Wellington Hampshire, MD;  Location: [place] [facility];  Service: Cardiovascular;  Laterality: N/A;   CORONARY/GRAFT ACUTE MI REVASCULARIZATION N/A 03/18/2019   Procedure: Coronary/Graft Acute MI Revascularization;  Surgeon: Wellington Hampshire, MD;  Location: [place] [facility];  Service: Cardiovascular;  Laterality: N/A;   LEFT HEART CATH AND CORONARY ANGIOGRAPHY N/A 03/18/2019   Procedure: LEFT HEART CATH AND CORONARY ANGIOGRAPHY;  Surgeon: Wellington Hampshire, MD;  Location: [place] [facility];  Service: Cardiovascular;  Laterality: N/A;   LEFT HEART CATH AND CORONARY ANGIOGRAPHY N/A 07/29/2020   Procedure: LEFT HEART CATH AND CORONARY ANGIOGRAPHY;  Surgeon: Wellington Hampshire, MD;  Location: [place] [facility];  Service: Cardiovascular;  Laterality: N/A;    Current Medications: No outpatient medications have been marked as taking for the 02/10/22 encounter (Appointment) with Kathlen Mody, Jolea Dolle H, PA-C.     Allergies:   Amlodipine and Imdur [isosorbide nitrate]   Social History   Socioeconomic History   Marital status: Single    Spouse name: Not on file   Number of children: Not on file   Years of  education: Not on file   Highest education level: Not on file  Occupational History   Not on file  Tobacco Use   Smoking status: Former    Packs/day: 1.00    Years: 30.00    Total pack years: 30.00    Types: Cigarettes   Smokeless tobacco: Never  Vaping Use   Vaping Use: Never used  Substance and Sexual Activity   Alcohol use: No   Drug  use: No   Sexual activity: Not on file  Other Topics Concern   Not on file  Social History Narrative   Not on file   Social Determinants of Health   Financial Resource Strain: Not on file  Food Insecurity: Not on file  Transportation Needs: No Transportation Needs (09/02/2021)   PRAPARE - Administrator, Civil Service (Medical): No    Lack of Transportation (Non-Medical): No  Physical Activity: Not on file  Stress: Not on file  Social Connections: Not on file     Family History: The patient's ***Family history is unknown by patient.  ROS:   Please see the history of present illness.    *** All other systems reviewed and are negative.  EKGs/Labs/Other Studies Reviewed:    The following studies were reviewed today: ***  EKG:  EKG is *** ordered today.  The ekg ordered today demonstrates ***  Recent Labs: No results found for requested labs within last 365 days.  Recent Lipid Panel    Component Value Date/Time   CHOL 99 (L) 04/07/2021 1105   TRIG 56 04/07/2021 1105   HDL 51 04/07/2021 1105   CHOLHDL 1.9 04/07/2021 1105   CHOLHDL 3.5 09/05/2019 1221   VLDL 23 09/05/2019 1221   LDLCALC 35 04/07/2021 1105     Risk Assessment/Calculations:   {Does this patient have ATRIAL FIBRILLATION?:[phone]}   Physical Exam:    VS:  There were no vitals taken for this visit.    Wt Readings from Last 3 Encounters:  01/12/22 180 lb (81.6 kg)  11/07/21 185 lb (83.9 kg)  09/04/21 186 lb 6 oz (84.5 kg)     GEN: *** Well nourished, well developed in no acute distress HEENT: Normal NECK: No JVD; No carotid bruits LYMPHATICS: No lymphadenopathy CARDIAC: ***RRR, no murmurs, rubs, gallops RESPIRATORY:  Clear to auscultation without rales, wheezing or rhonchi  ABDOMEN: Soft, non-tender, non-distended MUSCULOSKELETAL:  No edema; No deformity  SKIN: Warm and dry NEUROLOGIC:  Alert and oriented x 3 PSYCHIATRIC:  Normal affect   ASSESSMENT:    No diagnosis  found. PLAN:    In order of problems listed above:  ***  Disposition: Follow up {follow up:15908} with ***   Shared Decision Making/Informed Consent   {Are you ordering a CV Procedure (e.g. stress test, cath, DCCV, TEE, etc)?   Press F2        :[id number]}    Signed, Maire Govan David Stall, PA-C  02/10/2022 8:36 AM    [facility]

## 2022-02-11 ENCOUNTER — Encounter: Payer: Self-pay | Admitting: Medical

## 2022-02-23 ENCOUNTER — Other Ambulatory Visit: Payer: Self-pay | Admitting: Cardiovascular Disease

## 2022-02-23 DIAGNOSIS — I10 Essential (primary) hypertension: Secondary | ICD-10-CM

## 2022-02-23 DIAGNOSIS — I2510 Atherosclerotic heart disease of native coronary artery without angina pectoris: Secondary | ICD-10-CM

## 2022-02-23 NOTE — Telephone Encounter (Signed)
Hi,   Could you please schedule this patient a 3 month follow up visit? His last visit was with Cadence on 11/07/21. Thank you so much.

## 2022-02-24 ENCOUNTER — Telehealth: Payer: Self-pay | Admitting: Cardiovascular Disease

## 2022-02-24 NOTE — Telephone Encounter (Signed)
*  STAT* If patient is at the pharmacy, call can be transferred to refill team.   1. Which medications need to be refilled? (please list name of each medication and dose if known)  amLODipine (NORVASC) 10 MG tablet atorvastatin (LIPITOR) 80 MG tablet carvedilol (COREG) 6.25 MG tablet clopidogrel (PLAVIX) 75 MG tablet ezetimibe (ZETIA) 10 MG tablet lisinopril (ZESTRIL) 20 MG tablet  2. Which pharmacy/location (including street and city if local pharmacy) is medication to be sent to?  [place], [place]  3. Do they need a 30 day or 90 day supply? [place]

## 2022-02-24 NOTE — Telephone Encounter (Signed)
Please schedule overdue F/U appointment. Patient did not show for last scheduled office visit. Thank you! 

## 2022-03-16 ENCOUNTER — Ambulatory Visit: Payer: Medicare Other | Admitting: Medical

## 2022-03-25 ENCOUNTER — Other Ambulatory Visit: Payer: Self-pay | Admitting: Cardiovascular Disease

## 2022-03-25 DIAGNOSIS — I10 Essential (primary) hypertension: Secondary | ICD-10-CM

## 2022-03-25 DIAGNOSIS — I2510 Atherosclerotic heart disease of native coronary artery without angina pectoris: Secondary | ICD-10-CM

## 2022-04-06 ENCOUNTER — Ambulatory Visit: Payer: Medicare Other | Admitting: Medical

## 2022-04-06 NOTE — Progress Notes (Deleted)
Cardiology Office Note:    Date:  04/06/2022   ID:  Jeff Rios, DOB May 29, 1965, MRN [id number]  PCP:  Pcp, No  [facility] Cardiologist:  Jeff BearsMuhammad Arida, MD  [facility] Elect[facility]iologist:  None   Referring MD: No ref. provider found   Chief Complaint: 3 month follow-up  History of Present Illness:    Jeff RicksMickey Rios is a 56 y.o. male with a hx of CAD (NSTEMI 03/2019 s/p DES to RCA and LAD), HTN, HLD, former tobacco abuse, idiopathic angioedema, who is pending colonoscopy, and presents today for telephonic preoperative cardiovascular risk assessment.   He was admitted 03/2019 with inferior ST elevation myocardial infarction.  He reported severe substernal chest discomfort that woke him from sleep.  Cardiac catheterization showed significant 2v CAD with an occluded distal RCA and significant mLAD stenosis.  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  He underwent successful PCI and DES to the distal RCA with staged LAD PCI.     He had Myoview lexiscan study for SOB 05/2020 that was unrevealing and echo was stable. RVSP 28.6 with nl PASP, G1DD, mild MR, and mild LVH.    07/30/20 LHC performed with previously placed mLAD DES patent, rPDA 30%s, pRCA 30%s, non-stenotic dRCA previously treated and noted. No intervention performed with widely patent RCA and LAD stents without significant stenosis and no significant change since recent LHC. LV angiography not performed and echo noted to have nl EF. LVEDP moderately elevated with SBP elevated during LHC. Recommendation was for BP control and medical management.    He was last seen 09/04/21 for pre-operative risk for colonoscopy. Echo was ordered for DOE, however it was not completed.   Last seen 11/07/21 pre-operative cardiac clearance. An echo had been ordered. Echo showed LVEF 60-65%, no WMA, G1DD, mild MR.  Today,     Past Medical History:  Diagnosis Date   CAD (coronary artery disease)     HLD (hyperlipidemia)    Hypertension     Past Surgical History:  Procedure Laterality Date   CARDIAC CATHETERIZATION     CORONARY STENT INTERVENTION N/A 03/18/2019   Procedure: CORONARY STENT INTERVENTION;  Surgeon: Iran OuchArida, Muhammad A, MD;  Location: [facility];  Service: Cardiovascular;  Laterality: N/A;   CORONARY STENT INTERVENTION N/A 03/20/2019   Procedure: CORONARY STENT INTERVENTION;  Surgeon: Iran OuchArida, Muhammad A, MD;  Location: [facility];  Service: Cardiovascular;  Laterality: N/A;   CORONARY/GRAFT ACUTE MI REVASCULARIZATION N/A 03/18/2019   Procedure: Coronary/Graft Acute MI Revascularization;  Surgeon: Iran OuchArida, Muhammad A, MD;  Location: [facility];  Service: Cardiovascular;  Laterality: N/A;   LEFT HEART CATH AND CORONARY ANGIOGRAPHY N/A 03/18/2019   Procedure: LEFT HEART CATH AND CORONARY ANGIOGRAPHY;  Surgeon: Iran OuchArida, Muhammad A, MD;  Location: [facility];  Service: Cardiovascular;  Laterality: N/A;   LEFT HEART CATH AND CORONARY ANGIOGRAPHY N/A 07/29/2020   Procedure: LEFT HEART CATH AND CORONARY ANGIOGRAPHY;  Surgeon: Iran OuchArida, Muhammad A, MD;  Location: [facility];  Service: Cardiovascular;  Laterality: N/A;    Current Medications: No outpatient medications have been marked as taking for the 04/06/22 encounter (Appointment) with Fransico MichaelFurth, Jeral Zick H, PA-C.     Allergies:   Amlodipine and Imdur [isosorbide nitrate]   Social History   Socioeconomic History   Marital status: Single    Spouse name: Not on file   Number of children: Not on file   Years of education: Not on file  Highest education level: Not on file  Occupational History   Not on file  Tobacco Use   Smoking status: Former    Packs/day: 1.00    Years: 30.00    Total pack years: 30.00    Types: Cigarettes   Smokeless tobacco: Never  Vaping Use   Vaping Use: Never used  Substance and Sexual Activity   Alcohol use: No   Drug use: No   Sexual activity: Not on  file  Other Topics Concern   Not on file  Social History Narrative   Not on file   Social Determinants of Health   Financial Resource Strain: Not on file  Food Insecurity: Not on file  Transportation Needs: No Transportation Needs (09/02/2021)   PRAPARE - Administrator, Civil Service (Medical): No    Lack of Transportation (Non-Medical): No  Physical Activity: Not on file  Stress: Not on file  Social Connections: Not on file     Family History: The patient's Family history is unknown by patient.  ROS:   Please see the history of present illness.     All other systems reviewed and are negative.  EKGs/Labs/Other Studies Reviewed:    The following studies were reviewed today:  Echo 11/2021  1. Left ventricular ejection fraction, by estimation, is 60 to 65%. The  left ventricle has normal function. The left ventricle has no regional  wall motion abnormalities. There is moderate left ventricular hypertrophy.  Left ventricular diastolic  parameters are consistent with Grade I diastolic dysfunction (impaired  relaxation).   2. Right ventricular systolic function is normal. The right ventricular  size is normal. Tricuspid regurgitation signal is inadequate for assessing  PA pressure.   3. The mitral valve is normal in structure. Mild mitral valve  regurgitation. No evidence of mitral stenosis.   4. The aortic valve was not well visualized. Aortic valve regurgitation  is not visualized. No aortic stenosis is present.   5. The inferior vena cava is normal in size with greater than 50%  respiratory variability, suggesting right atrial pressure of 3 mmHg.   LHC 07/29/20 Previously placed Mid LAD drug eluting stent is widely patent. RPDA lesion is 30% stenosed. Prox RCA lesion is 30% stenosed. Non-stenotic Dist RCA lesion was previously treated. 1.  Widely patent RCA and LAD stents with no significant restenosis.  No significant change from most recent cardiac  catheterization. 2.  Left ventricular angiography was not performed.  EF was normal by echo. 3.  Left ventricular end-diastolic pressure was moderately elevated in the 20s range but his systemic blood pressure was elevated during cardiac catheterization. Recommendations: Continue medical therapy and blood pressure control. Coronary Diagrams     Diagnostic Dominance: Right       Echo 06/28/20  1. Left ventricular ejection fraction, by estimation, is 60 to 65%. The  left ventricle has normal function. The left ventricle has no regional  wall motion abnormalities. There is mild left ventricular hypertrophy.  Left ventricular diastolic parameters  are consistent with Grade I diastolic dysfunction (impaired relaxation).   2. Right ventricular systolic function is normal. The right ventricular  size is normal. There is normal pulmonary artery systolic pressure. The  estimated right ventricular systolic pressure is 28.6 mmHg.   3. The mitral valve is normal in structure. Mild mitral valve  regurgitation.    NM Study 07/03/20 There was no ST segment deviation noted during stress. No T wave inversion was noted during stress. The study is  normal. This is a low risk study. The left ventricular ejection fraction is hyperdynamic (>72%). There is no evidence for ischemia   PCI 03/20/2019 RPDA lesion is 40% stenosed. Prox RCA lesion is 30% stenosed. Previously placed Dist RCA drug eluting stent is widely patent. Balloon angioplasty was performed. Mid LAD lesion is 80% stenosed. Post intervention, there is a 0% residual stenosis. A drug-eluting stent was successfully placed using a STENT RESOLUTE ONYX 3.0X15. Successful angioplasty and drug-eluting stent placement to the mid LAD. Recommendations: Continue dual antiplatelet therapy for at least 1 year. Aggressive treatment of risk factors. Likely discharge home tomorrow. He is to stay off work for 2 weeks.     Echo 03/18/19  1. Left  ventricular ejection fraction, by visual estimation, is 60 to  65%. The left ventricle has normal function. There is mildly increased  left ventricular hypertrophy.   2. Left ventricular diastolic parameters are consistent with Grade I  diastolic dysfunction (impaired relaxation).   3. Global right ventricle has normal systolic function.The right  ventricular size is normal. No increase in right ventricular wall  thickness.   4. The mitral valve is normal in structure. No evidence of mitral valve  regurgitation. No evidence of mitral stenosis.   5. The tricuspid valve is normal in structure. Tricuspid valve  regurgitation is not demonstrated.   6. The aortic valve is normal in structure. Aortic valve regurgitation is  not visualized. No evidence of aortic valve sclerosis or stenosis.   7. The pulmonic valve was normal in structure. Pulmonic valve  regurgitation is trivial.   8. TR signal is inadequate for assessing pulmonary artery systolic  pressure.   9. The inferior vena cava is normal in size with greater than 50%  respiratory variability, suggesting right atrial pressure of 3 mmHg.    03/18/19 PCI The left ventricular systolic function is normal. LV end diastolic pressure is normal. The left ventricular ejection fraction is 55-65% by visual estimate. Mid LAD lesion is 80% stenosed. Prox RCA lesion is 30% stenosed. Dist RCA lesion is 100% stenosed. Post intervention, there is a 0% residual stenosis. A drug-eluting stent was successfully placed using a STENT RESOLUTE ONYX 2.25X22. RPDA lesion is 40% stenosed. 1.  Significant two-vessel coronary artery disease.  The culprit is an occluded distal right coronary artery with faint left-to-right collaterals.  The supplied area is not large and that is the likely reason for nondiagnostic EKG changes for inferior STEMI.  There is also significant mid LAD stenosis which appears to be hazy. 2.  Normal LV systolic function with Spade-like  appearance of the left ventricle and mid cavity gradient suggestive of variant form of hypertrophic cardiomyopathy.  Significantly elevated LVEDP in the low 30s. 3.  Successful angioplasty and drug-eluting stent placement to the distal RCA. Recommendations: Continue dual antiplatelet therapy for at least 1 year.  Aggressive treatment of risk factors. Recommend staged PCI of the LAD before hospital discharge.  This is planned for Monday morning.    EKG:  EKG is *** ordered today.  The ekg ordered today demonstrates ***  Recent Labs: No results found for requested labs within last 365 days.  Recent Lipid Panel    Component Value Date/Time   CHOL 99 (L) 04/07/2021 1105   TRIG 56 04/07/2021 1105   HDL 51 04/07/2021 1105   CHOLHDL 1.9 04/07/2021 1105   CHOLHDL 3.5 09/05/2019 1221   VLDL 23 09/05/2019 1221   LDLCALC 35 04/07/2021 1105     Risk Assessment/Calculations:   {  Does this patient have ATRIAL FIBRILLATION?:[phone]}   Physical Exam:    VS:  There were no vitals taken for this visit.    Wt Readings from Last 3 Encounters:  01/12/22 180 lb (81.6 kg)  11/07/21 185 lb (83.9 kg)  09/04/21 186 lb 6 oz (84.5 kg)     GEN: *** Well nourished, well developed in no acute distress HEENT: Normal NECK: No JVD; No carotid bruits LYMPHATICS: No lymphadenopathy CARDIAC: ***RRR, no murmurs, rubs, gallops RESPIRATORY:  Clear to auscultation without rales, wheezing or rhonchi  ABDOMEN: Soft, non-tender, non-distended MUSCULOSKELETAL:  No edema; No deformity  SKIN: Warm and dry NEUROLOGIC:  Alert and oriented x 3 PSYCHIATRIC:  Normal affect   ASSESSMENT:    No diagnosis found. PLAN:    In order of problems listed above:  ***  Disposition: Follow up {follow up:15908} with ***   Shared Decision Making/Informed Consent   {Are you ordering a CV Procedure (e.g. stress test, cath, DCCV, TEE, etc)?   Press F2        :[id number]}    Signed, Mayan Kloepfer David Stall, PA-C  04/06/2022  7:49 AM    [facility]

## 2022-05-08 ENCOUNTER — Emergency Department
Admission: EM | Admit: 2022-05-08 | Discharge: 2022-05-08 | Disposition: A | Discharge status: Home / Self Care | Payer: Medicare Other | Attending: Emergency Medicine | Admitting: Emergency Medicine

## 2022-05-08 ENCOUNTER — Emergency Department: Payer: Medicare Other

## 2022-05-08 ENCOUNTER — Other Ambulatory Visit: Payer: Self-pay

## 2022-05-08 ENCOUNTER — Encounter: Payer: Self-pay | Admitting: Emergency Medicine

## 2022-05-08 DIAGNOSIS — J101 Influenza due to other identified influenza virus with other respiratory manifestations: Secondary | ICD-10-CM | POA: Insufficient documentation

## 2022-05-08 DIAGNOSIS — I2510 Atherosclerotic heart disease of native coronary artery without angina pectoris: Secondary | ICD-10-CM | POA: Insufficient documentation

## 2022-05-08 DIAGNOSIS — Z20822 Contact with and (suspected) exposure to covid-19: Secondary | ICD-10-CM | POA: Insufficient documentation

## 2022-05-08 DIAGNOSIS — I10 Essential (primary) hypertension: Secondary | ICD-10-CM | POA: Diagnosis not present

## 2022-05-08 DIAGNOSIS — R059 Cough, unspecified: Secondary | ICD-10-CM | POA: Diagnosis present

## 2022-05-08 LAB — CBC WITH DIFFERENTIAL/PLATELET
Abs Immature Granulocytes: 0.02 10*3/uL (ref 0.00–0.07)
Basophils Absolute: 0 10*3/uL (ref 0.0–0.1)
Basophils Relative: 0 %
Eosinophils Absolute: 0 10*3/uL (ref 0.0–0.5)
Eosinophils Relative: 0 %
HCT: 41 % (ref 39.0–52.0)
Hemoglobin: 14.6 g/dL (ref 13.0–17.0)
Immature Granulocytes: 0 %
Lymphocytes Relative: 20 %
Lymphs Abs: 1.2 10*3/uL (ref 0.7–4.0)
MCH: 28.7 pg (ref 26.0–34.0)
MCHC: 35.6 g/dL (ref 30.0–36.0)
MCV: 80.6 fL (ref 80.0–100.0)
Monocytes Absolute: 0.8 10*3/uL (ref 0.1–1.0)
Monocytes Relative: 14 %
Neutro Abs: 3.8 10*3/uL (ref 1.7–7.7)
Neutrophils Relative %: 66 %
Platelets: 181 10*3/uL (ref 150–400)
RBC: 5.09 MIL/uL (ref 4.22–5.81)
RDW: 13.7 % (ref 11.5–15.5)
WBC: 5.9 10*3/uL (ref 4.0–10.5)
nRBC: 0 % (ref 0.0–0.2)

## 2022-05-08 LAB — BASIC METABOLIC PANEL
Anion gap: 7 (ref 5–15)
BUN: 20 mg/dL (ref 6–20)
CO2: 26 mmol/L (ref 22–32)
Calcium: 8.4 mg/dL — ABNORMAL LOW (ref 8.9–10.3)
Chloride: 103 mmol/L (ref 98–111)
Creatinine, Ser: 1.11 mg/dL (ref 0.61–1.24)
GFR, Estimated: >60 mL/min (ref >60)
Glucose, Bld: 108 mg/dL — ABNORMAL HIGH (ref 70–99)
Potassium: 4.2 mmol/L (ref 3.5–5.1)
Sodium: 136 mmol/L (ref 135–145)

## 2022-05-08 LAB — TROPONIN I (HIGH SENSITIVITY): Troponin I (High Sensitivity): 11 ng/L (ref <18)

## 2022-05-08 LAB — RESP PANEL BY RT-PCR (RSV, FLU A&B, COVID)  RVPGX2
Influenza A by PCR: POSITIVE — AB
Influenza B by PCR: NEGATIVE
Resp Syncytial Virus by PCR: NEGATIVE
SARS Coronavirus 2 by RT PCR: NEGATIVE

## 2022-05-08 MED ORDER — ACETAMINOPHEN 325 MG PO TABS
650.0000 mg | ORAL_TABLET | Freq: Once | ORAL | Status: AC
  Administered 2022-05-08: 650 mg via ORAL
  Filled 2022-05-08: qty 2

## 2022-05-08 NOTE — Discharge Instructions (Signed)
Your swab is positive for influenza A.  Your blood work and chest x-ray are normal.  Please follow-up with your outpatient provider.  Please return for any new, worsening, or change in symptoms or other concerns.

## 2022-05-08 NOTE — ED Provider Notes (Signed)
[facility] Provider Note    Event Date/Time   First MD Initiated Contact with Patient 05/08/22 1411     (approximate)   History   Cough   HPI  Jeff Rios is a 57 y.o. male with a past medical history of hypertension, hyperlipidemia, CAD, NSTEMI on 03/14/2019 status post PCI/DES to RCA and LAD who presents today for evaluation of body aches, cough, chills, and chest pain.  He reports that his pain feels like a tightness and is worse with coughing but does not completely go away when he is not coughing.  This is all been ongoing for 3 days.  He denies trouble breathing.  No nausea or vomiting.  No diaphoresis.  Patient Active Problem List   Diagnosis Date Noted   Dyspnea    HTN (hypertension) 03/19/2019   HLD (hyperlipidemia) 03/19/2019   Chest pain 03/18/2019   ACS (acute coronary syndrome) ([place]) 03/18/2019   Non-ST elevation (NSTEMI) myocardial infarction [facility])           Physical Exam   Triage Vital Signs: ED Triage Vitals  Enc Vitals Group     BP 05/08/22 1255 137/86     Pulse Rate 05/08/22 1255 80     Resp 05/08/22 1255 20     Temp 05/08/22 1255 98.3 F (36.8 C)     Temp Source 05/08/22 1255 Oral     SpO2 05/08/22 1255 94 %     Weight 05/08/22 1256 180 lb (81.6 kg)     Height --      Head Circumference --      Peak Flow --      Pain Score 05/08/22 1252 0     Pain Loc --      Pain Edu? --      Excl. in [place]? --     Most recent vital signs: Vitals:   05/08/22 1255  BP: 137/86  Pulse: 80  Resp: 20  Temp: 98.3 F (36.8 C)  SpO2: 94%    Physical Exam Vitals and nursing note reviewed.  Constitutional:      General: Awake and alert. No acute distress.    Appearance: Normal appearance. The patient is normal weight.  HENT:     Head: Normocephalic and atraumatic.     Mouth: Mucous membranes are moist.  Eyes:     General: PERRL. Normal EOMs        Right eye: No discharge.        Left eye: No discharge.      Conjunctiva/sclera: Conjunctivae normal.  Cardiovascular:     Rate and Rhythm: Normal rate and regular rhythm.     Pulses: Normal pulses.     Heart sounds: Normal heart sounds Pulmonary:     Effort: Pulmonary effort is normal. No respiratory distress.     Breath sounds: Normal breath sounds.  Able to speak easily in complete sentences Abdominal:     Abdomen is soft. There is no abdominal tenderness. No rebound or guarding. No distention. Musculoskeletal:        General: No swelling. Normal range of motion.     Cervical back: Normal range of motion and neck supple.  Skin:    General: Skin is warm and dry.     Capillary Refill: Capillary refill takes less than 2 seconds.     Findings: No rash.  Neurological:     Mental Status: The patient is awake and alert.      ED Results / Procedures /  Treatments   Labs (all labs ordered are listed, but only abnormal results are displayed) Labs Reviewed  RESP PANEL BY RT-PCR (RSV, FLU A&B, COVID)  RVPGX2 - Abnormal; Notable for the following components:      Result Value   Influenza A by PCR POSITIVE (*)    All other components within normal limits  BASIC METABOLIC PANEL - Abnormal; Notable for the following components:   Glucose, Bld 108 (*)    Calcium 8.4 (*)    All other components within normal limits  CBC WITH DIFFERENTIAL/PLATELET  TROPONIN I (HIGH SENSITIVITY)     EKG     RADIOLOGY I independently reviewed and interpreted imaging and agree with radiologists findings.     PROCEDURES:  Critical Care performed:   Procedures   MEDICATIONS ORDERED IN ED: Medications  acetaminophen (TYLENOL) tablet 650 mg (650 mg Oral Given 05/08/22 1432)     IMPRESSION / MDM / ASSESSMENT AND PLAN / ED COURSE  I reviewed the triage vital signs and the nursing notes.   Differential diagnosis includes, but is not limited to, COVID, influenza, bronchitis, pneumonia, acute coronary syndrome.  Patient is awake and alert, hemodynamically  stable and afebrile.  EKG reveals T wave inversions.  Swab obtained in triage is positive for influenza A.  Given that he has continued chest pain and had a history of acute coronary syndrome, he agreed to further workup.   Labs including troponin are negative.  No need for second troponin given the duration of symptoms.  Chest x-ray is negative for any acute findings.  We discussed his positive influenza result and he was given a work note.  We discussed strict return precautions and the importance of close outpatient follow-up.  He is out of the window of treatment with Tamiflu.  We discussed other symptomatic management.  Patient understands and agrees with plan.  He was discharged in stable condition.   Patient's presentation is most consistent with acute complicated illness / injury requiring diagnostic workup.       FINAL CLINICAL IMPRESSION(S) / ED DIAGNOSES   Final diagnoses:  Influenza A     Rx / DC Orders   ED Discharge Orders     None        Note:  This document was prepared using Dragon voice recognition software and may include unintentional dictation errors.   Emeline Gins 05/08/22 1626    Carrie Mew, MD 05/08/22 [phone]

## 2022-05-08 NOTE — ED Triage Notes (Signed)
Pt comes via EMs from home with c/o productive cough for few days. Pt states this am while he was coughing he had upper cp that got worse when coughing. Pt has been taking OTC meds.

## 2022-05-12 ENCOUNTER — Ambulatory Visit: Payer: Medicare Other | Admitting: Medical

## 2022-05-27 ENCOUNTER — Ambulatory Visit: Payer: Medicare Other | Admitting: Medical

## 2022-05-27 ENCOUNTER — Other Ambulatory Visit: Payer: Self-pay | Admitting: Cardiovascular Disease

## 2022-05-27 DIAGNOSIS — I2510 Atherosclerotic heart disease of native coronary artery without angina pectoris: Secondary | ICD-10-CM

## 2022-05-27 DIAGNOSIS — I10 Essential (primary) hypertension: Secondary | ICD-10-CM

## 2022-06-02 ENCOUNTER — Ambulatory Visit: Payer: Medicare Other | Admitting: Medical

## 2022-06-08 ENCOUNTER — Ambulatory Visit: Payer: Medicare Other | Attending: Medical | Admitting: Medical

## 2022-06-08 ENCOUNTER — Encounter: Payer: Self-pay | Admitting: Medical

## 2022-06-08 VITALS — BP 146/94 | HR 77 | Ht 63.0 in | Wt 186.0 lb

## 2022-06-08 DIAGNOSIS — I10 Essential (primary) hypertension: Secondary | ICD-10-CM

## 2022-06-08 DIAGNOSIS — I2510 Atherosclerotic heart disease of native coronary artery without angina pectoris: Secondary | ICD-10-CM | POA: Diagnosis not present

## 2022-06-08 DIAGNOSIS — I5032 Chronic diastolic (congestive) heart failure: Secondary | ICD-10-CM

## 2022-06-08 DIAGNOSIS — E782 Mixed hyperlipidemia: Secondary | ICD-10-CM | POA: Diagnosis not present

## 2022-06-08 DIAGNOSIS — Z87891 Personal history of nicotine dependence: Secondary | ICD-10-CM

## 2022-06-08 MED ORDER — CARVEDILOL 12.5 MG PO TABS: 12.5000 mg | ORAL_TABLET | Freq: Two times a day (BID) | ORAL | 5 refills | Status: DC

## 2022-06-08 NOTE — Progress Notes (Signed)
Cardiology Office Note:    Date:  06/08/2022   ID:  Jeff Rios, DOB [birth year], MRN [id number]  PCP:  Pcp, No  [facility] Cardiologist:  Kathlyn Sacramento, MD  [place] Electrophysiologist:  None   Referring MD: No ref. provider found   Chief Complaint: 6 month follow-up  History of Present Illness:    Jeff Rios is a 57 y.o. male with a hx of CAD (NSTEMI 03/2019 s/p DES to RCA and LAD), HTN, HLD, former tobacco abuse, idiopathic angioedema, who is pending colonoscopy, and presents today for 6 month follow-up.   He was admitted 03/2019 with inferior ST elevation myocardial infarction.  He reported severe substernal chest discomfort that woke him from sleep.  Cardiac catheterization showed significant 2v CAD with an occluded distal RCA and significant mLAD stenosis.  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  He underwent successful PCI and DES to the distal RCA with staged LAD PCI.     He had [place] study for SOB 05/2020 that was unrevealing and echo was stable. RVSP 28.6 with nl PASP, G1DD, mild MR, and mild LVH.    07/30/20 LHC performed with previously placed mLAD DES patent, rPDA 30%s, pRCA 30%s, non-stenotic dRCA previously treated and noted. No intervention performed with widely patent RCA and LAD stents without significant stenosis and no significant change since recent LHC. LV angiography not performed and echo noted to have nl EF. LVEDP moderately elevated with SBP elevated during LHC. Recommendation was for BP control and medical management.    He was seen 09/04/21 for pre-operative risk for colonoscopy. Echo was ordered for DOE, however it was not completed. He was seen back 11/2021 and echo was set up. Echo showed LVEF 60-65%, no WMA, moderate LVH, G1DD, normal RVSF, mild MR. He was deemed OK to undergo colonoscopy.   Today, the patient reports DOE. It's about the same as before. No chest pain, lower leg edema  or orthopnea. BP is mildly elevated. He takes Coreg, lisinopril and amlodipine. He is taking Aspirin and plavix.   Past Medical History:  Diagnosis Date   CAD (coronary artery disease)    HLD (hyperlipidemia)    Hypertension     Past Surgical History:  Procedure Laterality Date   CARDIAC CATHETERIZATION     CORONARY STENT INTERVENTION N/A 03/18/2019   Procedure: CORONARY STENT INTERVENTION;  Surgeon: Wellington Hampshire, MD;  Location: [place] [facility];  Service: Cardiovascular;  Laterality: N/A;   CORONARY STENT INTERVENTION N/A 03/20/2019   Procedure: CORONARY STENT INTERVENTION;  Surgeon: Wellington Hampshire, MD;  Location: [place] [facility];  Service: Cardiovascular;  Laterality: N/A;   CORONARY/GRAFT ACUTE MI REVASCULARIZATION N/A 03/18/2019   Procedure: Coronary/Graft Acute MI Revascularization;  Surgeon: Wellington Hampshire, MD;  Location: [place] [facility];  Service: Cardiovascular;  Laterality: N/A;   LEFT HEART CATH AND CORONARY ANGIOGRAPHY N/A 03/18/2019   Procedure: LEFT HEART CATH AND CORONARY ANGIOGRAPHY;  Surgeon: Wellington Hampshire, MD;  Location: [place] [facility];  Service: Cardiovascular;  Laterality: N/A;   LEFT HEART CATH AND CORONARY ANGIOGRAPHY N/A 07/29/2020   Procedure: LEFT HEART CATH AND CORONARY ANGIOGRAPHY;  Surgeon: Wellington Hampshire, MD;  Location: [place] [facility];  Service: Cardiovascular;  Laterality: N/A;    Current Medications: Current Meds  Medication Sig   Acetaminophen 500 MG capsule SMARTSIG:2 Capsule(s) By Mouth 3 Times Daily PRN   amLODipine (NORVASC) 10 MG tablet TAKE 1 TABLET BY MOUTH  ONCE DAILY.   ASPIRIN LOW DOSE 81 MG EC tablet Take 81 mg by mouth daily.   atorvastatin (LIPITOR) 80 MG tablet TAKE 1 TABLET BY MOUTH ONCE DAILY.   carvedilol (COREG) 12.5 MG tablet Take 1 tablet (12.5 mg total) by mouth 2 (two) times daily.   ezetimibe (ZETIA) 10 MG tablet TAKE 1 TABLET BY MOUTH ONCE DAILY.   lisinopril (ZESTRIL) 20 MG tablet TAKE 1  TABLET BY MOUTH ONCE DAILY.   nitroGLYCERIN (NITROSTAT) 0.4 MG SL tablet Place 1 tablet (0.4 mg total) under the tongue every 5 (five) minutes as needed for chest pain.   [DISCONTINUED] carvedilol (COREG) 6.25 MG tablet TAKE 1 TABLET BY MOUTH 2 TIMES A DAY WITH A MEAL.   [DISCONTINUED] clopidogrel (PLAVIX) 75 MG tablet TAKE 1 TABLET BY MOUTH ONCE DAILY.     Allergies:   Amlodipine and Imdur [isosorbide nitrate]   Social History   Socioeconomic History   Marital status: Single    Spouse name: Not on file   Number of children: Not on file   Years of education: Not on file   Highest education level: Not on file  Occupational History   Not on file  Tobacco Use   Smoking status: Former    Packs/day: 1.00    Years: 30.00    Total pack years: 30.00    Types: Cigarettes   Smokeless tobacco: Never  Vaping Use   Vaping Use: Never used  Substance and Sexual Activity   Alcohol use: No   Drug use: No   Sexual activity: Not on file  Other Topics Concern   Not on file  Social History Narrative   Not on file   Social Determinants of Health   Financial Resource Strain: Not on file  Food Insecurity: Not on file  Transportation Needs: Unmet Transportation Needs (06/08/2022)   PRAPARE - Hydrologist (Medical): Yes    Lack of Transportation (Non-Medical): Yes  Physical Activity: Not on file  Stress: Not on file  Social Connections: Not on file     Family History: The patient's Family history is unknown by patient.  ROS:   Please see the history of present illness.     All other systems reviewed and are negative.  EKGs/Labs/Other Studies Reviewed:    The following studies were reviewed today:    LHC 07/29/20 Previously placed Mid LAD drug eluting stent is widely patent. RPDA lesion is 30% stenosed. Prox RCA lesion is 30% stenosed. Non-stenotic Dist RCA lesion was previously treated. 1.  Widely patent RCA and LAD stents with no significant  restenosis.  No significant change from most recent cardiac catheterization. 2.  Left ventricular angiography was not performed.  EF was normal by echo. 3.  Left ventricular end-diastolic pressure was moderately elevated in the 20s range but his systemic blood pressure was elevated during cardiac catheterization. Recommendations: Continue medical therapy and blood pressure control. Coronary Diagrams     Diagnostic Dominance: Right       Echo 06/28/20  1. Left ventricular ejection fraction, by estimation, is 60 to 65%. The  left ventricle has normal function. The left ventricle has no regional  wall motion abnormalities. There is mild left ventricular hypertrophy.  Left ventricular diastolic parameters  are consistent with Grade I diastolic dysfunction (impaired relaxation).   2. Right ventricular systolic function is normal. The right ventricular  size is normal. There is normal pulmonary artery systolic pressure. The  estimated right ventricular systolic pressure  is 28.6 mmHg.   3. The mitral valve is normal in structure. Mild mitral valve  regurgitation.    NM Study 07/03/20 There was no ST segment deviation noted during stress. No T wave inversion was noted during stress. The study is normal. This is a low risk study. The left ventricular ejection fraction is hyperdynamic (>72%). There is no evidence for ischemia   PCI 03/20/2019 RPDA lesion is 40% stenosed. Prox RCA lesion is 30% stenosed. Previously placed Dist RCA drug eluting stent is widely patent. Balloon angioplasty was performed. Mid LAD lesion is 80% stenosed. Post intervention, there is a 0% residual stenosis. A drug-eluting stent was successfully placed using a STENT RESOLUTE ONYX 3.0X15. Successful angioplasty and drug-eluting stent placement to the mid LAD. Recommendations: Continue dual antiplatelet therapy for at least 1 year. Aggressive treatment of risk factors. Likely discharge home tomorrow. He is  to stay off work for 2 weeks.     Echo 03/18/19  1. Left ventricular ejection fraction, by visual estimation, is 60 to  65%. The left ventricle has normal function. There is mildly increased  left ventricular hypertrophy.   2. Left ventricular diastolic parameters are consistent with Grade I  diastolic dysfunction (impaired relaxation).   3. Global right ventricle has normal systolic function.The right  ventricular size is normal. No increase in right ventricular wall  thickness.   4. The mitral valve is normal in structure. No evidence of mitral valve  regurgitation. No evidence of mitral stenosis.   5. The tricuspid valve is normal in structure. Tricuspid valve  regurgitation is not demonstrated.   6. The aortic valve is normal in structure. Aortic valve regurgitation is  not visualized. No evidence of aortic valve sclerosis or stenosis.   7. The pulmonic valve was normal in structure. Pulmonic valve  regurgitation is trivial.   8. TR signal is inadequate for assessing pulmonary artery systolic  pressure.   9. The inferior vena cava is normal in size with greater than 50%  respiratory variability, suggesting right atrial pressure of 3 mmHg.    03/18/19 PCI The left ventricular systolic function is normal. LV end diastolic pressure is normal. The left ventricular ejection fraction is 55-65% by visual estimate. Mid LAD lesion is 80% stenosed. Prox RCA lesion is 30% stenosed. Dist RCA lesion is 100% stenosed. Post intervention, there is a 0% residual stenosis. A drug-eluting stent was successfully placed using a STENT RESOLUTE ONYX 2.25X22. RPDA lesion is 40% stenosed. 1.  Significant two-vessel coronary artery disease.  The culprit is an occluded distal right coronary artery with faint left-to-right collaterals.  The supplied area is not large and that is the likely reason for nondiagnostic EKG changes for inferior STEMI.  There is also significant mid LAD stenosis which appears  to be hazy. 2.  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  Significantly elevated LVEDP in the low 30s. 3.  Successful angioplasty and drug-eluting stent placement to the distal RCA. Recommendations: Continue dual antiplatelet therapy for at least 1 year.  Aggressive treatment of risk factors. Recommend staged PCI of the LAD before hospital discharge.  This is planned for Monday morning.      EKG:  EKG is ordered today.  The ekg ordered today demonstrates NSR, 77bpm, nonspecific T wave changes  Recent Labs: 05/08/2022: BUN 20; Creatinine, Ser 1.11; Hemoglobin 14.6; Platelets 181; Potassium 4.2; Sodium 136  Recent Lipid Panel  Component Value Date/Time   CHOL 99 (L) 04/07/2021 1105   TRIG 56 04/07/2021 1105   HDL 51 04/07/2021 1105   CHOLHDL 1.9 04/07/2021 1105   CHOLHDL 3.5 09/05/2019 1221   VLDL 23 09/05/2019 1221   LDLCALC 35 04/07/2021 1105   Physical Exam:    VS:  BP (!) 146/94 (BP Location: Left Arm, Patient Position: Sitting, Cuff Size: Large)   Pulse 77   Ht 5\' 3"  (1.6 m)   Wt 186 lb (84.4 kg)   SpO2 97%   BMI 32.95 kg/m     Wt Readings from Last 3 Encounters:  06/08/22 186 lb (84.4 kg)  05/08/22 179 lb 14.3 oz (81.6 kg)  01/12/22 180 lb (81.6 kg)     GEN:  Well nourished, well developed in no acute distress HEENT: Normal NECK: No JVD; No carotid bruits LYMPHATICS: No lymphadenopathy CARDIAC: RRR, no murmurs, rubs, gallops RESPIRATORY:  Clear to auscultation without rales, wheezing or rhonchi  ABDOMEN: Soft, non-tender, non-distended MUSCULOSKELETAL:  No edema; No deformity  SKIN: Warm and dry NEUROLOGIC:  Alert and oriented x 3 PSYCHIATRIC:  Normal affect   ASSESSMENT:    1. Coronary artery disease involving native coronary artery of native heart without angina pectoris   2. Chronic diastolic heart failure (HCC)   3. Essential  hypertension   4. Hyperlipidemia, mixed   5. History of tobacco use    PLAN:    In order of problems listed above:  CAD s/p DES to RCA and LAD in 2020 Most recent [facility] in 2022 showed previously placed mLAD DES patent, rPDA 30%s, pRCA 30%s, non-stenotic dRCA previously treated and noted. No intervention was  performed with widely patent RCA and LAD stents without significant stenosis and no significant change since the last LHC. The patient denies chest pain. He has stable DOE. I will stop plavix as it has been over 1 year of DAPT. Continue Aspirin, Lipitor 80mg  daily, Coreg zetia, ACEi, and SL NTG.   HFpEF The patient is euvolemic on exam today. Continue Coreg and lisinopril.   HTN BP is mildly increased. I will increase Coreg to 12.5mg  BID. Continue amlodipine 10mg  daily, lisinopril 20mg  daily.   HLD LDL 35. Continue Zetia and Lipitor.   Tobacco use  He quit smoking 2 years ago.  Disposition: Follow up in 6 month(s) with MD/APP    Signed, Chandel Zaun 2023, PA-C  06/08/2022 4:18 PM    [facility]

## 2022-06-08 NOTE — Patient Instructions (Signed)
Medication Instructions:  INCREASE carvedilol 12.5 mg by mouth twice a day D/C Plavix (clopidogrel)  *If you need a refill on your cardiac medications before your next appointment, please call your pharmacy*   Lab Work: No labs ordered  If you have labs (blood work) drawn today and your tests are completely normal, you will receive your results only by: [place] (if you have MyChart) OR A paper copy in the mail If you have any lab test that is abnormal or we need to change your treatment, we will call you to review the results.   Testing/Procedures: No testing ordered  Follow-Up: At [facility], you and your health needs are our priority.  As part of our continuing mission to provide you with exceptional heart care, we have created designated Provider Care Teams.  These Care Teams include your primary Cardiologist (physician) and Advanced Practice Providers (APPs -  Physician Assistants and Nurse Practitioners) who all work together to provide you with the care you need, when you need it.  We recommend signing up for the patient portal called "MyChart".  Sign up information is provided on this After Visit Summary.  MyChart is used to connect with patients for Virtual Visits (Telemedicine).  Patients are able to view lab/test results, encounter notes, upcoming appointments, etc.  Non-urgent messages can be sent to your provider as well.   To learn more about what you can do with MyChart, go to [email or web address].    Your next appointment:   6 month(s)  Provider:   You may see Kathlyn Sacramento, MD or one of the following Advanced Practice Providers on your designated Care Team:   Murray Hodgkins, NP Christell Faith, PA-C Cadence Kathlen Mody, PA-C Gerrie Nordmann, NP

## 2022-06-30 ENCOUNTER — Encounter: Payer: Self-pay | Admitting: Cardiovascular Disease

## 2022-06-30 ENCOUNTER — Other Ambulatory Visit: Payer: Self-pay | Admitting: Cardiovascular Disease

## 2022-06-30 DIAGNOSIS — I2510 Atherosclerotic heart disease of native coronary artery without angina pectoris: Secondary | ICD-10-CM

## 2022-06-30 DIAGNOSIS — I10 Essential (primary) hypertension: Secondary | ICD-10-CM

## 2022-06-30 NOTE — Telephone Encounter (Signed)
Error

## 2022-08-03 ENCOUNTER — Other Ambulatory Visit: Payer: Self-pay | Admitting: Cardiovascular Disease

## 2022-08-03 DIAGNOSIS — I10 Essential (primary) hypertension: Secondary | ICD-10-CM

## 2022-08-03 DIAGNOSIS — I2510 Atherosclerotic heart disease of native coronary artery without angina pectoris: Secondary | ICD-10-CM

## 2022-10-16 ENCOUNTER — Telehealth: Payer: Self-pay | Admitting: Cardiovascular Disease

## 2022-10-16 NOTE — Telephone Encounter (Signed)
Pt called back stating "everytime I take my blood pressure medication, it doesn't feel like I took them." Please advise

## 2022-10-16 NOTE — Telephone Encounter (Signed)
Returned call and spoke with patient. Patient states that he still does not feel like his blood pressure medications are working. Patient denies any new complaints or symptoms since previous conversation. Patient again encouraged to check blood pressure and keep a log. Patient also reminded that he has an appointment scheduled for 10/19/22. Patient instructed that he has any new or worsening symptoms that he can go to the Emergency Department. Patient verbalizes understanding.

## 2022-10-16 NOTE — Telephone Encounter (Addendum)
Called and spoke with patient. Patient states that his blood pressure has been elevated for about a week. Per patient  he has not checked his blood pressure but that he just does not feel right. Patient states that he can tell his blood pressure is elevated by the way he feels. Asked what symptoms he is having and says he just feels like his blood pressure is high and asked to be seen. Patient reports that he has been taking his medication as prescribed and that there has been no recent changes to his medication. Patient scheduled to see Cadence Furth, PA on 10/19/22. Patient encouraged to check blood pressures and keep a log prior to appointment.

## 2022-10-16 NOTE — Telephone Encounter (Signed)
Pt is requesting a callback to discuss his medications. He was unable to provide name of specific one he felt like wasn't working for him. Please advise

## 2022-10-19 ENCOUNTER — Ambulatory Visit: Payer: Medicare Other | Admitting: Medical

## 2022-10-22 ENCOUNTER — Telehealth: Payer: Self-pay | Admitting: Cardiovascular Disease

## 2022-10-22 NOTE — Progress Notes (Signed)
Cardiology Office Note:    Date:  10/23/2022   ID:  [name], DOB [birth year], MRN [id number]  PCP:  Pcp, No  [facility] Cardiologist:  Lorine Bears, MD  [facility] Electrophysiologist:  None   Referring MD: No ref. provider found   Chief Complaint: 4 month follow-up  History of Present Illness:    Jeff Rios is a 57 y.o. male with a hx of CAD (NSTEMI 03/2019 s/p DES to RCA and LAD), HTN, HLD, former tobacco abuse, idiopathic angioedema who presents today for 4 month follow-up.   He was admitted 03/2019 with inferior ST elevation myocardial infarction.  He reported severe substernal chest discomfort that woke him from sleep.  Cardiac catheterization showed significant 2v CAD with an occluded distal RCA and significant mLAD stenosis.  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  He underwent successful PCI and DES to the distal RCA with staged LAD PCI.     He had Myoview lexiscan study for SOB 05/2020 that was unrevealing and echo was stable. RVSP 28.6 with nl PASP, G1DD, mild MR, and mild LVH.    07/30/20 LHC performed with previously placed mLAD DES patent, rPDA 30%s, pRCA 30%s, non-stenotic dRCA previously treated and noted. No intervention performed with widely patent RCA and LAD stents without significant stenosis and no significant change since recent LHC. LV angiography not performed and echo noted to have nl EF. LVEDP moderately elevated with SBP elevated during LHC. Recommendation was for BP control and medical management.    He was seen 09/04/21 for pre-operative risk for colonoscopy. Echo was ordered for DOE, however it was not completed. He was seen back 11/2021 and echo was set up. Echo showed LVEF 60-65%, no WMA, moderate LVH, G1DD, normal RVSF, mild MR. He was deemed OK to undergo colonoscopy.   Last seen 06/08/22 and reported unchanged DOE. Plavix was stopped since it had been 1 year since stent.  Today, the patient  repots some lightheadedness and dizziness. He has not eaten this AM. He did take his medications today. BP is mildly elevated. Patient does not check his BP at home, but says it's been high. He denies chest pain or SOB. No lower leg edema, orthopnea or pnd.   Past Medical History:  Diagnosis Date   CAD (coronary artery disease)    HLD (hyperlipidemia)    Hypertension     Past Surgical History:  Procedure Laterality Date   CARDIAC CATHETERIZATION     CORONARY STENT INTERVENTION N/A 03/18/2019   Procedure: CORONARY STENT INTERVENTION;  Surgeon: Iran Ouch, MD;  Location: [facility] INVASIVE CV LAB;  Service: Cardiovascular;  Laterality: N/A;   CORONARY STENT INTERVENTION N/A 03/20/2019   Procedure: CORONARY STENT INTERVENTION;  Surgeon: Iran Ouch, MD;  Location: [facility] INVASIVE CV LAB;  Service: Cardiovascular;  Laterality: N/A;   CORONARY/GRAFT ACUTE MI REVASCULARIZATION N/A 03/18/2019   Procedure: Coronary/Graft Acute MI Revascularization;  Surgeon: Iran Ouch, MD;  Location: [facility] INVASIVE CV LAB;  Service: Cardiovascular;  Laterality: N/A;   LEFT HEART CATH AND CORONARY ANGIOGRAPHY N/A 03/18/2019   Procedure: LEFT HEART CATH AND CORONARY ANGIOGRAPHY;  Surgeon: Iran Ouch, MD;  Location: [facility] INVASIVE CV LAB;  Service: Cardiovascular;  Laterality: N/A;   LEFT HEART CATH AND CORONARY ANGIOGRAPHY N/A 07/29/2020   Procedure: LEFT HEART CATH AND CORONARY ANGIOGRAPHY;  Surgeon: Iran Ouch, MD;  Location: [facility] INVASIVE CV LAB;  Service: Cardiovascular;  Laterality: N/A;    Current Medications: Current  Meds  Medication Sig   Acetaminophen 500 MG capsule SMARTSIG:2 Capsule(s) By Mouth 3 Times Daily PRN   amLODipine (NORVASC) 10 MG tablet TAKE 1 TABLET BY MOUTH ONCE DAILY.   ASPIRIN LOW DOSE 81 MG EC tablet Take 81 mg by mouth daily.   atorvastatin (LIPITOR) 80 MG tablet TAKE 1 TABLET BY MOUTH ONCE DAILY.   carvedilol (COREG) 12.5 MG tablet Take 1 tablet (12.5 mg  total) by mouth 2 (two) times daily.   ezetimibe (ZETIA) 10 MG tablet TAKE 1 TABLET BY MOUTH ONCE DAILY.   lisinopril (ZESTRIL) 20 MG tablet TAKE 1 TABLET BY MOUTH ONCE DAILY.   nitroGLYCERIN (NITROSTAT) 0.4 MG SL tablet Place 1 tablet (0.4 mg total) under the tongue every 5 (five) minutes as needed for chest pain.     Allergies:   Amlodipine and Imdur [isosorbide nitrate]   Social History   Socioeconomic History   Marital status: Single    Spouse name: Not on file   Number of children: Not on file   Years of education: Not on file   Highest education level: Not on file  Occupational History   Not on file  Tobacco Use   Smoking status: Former    Packs/day: 1.00    Years: 30.00    Additional pack years: 0.00    Total pack years: 30.00    Types: Cigarettes   Smokeless tobacco: Never  Vaping Use   Vaping Use: Never used  Substance and Sexual Activity   Alcohol use: No   Drug use: No   Sexual activity: Not on file  Other Topics Concern   Not on file  Social History Narrative   Not on file   Social Determinants of Health   Financial Resource Strain: Not on file  Food Insecurity: Not on file  Transportation Needs: Unmet Transportation Needs (06/08/2022)   PRAPARE - Administrator, Civil Service (Medical): Yes    Lack of Transportation (Non-Medical): Yes  Physical Activity: Not on file  Stress: Not on file  Social Connections: Not on file     Family History: The patient's Family history is unknown by patient.  ROS:   Please see the history of present illness.     All other systems reviewed and are negative.  EKGs/Labs/Other Studies Reviewed:    The following studies were reviewed today:   LHC 07/29/20 Previously placed Mid LAD drug eluting stent is widely patent. RPDA lesion is 30% stenosed. Prox RCA lesion is 30% stenosed. Non-stenotic Dist RCA lesion was previously treated. 1.  Widely patent RCA and LAD stents with no significant restenosis.  No  significant change from most recent cardiac catheterization. 2.  Left ventricular angiography was not performed.  EF was normal by echo. 3.  Left ventricular end-diastolic pressure was moderately elevated in the 20s range but his systemic blood pressure was elevated during cardiac catheterization. Recommendations: Continue medical therapy and blood pressure control. Coronary Diagrams     Diagnostic Dominance: Right       Echo 06/28/20  1. Left ventricular ejection fraction, by estimation, is 60 to 65%. The  left ventricle has normal function. The left ventricle has no regional  wall motion abnormalities. There is mild left ventricular hypertrophy.  Left ventricular diastolic parameters  are consistent with Grade I diastolic dysfunction (impaired relaxation).   2. Right ventricular systolic function is normal. The right ventricular  size is normal. There is normal pulmonary artery systolic pressure. The  estimated right ventricular systolic  pressure is 28.6 mmHg.   3. The mitral valve is normal in structure. Mild mitral valve  regurgitation.    NM Study 07/03/20 There was no ST segment deviation noted during stress. No T wave inversion was noted during stress. The study is normal. This is a low risk study. The left ventricular ejection fraction is hyperdynamic (>72%). There is no evidence for ischemia   PCI 03/20/2019 RPDA lesion is 40% stenosed. Prox RCA lesion is 30% stenosed. Previously placed Dist RCA drug eluting stent is widely patent. Balloon angioplasty was performed. Mid LAD lesion is 80% stenosed. Post intervention, there is a 0% residual stenosis. A drug-eluting stent was successfully placed using a STENT RESOLUTE ONYX 3.0X15. Successful angioplasty and drug-eluting stent placement to the mid LAD. Recommendations: Continue dual antiplatelet therapy for at least 1 year. Aggressive treatment of risk factors. Likely discharge home tomorrow. He is to stay off work  for 2 weeks.     Echo 03/18/19  1. Left ventricular ejection fraction, by visual estimation, is 60 to  65%. The left ventricle has normal function. There is mildly increased  left ventricular hypertrophy.   2. Left ventricular diastolic parameters are consistent with Grade I  diastolic dysfunction (impaired relaxation).   3. Global right ventricle has normal systolic function.The right  ventricular size is normal. No increase in right ventricular wall  thickness.   4. The mitral valve is normal in structure. No evidence of mitral valve  regurgitation. No evidence of mitral stenosis.   5. The tricuspid valve is normal in structure. Tricuspid valve  regurgitation is not demonstrated.   6. The aortic valve is normal in structure. Aortic valve regurgitation is  not visualized. No evidence of aortic valve sclerosis or stenosis.   7. The pulmonic valve was normal in structure. Pulmonic valve  regurgitation is trivial.   8. TR signal is inadequate for assessing pulmonary artery systolic  pressure.   9. The inferior vena cava is normal in size with greater than 50%  respiratory variability, suggesting right atrial pressure of 3 mmHg.    03/18/19 PCI The left ventricular systolic function is normal. LV end diastolic pressure is normal. The left ventricular ejection fraction is 55-65% by visual estimate. Mid LAD lesion is 80% stenosed. Prox RCA lesion is 30% stenosed. Dist RCA lesion is 100% stenosed. Post intervention, there is a 0% residual stenosis. A drug-eluting stent was successfully placed using a STENT RESOLUTE ONYX 2.25X22. RPDA lesion is 40% stenosed. 1.  Significant two-vessel coronary artery disease.  The culprit is an occluded distal right coronary artery with faint left-to-right collaterals.  The supplied area is not large and that is the likely reason for nondiagnostic EKG changes for inferior STEMI.  There is also significant mid LAD stenosis which appears to be hazy. 2.   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  Significantly elevated LVEDP in the low 30s. 3.  Successful angioplasty and drug-eluting stent placement to the distal RCA. Recommendations: Continue dual antiplatelet therapy for at least 1 year.  Aggressive treatment of risk factors. Recommend staged PCI of the LAD before hospital discharge.  This is planned for Monday morning.    EKG:  EKG is not ordered today.    Recent Labs: 05/08/2022: BUN 20; Creatinine, Ser 1.11; Hemoglobin 14.6; Platelets 181; Potassium 4.2; Sodium 136  Recent Lipid Panel    Component Value Date/Time   CHOL 99 (L)  04/07/2021 1105   TRIG 56 04/07/2021 1105   HDL 51 04/07/2021 1105   CHOLHDL 1.9 04/07/2021 1105   CHOLHDL 3.5 09/05/2019 1221   VLDL 23 09/05/2019 1221   LDLCALC 35 04/07/2021 1105    Physical Exam:    VS:  BP (!) 142/91 (BP Location: Left Arm, Patient Position: Sitting, Cuff Size: Normal)   Pulse 65   Ht 5\' 3"  (1.6 m)   Wt 190 lb 6.4 oz (86.4 kg)   SpO2 96%   BMI 33.73 kg/m     Wt Readings from Last 3 Encounters:  10/23/22 190 lb 6.4 oz (86.4 kg)  06/08/22 186 lb (84.4 kg)  05/08/22 179 lb 14.3 oz (81.6 kg)     GEN:  Well nourished, well developed in no acute distress HEENT: Normal NECK: No JVD; No carotid bruits LYMPHATICS: No lymphadenopathy CARDIAC: RRR, no murmurs, rubs, gallops RESPIRATORY:  Clear to auscultation without rales, wheezing or rhonchi  ABDOMEN: Soft, non-tender, non-distended MUSCULOSKELETAL:  No edema; No deformity  SKIN: Warm and dry NEUROLOGIC:  Alert and oriented x 3 PSYCHIATRIC:  Normal affect   ASSESSMENT:    1. Coronary artery disease involving native coronary artery of native heart without angina pectoris   2. Chronic diastolic heart failure (HCC)   3. Essential hypertension   4. Hyperlipidemia, mixed    PLAN:    In order of problems  listed above:  CAD s/p DES to RCA and LAD in 2020 The patient denies anginal symptoms. LHC in 2022 showed previously placed mLAD stent patent, rPDA 30%, pRCA 30%, non-stenotic dRCA. No intervention was performed with widely patent RCA and LAD stents without significant stenosis and no significant changes since the last LHC. Continue Aspirin, Lipitor, Coreg, Zetia, Lisinopril and SL NTG. No ischemic work-up indicated.   HFpEF The patient is euvolemic on exam. Continue Coreg and Lisinopril.  HTN BP is mildly elevated again today, I will increase lisinopril to 40mg  daily. Continue amlodipine 10mg  daily and Coreg 12.5mg  BID.   HLD LDL 35 in 2022. Continue Lipitor and Zetia  Disposition: Follow up in 1-2 month(s) with MD/APP    Signed, Katena Petitjean David Stall, PA-C  10/23/2022 10:03 AM    [facility]

## 2022-10-22 NOTE — Telephone Encounter (Signed)
10/22/2022  Jeff Rios DOB: 07-29-1965 MRN: [id number]   RIDER WAIVER AND RELEASE OF LIABILITY  For the purposes of helping with transportation needs, [name] partners with outside transportation providers (taxi companies, [name], Catering manager.) to give [name] patients or other approved people the choice of on-demand rides [name]") to our buildings for non-emergency visits.  By using [employer], I, the person signing this document, on behalf of myself and/or any legal minors (in my care using the [employer]), agree:  Science writer given to me are supplied by independent, outside transportation providers who do not work for, or have any affiliation with, [name]. [place] is not a transportation company. [name] has no control over the quality or safety of the rides I get using [employer]. [name] has no control over whether any outside ride will happen on time or not. [name] gives no guarantee on the reliability, quality, safety, or availability on any rides, or that no mistakes will happen. I know and accept that traveling by vehicle (car, truck, SVU, [employer]ena[employer] Niece, bus, taxi, etc.) has risks of serious injuries such as disability, being paralyzed, and death. I know and agree the risk of using [employer] is mine alone, and not [name]. Transport Services are provided "as is" and as are available. The transportation providers are in charge for all inspections and care of the vehicles used to provide these rides. I agree not to take legal action against [name], its agents, employees, officers, directors, representatives, insurers, attorneys, assigns, successors, subsidiaries, and affiliates at any time for any reasons related directly or indirectly to using [employer]. I also agree not to take legal action against [place] or its affiliates for any injury, death, or damage to property caused by or related to using  [employer]. I have read this Waiver and Release of Liability, and I understand the terms used in it and their legal meaning. This Waiver is freely and voluntarily given with the understanding that my right (or any legal minors) to legal action against [name] relating to [employer] is knowingly given up to use these services.   I attest that I read the Ride Waiver and Release of Liability to Jeff Rios, gave [name] the opportunity to ask questions and answered the questions asked (if any). I affirm that Jeff Rios then provided consent for assistance with transportation.     Jeff Rios   [employer]/ [name] / Call [phone] when pt is done/  Will need to sign Transportation Wavier when he Checks in  Pt is aware he needs to be ready by 8:20 for pick for appt.

## 2022-10-23 ENCOUNTER — Encounter: Payer: Self-pay | Admitting: Medical

## 2022-10-23 ENCOUNTER — Ambulatory Visit: Payer: Medicare Other | Attending: Medical | Admitting: Medical

## 2022-10-23 VITALS — BP 142/91 | HR 65 | Ht 63.0 in | Wt 190.4 lb

## 2022-10-23 DIAGNOSIS — I5032 Chronic diastolic (congestive) heart failure: Secondary | ICD-10-CM | POA: Diagnosis not present

## 2022-10-23 DIAGNOSIS — I2510 Atherosclerotic heart disease of native coronary artery without angina pectoris: Secondary | ICD-10-CM | POA: Diagnosis not present

## 2022-10-23 DIAGNOSIS — I10 Essential (primary) hypertension: Secondary | ICD-10-CM | POA: Diagnosis not present

## 2022-10-23 DIAGNOSIS — E782 Mixed hyperlipidemia: Secondary | ICD-10-CM

## 2022-10-23 MED ORDER — BLOOD PRESSURE CUFF MISC: 1.0000 | Freq: Every day | 0 refills | Status: DC | PRN

## 2022-10-23 MED ORDER — LISINOPRIL 40 MG PO TABS: 40.0000 mg | ORAL_TABLET | Freq: Every day | ORAL | 3 refills | Status: DC

## 2022-10-23 NOTE — Patient Instructions (Signed)
Medication Instructions:  Increase your Lisinopril to 40 mg daily.    *If you need a refill on your cardiac medications before your next appointment, please call your pharmacy*   Lab Work: None ordered today.   If you have labs (blood work) drawn today and your tests are completely normal, you will receive your results only by: MyChart Message (if you have MyChart) OR A paper copy in the mail If you have any lab test that is abnormal or we need to change your treatment, we will call you to review the results.   Testing/Procedures: None ordered today.    Follow-Up: At [facility], you and your health needs are our priority.  As part of our continuing mission to provide you with exceptional heart care, we have created designated Provider Care Teams.  These Care Teams include your primary Cardiologist (physician) and Advanced Practice Providers (APPs -  Physician Assistants and Nurse Practitioners) who all work together to provide you with the care you need, when you need it.  We recommend signing up for the patient portal called "MyChart".  Sign up information is provided on this After Visit Summary.  MyChart is used to connect with patients for Virtual Visits (Telemedicine).  Patients are able to view lab/test results, encounter notes, upcoming appointments, etc.  Non-urgent messages can be sent to your provider as well.   To learn more about what you can do with MyChart, go to [email or web address].    Your next appointment:   1 -2 month(s)  Provider:   You may see Lorine Bears, MD or one of the following Advanced Practice Providers on your designated Care Team:   Nicolasa Ducking, NP Eula Listen, PA-C Cadence Fransico Michael, PA-C Charlsie Quest, NP

## 2022-10-25 ENCOUNTER — Emergency Department: Payer: Medicare Other

## 2022-10-25 ENCOUNTER — Other Ambulatory Visit: Payer: Self-pay

## 2022-10-25 ENCOUNTER — Encounter: Payer: Self-pay | Admitting: Emergency Medicine

## 2022-10-25 DIAGNOSIS — M62838 Other muscle spasm: Secondary | ICD-10-CM | POA: Diagnosis not present

## 2022-10-25 DIAGNOSIS — I2510 Atherosclerotic heart disease of native coronary artery without angina pectoris: Secondary | ICD-10-CM | POA: Insufficient documentation

## 2022-10-25 DIAGNOSIS — R002 Palpitations: Secondary | ICD-10-CM | POA: Diagnosis present

## 2022-10-25 DIAGNOSIS — I10 Essential (primary) hypertension: Secondary | ICD-10-CM | POA: Diagnosis not present

## 2022-10-25 LAB — CBC
HCT: 42 % (ref 39.0–52.0)
Hemoglobin: 15.1 g/dL (ref 13.0–17.0)
MCH: 28.9 pg (ref 26.0–34.0)
MCHC: 36 g/dL (ref 30.0–36.0)
MCV: 80.3 fL (ref 80.0–100.0)
Platelets: 259 10*3/uL (ref 150–400)
RBC: 5.23 MIL/uL (ref 4.22–5.81)
RDW: 13.5 % (ref 11.5–15.5)
WBC: 5.1 10*3/uL (ref 4.0–10.5)
nRBC: 0 % (ref 0.0–0.2)

## 2022-10-25 NOTE — ED Notes (Signed)
Lab called to add on TSH, T4 Free and Mag level.

## 2022-10-25 NOTE — ED Triage Notes (Signed)
First RN Note: pt to ED via [facility] from home. Per EMS pt called due to his BP being high and feeling palpitations. Per EMS pt intermittently takes BP meds in the morning and intermittently at night, took BP meds just prior to calling EMS.    162/102 96%  69HR

## 2022-10-26 ENCOUNTER — Emergency Department
Admission: EM | Admit: 2022-10-26 | Discharge: 2022-10-26 | Disposition: A | Discharge status: Home / Self Care | Payer: Medicare Other | Attending: Emergency Medicine | Admitting: Emergency Medicine

## 2022-10-26 DIAGNOSIS — M62838 Other muscle spasm: Secondary | ICD-10-CM

## 2022-10-26 LAB — BASIC METABOLIC PANEL
Anion gap: 8 (ref 5–15)
BUN: 10 mg/dL (ref 6–20)
CO2: 22 mmol/L (ref 22–32)
Calcium: 8.6 mg/dL — ABNORMAL LOW (ref 8.9–10.3)
Chloride: 106 mmol/L (ref 98–111)
Creatinine, Ser: 1.01 mg/dL (ref 0.61–1.24)
GFR, Estimated: >60 mL/min (ref >60)
Glucose, Bld: 101 mg/dL — ABNORMAL HIGH (ref 70–99)
Potassium: 3.9 mmol/L (ref 3.5–5.1)
Sodium: 136 mmol/L (ref 135–145)

## 2022-10-26 LAB — TROPONIN I (HIGH SENSITIVITY): Troponin I (High Sensitivity): 6 ng/L (ref <18)

## 2022-10-26 LAB — MAGNESIUM: Magnesium: 2.2 mg/dL (ref 1.7–2.4)

## 2022-10-26 LAB — TSH: TSH: 1.426 u[IU]/mL (ref 0.350–4.500)

## 2022-10-26 LAB — T4, FREE: Free T4: 0.84 ng/dL (ref 0.61–1.12)

## 2022-10-26 MED ORDER — BLOOD PRESSURE CUFF MISC
1.0000 | Freq: Every day | 0 refills | Status: AC | PRN
Start: 2022-10-26 — End: ?

## 2022-10-26 NOTE — ED Provider Notes (Signed)
[facility] Provider Note    Event Date/Time   First MD Initiated Contact with Patient 10/26/22 0117     (approximate)   History   Chief Complaint: Palpitation  HPI  Jeff Rios is a 57 y.o. male with a past history of hypertension and CAD who comes ED complaining of palpitations felt in the left upper chest that started around noon 10/25/2022.  Has been intermittent, lasting a few seconds at a time.  No chest pain or shortness of breath.     Physical Exam   Triage Vital Signs: ED Triage Vitals  Enc Vitals Group     BP 10/25/22 2312 (!) 140/100     Pulse Rate 10/25/22 2312 71     Resp 10/25/22 2312 20     Temp 10/25/22 2312 98.4 F (36.9 C)     Temp Source 10/25/22 2312 Oral     SpO2 10/25/22 2312 95 %     Weight --      Height --      Head Circumference --      Peak Flow --      Pain Score 10/25/22 2319 0     Pain Loc --      Pain Edu? --      Excl. in GC? --     Most recent vital signs: Vitals:   10/25/22 2312  BP: (!) 140/100  Pulse: 71  Resp: 20  Temp: 98.4 F (36.9 C)  SpO2: 95%    General: Awake, no distress.  CV:  Good peripheral perfusion.  Regular rate and rhythm.  No murmurs. Resp:  Normal effort.  Clear to auscultation bilaterally Abd:  No distention.  Other:  Muscle twitching in the pectoralis is felt during chest auscultation.  Patient confirms this recreates his symptoms.   ED Results / Procedures / Treatments   Labs (all labs ordered are listed, but only abnormal results are displayed) Labs Reviewed  BASIC METABOLIC PANEL - Abnormal; Notable for the following components:      Result Value   Glucose, Bld 101 (*)    Calcium 8.6 (*)    All other components within normal limits  CBC  MAGNESIUM  TSH  T4, FREE  TROPONIN I (HIGH SENSITIVITY)  TROPONIN I (HIGH SENSITIVITY)     EKG Interpreted by me Sinus rhythm rate of 67.  Normal axis, normal intervals.  Poor R wave progression.  Normal ST segments  and T waves.  No acute ischemic changes.  No ectopy.   RADIOLOGY Chest x-ray interpreted by me, appears normal.  Radiology report reviewed   PROCEDURES:  Procedures   MEDICATIONS ORDERED IN ED: Medications - No data to display   IMPRESSION / MDM / ASSESSMENT AND PLAN / ED COURSE  I reviewed the triage vital signs and the nursing notes.  DDx: Electrolyte abnormality, anemia, non-STEMI, muscle spasm  Patient's presentation is most consistent with acute presentation with potential threat to life or bodily function.  Patient presents with what he describes as palpitations.  Clinically this appears to be twitching and spasm of the chest wall left pectoralis.  With his comorbidities, labs and chest x-ray were obtained which are all normal.  Stable for discharge to follow-up with PCP.   Considering the patient's symptoms, medical history, and physical examination today, I have low suspicion for ACS, PE, TAD, pneumothorax, carditis, mediastinitis, pneumonia, CHF, or sepsis.        FINAL CLINICAL IMPRESSION(S) / ED DIAGNOSES   Final  diagnoses:  Muscle spasm     Rx / DC Orders   ED Discharge Orders          Ordered    Ambulatory Referral to Primary Care (Establish Care)  Status:  Canceled        10/26/22 0125             Note:  This document was prepared using Dragon voice recognition software and may include unintentional dictation errors.   Sharman Cheek, MD 10/26/22 [phone]

## 2022-10-26 NOTE — Addendum Note (Signed)
Addended by: Parke Poisson on: 10/26/2022 08:58 AM   Modules accepted: Orders

## 2022-10-29 ENCOUNTER — Telehealth: Payer: Self-pay | Admitting: Medical

## 2022-10-29 MED ORDER — AMLODIPINE BESYLATE 10 MG PO TABS: 10.0000 mg | ORAL_TABLET | Freq: Every day | ORAL | 3 refills | Status: DC

## 2022-10-29 MED ORDER — CARVEDILOL 12.5 MG PO TABS: 12.5000 mg | ORAL_TABLET | Freq: Two times a day (BID) | ORAL | 3 refills | Status: DC

## 2022-10-29 NOTE — Telephone Encounter (Signed)
*  STAT* If patient is at the pharmacy, call can be transferred to refill team.   1. Which medications need to be refilled? (please list name of each medication and dose if known)  amLODipine (NORVASC) 10 MG tablet   carvedilol (COREG) 12.5 MG tablet   .2. Which pharmacy/location (including street and city if local pharmacy) is medication to be sent to? [facility] - [place], [place] - [address]    3. Do they need a 30 day or 90 day supply? 30

## 2022-11-13 ENCOUNTER — Other Ambulatory Visit: Payer: Self-pay

## 2022-11-13 DIAGNOSIS — I2510 Atherosclerotic heart disease of native coronary artery without angina pectoris: Secondary | ICD-10-CM

## 2022-11-13 MED ORDER — ATORVASTATIN CALCIUM 80 MG PO TABS
80.0000 mg | ORAL_TABLET | Freq: Every day | ORAL | 1 refills | Status: DC
Start: 2022-11-13 — End: 2023-01-15

## 2022-12-30 ENCOUNTER — Ambulatory Visit: Payer: Medicare Other | Admitting: Nurse Practitioner

## 2023-01-14 ENCOUNTER — Other Ambulatory Visit: Payer: Self-pay | Admitting: Cardiovascular Disease

## 2023-01-14 DIAGNOSIS — I2510 Atherosclerotic heart disease of native coronary artery without angina pectoris: Secondary | ICD-10-CM

## 2023-02-10 ENCOUNTER — Other Ambulatory Visit: Payer: Self-pay | Admitting: Cardiovascular Disease

## 2023-02-10 DIAGNOSIS — I2510 Atherosclerotic heart disease of native coronary artery without angina pectoris: Secondary | ICD-10-CM

## 2023-02-12 ENCOUNTER — Ambulatory Visit: Payer: Medicare Other | Admitting: Nurse Practitioner

## 2023-02-16 ENCOUNTER — Ambulatory Visit: Payer: Medicare Other | Attending: Nurse Practitioner | Admitting: Physician Assistant

## 2023-02-16 NOTE — Progress Notes (Deleted)
Cardiology Office Note    Date:  02/16/2023   ID:  Jeff Rios, DOB Jun 19, 1965, MRN [id number]  PCP:  Pcp, No  Cardiologist:  Lorine Bears, MD  Electrophysiologist:  None   Chief Complaint: ***  History of Present Illness:   Jeff Rios is a 57 y.o. 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  LV angiography was suggestive of hypertrophic cardiomyopathy with mid cavity gradient.  He underwent successful PCI/DES placement to the distal RCA and staged LAD PCI.  Echo during the admission showed an EF of 60 to 65% with no significant valvular abnormalities.  Echo in 06/2020 showed an EF of 60 to 65%, no regional wall motion abnormalities, mild LVH, grade 1 diastolic dysfunction, normal RV systolic function and ventricular cavity size, normal RVSP, and mild mitral regurgitation.  Lexiscan MPI in 07/2020, performed for exertional dyspnea, showed no evidence of ischemia with an EF of greater than 72% and was overall low risk.  LHC in 07/2020, undertaken for persistent exertional dyspnea, showed widely patent RCA and LAD stents with no significant restenosis.  There was 30% proximal RCA stenosis and 30% RPDA stenosis.  There was no significant change from prior cardiac catheterization.  LVEDP was moderately elevated with elevated systemic blood pressure during cath.  Medical therapy was recommended.  Most recent echo from 11/2021 showed an EF of 60 to 65%, no regional wall motion abnormalities, moderate LVH, grade 1 diastolic dysfunction, normal RV systolic function  and ventricular cavity size, mild mitral regurgitation, and an estimated right atrial pressure of 3 mmHg.  He has continued to note intermittent exertional dyspnea and follow-up visits.  He was most recently seen in 10/2022 noting some lightheadedness and dizziness, though was without symptoms of angina or cardiac decompensation.  ***   Labs independently reviewed: 10/2022 - TSH normal, magnesium 2.2, Hgb 15.1, PLT 259, potassium 3.9, BUN 10, serum creatinine 1.01 08/2022 - albumin 4.1, AST/ALT normal, TC 170, TG 57, HDL 58, LDL 46, A1c 5.9   Past Medical History:  Diagnosis Date   CAD (coronary artery disease)    HLD (hyperlipidemia)    Hypertension     Past Surgical History:  Procedure Laterality Date   CARDIAC CATHETERIZATION     CORONARY STENT INTERVENTION N/A 03/18/2019   Procedure: CORONARY STENT INTERVENTION;  Surgeon: Iran Ouch, MD;  Location: [facility];  Service: Cardiovascular;  Laterality: N/A;   CORONARY STENT INTERVENTION N/A 03/20/2019   Procedure: CORONARY STENT INTERVENTION;  Surgeon: Iran Ouch, MD;  Location: [facility];  Service: Cardiovascular;  Laterality: N/A;   CORONARY/GRAFT ACUTE MI REVASCULARIZATION N/A 03/18/2019   Procedure: Coronary/Graft Acute MI Revascularization;  Surgeon: Iran Ouch, MD;  Location: [facility];  Service: Cardiovascular;  Laterality: N/A;   LEFT HEART CATH AND CORONARY ANGIOGRAPHY N/A 03/18/2019   Procedure: LEFT HEART CATH AND CORONARY ANGIOGRAPHY;  Surgeon: Iran Ouch, MD;  Location: [facility];  Service: Cardiovascular;  Laterality: N/A;   LEFT HEART CATH AND CORONARY ANGIOGRAPHY N/A 07/29/2020   Procedure: LEFT  HEART CATH AND CORONARY ANGIOGRAPHY;  Surgeon: Iran Ouch, MD;  Location: [facility];  Service: Cardiovascular;  Laterality: N/A;    Current Medications: No outpatient medications have been marked as taking for the 02/16/23 encounter  (Appointment) with Sondra Barges, PA-C.    Allergies:   Amlodipine and Imdur [isosorbide nitrate]   Social History   Socioeconomic History   Marital status: Single    Spouse name: Not on file   Number of children: Not on file   Years of education: Not on file   Highest education level: Not on file  Occupational History   Not on file  Tobacco Use   Smoking status: Former    Current packs/day: 1.00    Average packs/day: 1 pack/day for 30.0 years (30.0 ttl pk-yrs)    Types: Cigarettes   Smokeless tobacco: Never  Vaping Use   Vaping status: Never Used  Substance and Sexual Activity   Alcohol use: No   Drug use: No   Sexual activity: Not on file  Other Topics Concern   Not on file  Social History Narrative   Not on file   Social Determinants of Health   Financial Resource Strain: Not on file  Food Insecurity: Not on file  Transportation Needs: Unmet Transportation Needs (06/08/2022)   PRAPARE - Administrator, Civil Service (Medical): Yes    Lack of Transportation (Non-Medical): Yes  Physical Activity: Not on file  Stress: Not on file  Social Connections: Not on file     Family History:  The patient's Family history is unknown by patient.  ROS:   12-point review of systems is negative unless otherwise noted in the HPI.   EKGs/Labs/Other Studies Reviewed:    Studies reviewed were summarized above. The additional studies were reviewed today:  LHC 03/18/2019: The left ventricular systolic function is normal. LV end diastolic pressure is normal. The left ventricular ejection fraction is 55-65% by visual estimate. Mid LAD lesion is 80% stenosed. Prox RCA lesion is 30% stenosed. Dist RCA lesion is 100% stenosed. Post intervention, there is a 0% residual stenosis. A drug-eluting stent was successfully placed using a STENT RESOLUTE ONYX 2.25X22. RPDA lesion is 40% stenosed.   1.  Significant two-vessel coronary artery disease.  The culprit is an occluded  distal right coronary artery with faint left-to-right collaterals.  The supplied area is not large and that is the likely reason for nondiagnostic EKG changes for inferior STEMI.  There is also significant mid LAD stenosis which appears to be hazy. 2.  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  Significantly elevated LVEDP in the low 30s. 3.  Successful angioplasty and drug-eluting stent placement to the distal RCA.   Recommendations: Continue dual antiplatelet therapy for at least 1 year.  Aggressive treatment of risk factors. Recommend staged PCI of the LAD before hospital discharge.  This is planned for Monday morning. __________  2D echo 03/18/2019: 1. Left ventricular ejection fraction, by visual estimation, is 60 to  65%. The left ventricle has normal function. There is mildly increased  left ventricular hypertrophy.   2. Left ventricular diastolic parameters are consistent with Grade I  diastolic dysfunction (impaired relaxation).   3. Global right ventricle has normal systolic function.The right  ventricular size is normal. No increase in right ventricular wall  thickness.   4. The mitral valve is normal in structure. No  evidence of mitral valve  regurgitation. No evidence of mitral stenosis.   5. The tricuspid valve is normal in structure. Tricuspid valve  regurgitation is not demonstrated.   6. The aortic valve is normal in structure. Aortic valve regurgitation is  not visualized. No evidence of aortic valve sclerosis or stenosis.   7. The pulmonic valve was normal in structure. Pulmonic valve  regurgitation is trivial.   8. TR signal is inadequate for assessing pulmonary artery systolic  pressure.   9. The inferior vena cava is normal in size with greater than 50%  respiratory variability, suggesting right atrial pressure of 3 mmHg.  __________  LHC  03/20/2019: RPDA lesion is 40% stenosed. Prox RCA lesion is 30% stenosed. Previously placed Dist RCA drug eluting stent is widely patent. Balloon angioplasty was performed. Mid LAD lesion is 80% stenosed. Post intervention, there is a 0% residual stenosis. A drug-eluting stent was successfully placed using a STENT RESOLUTE ONYX 3.0X15.   Successful angioplasty and drug-eluting stent placement to the mid LAD.   Recommendations: Continue dual antiplatelet therapy for at least 1 year. Aggressive treatment of risk factors. Likely discharge home tomorrow. He is to stay off work for 2 weeks. __________  2D echo 06/28/2020: 1. Left ventricular ejection fraction, by estimation, is 60 to 65%. The  left ventricle has normal function. The left ventricle has no regional  wall motion abnormalities. There is mild left ventricular hypertrophy.  Left ventricular diastolic parameters  are consistent with Grade I diastolic dysfunction (impaired relaxation).   2. Right ventricular systolic function is normal. The right ventricular  size is normal. There is normal pulmonary artery systolic pressure. The  estimated right ventricular systolic pressure is 28.6 mmHg.   3. The mitral valve is normal in structure. Mild mitral valve  regurgitation.  __________  [facility] MPI 07/03/2020: There was no ST segment deviation noted during stress. No T wave inversion was noted during stress. The study is normal. This is a low risk study. The left ventricular ejection fraction is hyperdynamic (>72%). There is no evidence for ischemia __________  [facility] 07/29/2020: Previously placed Mid LAD drug eluting stent is widely patent. RPDA lesion is 30% stenosed. Prox RCA lesion is 30% stenosed. Non-stenotic Dist RCA lesion was previously treated.   1.  Widely patent RCA and LAD stents with no significant restenosis.  No significant change from most recent cardiac catheterization. 2.  Left ventricular angiography was not  performed.  EF was normal by echo. 3.  Left ventricular end-diastolic pressure was moderately elevated in the 20s range but his systemic blood pressure was elevated during cardiac catheterization.   Recommendations: Continue medical therapy and blood pressure control. __________  2D echo 11/28/2021: 1. Left ventricular ejection fraction, by estimation, is 60 to 65%. The  left ventricle has normal function. The left ventricle has no regional  wall motion abnormalities. There is moderate left ventricular hypertrophy.  Left ventricular diastolic  parameters are consistent with Grade I diastolic dysfunction (impaired  relaxation).   2. Right ventricular systolic function is normal. The right ventricular  size is normal. Tricuspid regurgitation signal is inadequate for assessing  PA pressure.   3. The mitral valve is normal in structure. Mild mitral valve  regurgitation. No evidence of mitral stenosis.   4. The aortic valve was not well visualized. Aortic valve regurgitation  is not visualized. No aortic stenosis is present.   5. The inferior vena cava is normal in size with greater than 50%  respiratory variability,  suggesting right atrial pressure of 3 mmHg.    EKG:  EKG is ordered today.  The EKG ordered today demonstrates ***  Recent Labs: 10/25/2022: BUN 10; Creatinine, Ser 1.01; Hemoglobin 15.1; Magnesium 2.2; Platelets 259; Potassium 3.9; Sodium 136; TSH 1.426  Recent Lipid Panel    Component Value Date/Time   CHOL 99 (L) 04/07/2021 1105   TRIG 56 04/07/2021 1105   HDL 51 04/07/2021 1105   CHOLHDL 1.9 04/07/2021 1105   CHOLHDL 3.5 09/05/2019 1221   VLDL 23 09/05/2019 1221   LDLCALC 35 04/07/2021 1105    PHYSICAL EXAM:    VS:  There were no vitals taken for this visit.  BMI: There is no height or weight on file to calculate BMI.  Physical Exam  Wt Readings from Last 3 Encounters:  10/23/22 190 lb 6.4 oz (86.4 kg)  06/08/22 186 lb (84.4 kg)  05/08/22 179 lb 14.3 oz  (81.6 kg)     ASSESSMENT & PLAN:   CAD involving the native coronary arteries with ***:  Diastolic dysfunction:  HTN: Blood pressure  HLD: LDL 46 in 08/2022.   {Are you ordering a CV Procedure (e.g. stress test, cath, DCCV, TEE, etc)?   Press F2        :[id number]}     Disposition: F/u with Dr. Kirke Corin or an APP in ***.   Medication Adjustments/Labs and Tests Ordered: Current medicines are reviewed at length with the patient today.  Concerns regarding medicines are outlined above. Medication changes, Labs and Tests ordered today are summarized above and listed in the Patient Instructions accessible in Encounters.   Signed, Eula Listen, PA-C 02/16/2023 7:17 AM     [facility] - Page [address] [place], [place] [address] [phone]

## 2023-03-11 ENCOUNTER — Other Ambulatory Visit: Payer: Self-pay | Admitting: Cardiovascular Disease

## 2023-03-11 DIAGNOSIS — I2510 Atherosclerotic heart disease of native coronary artery without angina pectoris: Secondary | ICD-10-CM

## 2023-05-19 ENCOUNTER — Other Ambulatory Visit: Payer: Self-pay | Admitting: Cardiovascular Disease

## 2023-06-17 ENCOUNTER — Other Ambulatory Visit: Payer: Self-pay | Admitting: Cardiovascular Disease

## 2023-06-17 ENCOUNTER — Telehealth: Payer: Self-pay | Admitting: Cardiovascular Disease

## 2023-06-17 ENCOUNTER — Encounter: Payer: Self-pay | Admitting: Cardiovascular Disease

## 2023-06-17 DIAGNOSIS — I2510 Atherosclerotic heart disease of native coronary artery without angina pectoris: Secondary | ICD-10-CM

## 2023-06-17 NOTE — Telephone Encounter (Signed)
Unable to reach letter sent to patient via mail

## 2023-06-17 NOTE — Telephone Encounter (Signed)
Unable to leave voicemail due to mailbox being full. Pt needs overdue follow up appt and for med refills

## 2023-06-17 NOTE — Telephone Encounter (Signed)
Unable to leave voicemail due to mailbox being full, unable to reach letter sent via mail

## 2023-06-17 NOTE — Telephone Encounter (Signed)
Good Morning,  Could you schedule this patient an overdue follow up appointment? The patient was last seen by C. Furth on 10-23-2022. Thank you so much.

## 2023-09-28 ENCOUNTER — Ambulatory Visit: Admitting: Cardiovascular Disease

## 2023-10-06 ENCOUNTER — Ambulatory Visit: Admitting: Nurse Practitioner

## 2023-10-07 ENCOUNTER — Ambulatory Visit: Attending: Cardiology | Admitting: Cardiology

## 2023-10-07 ENCOUNTER — Encounter: Payer: Self-pay | Admitting: Cardiology

## 2023-10-07 VITALS — BP 114/78 | HR 61 | Ht 63.0 in | Wt 187.6 lb

## 2023-10-07 DIAGNOSIS — I739 Peripheral vascular disease, unspecified: Secondary | ICD-10-CM

## 2023-10-07 DIAGNOSIS — I5032 Chronic diastolic (congestive) heart failure: Secondary | ICD-10-CM

## 2023-10-07 DIAGNOSIS — E782 Mixed hyperlipidemia: Secondary | ICD-10-CM | POA: Diagnosis not present

## 2023-10-07 DIAGNOSIS — I10 Essential (primary) hypertension: Secondary | ICD-10-CM | POA: Diagnosis not present

## 2023-10-07 DIAGNOSIS — I2510 Atherosclerotic heart disease of native coronary artery without angina pectoris: Secondary | ICD-10-CM | POA: Diagnosis not present

## 2023-10-07 MED ORDER — BLOOD PRESSURE CUFF MISC
0 refills | Status: AC
Start: 2023-10-07 — End: ?

## 2023-10-07 NOTE — Progress Notes (Signed)
 Cardiology Office Note   Date:  10/07/2023  ID:  Raiyan Dalesandro, DOB 12/27/1965, MRN [id number] PCP: Pcp, No  [facility] Providers Cardiologist:  Antionette Kirks, MD Cardiology APP:  Henrene Locust, PA-C     History of Present Illness Marcellis Frampton is a 58 y.o. male with a past medical history of coronary artery disease status post NSTEMI (03/2019) status post DES to the RCA and LAD, hypertension, hyperlipidemia, former tobacco abuse, idiopathic angioedema, who presents today for follow-up.   Previously been admitted in 03/2019 with inferior ST elevation myocardial infarction.  Reported severe substernal chest discomfort that woke him from sleep.  Cardiac catheterization showed significant two-vessel coronary artery disease with an occluded distal RCA and significant M LAD stenosis.  LV angiogram was suggestive of hypertrophic cardiomyopathy with mild cavity gradient.  He underwent successful PCI/DES to the distal RCA and staged LAD PCI.  Underwent Myoview  Lexiscan  study for shortness of breath in 05/2020 that was unrevealing.  His echo remained stable.  Repeat left heart catheterization in 07/30/2020 revealed previously placed mLAD DES stent was patent, RPDA 30% stenosis, P RCA 30% stenosis, nonstenotic distal RCA previously treated and noted.  No intervention performed widely patent RCA and LAD stents without significant in-stent restenosis and no significant change since recent left heart catheterization.  LV angiography was not performed echo noted to have normal EF.  LVEDP moderately elevated with systolic blood pressure elevated during left heart catheterization.  He was seen 09/04/2021 for preoperative risk for colonoscopy.  Echocardiogram was ordered for DOE however was not completed.  He was then again seen back in clinic 11/2021.  Echo revealed an LVEF of 60 to 60%, no RWMA, moderate LVH, G1 DD, normal RV SF, mild MR, he was deemed okay to undergo colonoscopy.  06/08/2022  reported unchanged DOE clopidogrel  was stopped since then been a year since stent placement.   He was last seen in clinic 10/23/2022.  Reported some lightheadedness and dizziness.  There were no medication changes were made and no further testing that was ordered at that time.  He returns to clinic today stating for the most part he has been doing fairly well.  Denies any chest pain, shortness of breath, dyspnea exertion, lightheadedness/dizziness, near syncope.  He states that he does have pain to his bilateral lower extremities when he is walking that starts in the bottom of his feet and moves up his legs.  He says it is random and it happens at various times.  He has been unable to pinpoint aggravating or alleviating factors.  He states that he has been compliant with his current medication regimen with no adverse effects.  Recently had labs done by his PCP.  Denies any hospitalizations or visits to the emergency department.  ROS: 10 point review of system has been reviewed and considered negative except what is listed in the HPI  Studies Reviewed EKG Interpretation Date/Time:  Thursday October 07 2023 08:52:49 EDT Ventricular Rate:  61 PR Interval:  160 QRS Duration:  86 QT Interval:  396 QTC Calculation: 398 R Axis:   82  Text Interpretation: Normal sinus rhythm Abnormal QRS-T angle, consider primary T wave abnormality When compared with ECG of 25-Oct-2022 23:12, Questionable change in QRS axis Nonspecific T wave abnormality now evident in Inferior leads T wave inversion less evident in Lateral leads No significant change since last tracing Confirmed by Ronald Cockayne ([id number]) on 10/07/2023 8:58:42 AM    LHC 07/29/20 Previously placed Mid LAD drug eluting  stent is widely patent. RPDA lesion is 30% stenosed. Prox RCA lesion is 30% stenosed. Non-stenotic Dist RCA lesion was previously treated. 1.  Widely patent RCA and LAD stents with no significant restenosis.  No significant change from most  recent cardiac catheterization. 2.  Left ventricular angiography was not performed.  EF was normal by echo. 3.  Left ventricular end-diastolic pressure was moderately elevated in the 20s range but his systemic blood pressure was elevated during cardiac catheterization. Recommendations: Continue medical therapy and blood pressure control. Coronary Diagrams     Diagnostic Dominance: Right       Echo 06/28/20  1. Left ventricular ejection fraction, by estimation, is 60 to 65%. The  left ventricle has normal function. The left ventricle has no regional  wall motion abnormalities. There is mild left ventricular hypertrophy.  Left ventricular diastolic parameters  are consistent with Grade I diastolic dysfunction (impaired relaxation).   2. Right ventricular systolic function is normal. The right ventricular  size is normal. There is normal pulmonary artery systolic pressure. The  estimated right ventricular systolic pressure is 28.6 mmHg.   3. The mitral valve is normal in structure. Mild mitral valve  regurgitation.    NM Study 07/03/20 There was no ST segment deviation noted during stress. No T wave inversion was noted during stress. The study is normal. This is a low risk study. The left ventricular ejection fraction is hyperdynamic (>72%). There is no evidence for ischemia   PCI 03/20/2019 RPDA lesion is 40% stenosed. Prox RCA lesion is 30% stenosed. Previously placed Dist RCA drug eluting stent is widely patent. Balloon angioplasty was performed. Mid LAD lesion is 80% stenosed. Post intervention, there is a 0% residual stenosis. A drug-eluting stent was successfully placed using a STENT RESOLUTE ONYX 3.0X15. Successful angioplasty and drug-eluting stent placement to the mid LAD. Recommendations: Continue dual antiplatelet therapy for at least 1 year. Aggressive treatment of risk factors. Likely discharge home tomorrow. He is to stay off work for 2 weeks.     Echo 03/18/19  1. Left ventricular ejection fraction, by visual estimation, is 60 to  65%. The left ventricle has normal function. There is mildly increased  left ventricular hypertrophy.   2. Left ventricular diastolic parameters are consistent with Grade I  diastolic dysfunction (impaired relaxation).   3. Global right ventricle has normal systolic function.The right  ventricular size is normal. No increase in right ventricular wall  thickness.   4. The mitral valve is normal in structure. No evidence of mitral valve  regurgitation. No evidence of mitral stenosis.   5. The tricuspid valve is normal in structure. Tricuspid valve  regurgitation is not demonstrated.   6. The aortic valve is normal in structure. Aortic valve regurgitation is  not visualized. No evidence of aortic valve sclerosis or stenosis.   7. The pulmonic valve was normal in structure. Pulmonic valve  regurgitation is trivial.   8. TR signal is inadequate for assessing pulmonary artery systolic  pressure.   9. The inferior vena cava is normal in size with greater than 50%  respiratory variability, suggesting right atrial pressure of 3 mmHg.    03/18/19 PCI The left ventricular systolic function is normal. LV end diastolic pressure is normal. The left ventricular ejection fraction is 55-65% by visual estimate. Mid LAD lesion is 80% stenosed. Prox RCA lesion is 30% stenosed. Dist RCA lesion is 100% stenosed. Post intervention, there is a 0% residual stenosis. A drug-eluting stent was successfully placed using a  STENT RESOLUTE ONYX [id number]. RPDA lesion is 40% stenosed. 1.  Significant two-vessel coronary artery disease.  The culprit is an occluded distal right coronary artery with faint left-to-right collaterals.  The supplied area is not large and that is the likely reason for nondiagnostic EKG changes for inferior STEMI.  There is also significant mid LAD stenosis which appears to be hazy. 2.  Normal LV systolic  function with Spade-like appearance of the left ventricle and mid cavity gradient suggestive of variant form of hypertrophic cardiomyopathy.  Significantly elevated LVEDP in the low 30s. 3.  Successful angioplasty and drug-eluting stent placement to the distal RCA. Recommendations: Continue dual antiplatelet therapy for at least 1 year.  Aggressive treatment of risk factors. Recommend staged PCI of the LAD before hospital discharge.  This is planned for Monday morning.   Risk Assessment/Calculations           Physical Exam VS:  BP 114/78 (BP Location: Left Arm)   Pulse 61   Ht 5\' 3"  (1.6 m)   Wt 187 lb 9.6 oz (85.1 kg)   SpO2 95%   BMI 33.23 kg/m    Wt Readings from Last 3 Encounters:  10/07/23 187 lb 9.6 oz (85.1 kg)  10/23/22 190 lb 6.4 oz (86.4 kg)  06/08/22 186 lb (84.4 kg)    GEN: Well nourished, well developed in no acute distress NECK: No JVD; No carotid bruits CARDIAC: RRR, no murmurs, rubs, gallops RESPIRATORY:  Clear to auscultation without rales, wheezing or rhonchi  ABDOMEN: Soft, non-tender, non-distended EXTREMITIES:  No edema; No deformity, 2+ palpable pulses posterior tibial  ASSESSMENT AND PLAN Coronary disease status post DES to RCA mid LAD in 2020.  Left heart catheterization in 2022 showed previously placed mid LAD stent was patent, RPDA 30%, PDA RCA 30%, nonstenotic dRCA.  No intervention was performed to follow.  RCA and LAD stents without significant stenosis and no significant changes since last heart catheterization.  He continues to deny angina or anginal equivalents.  EKG today reveals sinus rhythm with rate of 61 with nonspecific changes in T waves.  He has been continued on aspirin  81 mg daily, atorvastatin  80 mg daily, carvedilol  12.5 mg twice daily, ezetimibe  10 mg daily, and sublingual Nitrostat  as needed.  No further ischemic workup is needed at this time.  Chronic HFpEF with a last LVEF of 60 to 65% in 06/2020.  Patient denies any shortness of breath.   Appears to be euvolemic on exam.  Continues to suffer from NYHA class I-II symptoms.  Has been continued on carvedilol  12.5 mg twice daily and lisinopril  40 mg daily.  Primary hypertension with blood pressure today is well-controlled 114/70.  He is continued on carvedilol  and clonidine .  He is also being sent in for a blood pressure cuff to monitor his blood pressure at home 1 to 2 hours postmedication administration at home as well.  Mixed hyperlipidemia with last LDL of 30.  He has been continued on atorvastatin  80 mg daily and ezetimibe  10 mg daily.  He continues to remain at goal.  Intermittent claudication as he states that he does have pain on ambulation.  He has been scheduled for ABIs and LE ADS to the bilateral lower extremities to rule out any peripheral arterial disease.       Dispo: Patient to return to clinic to see MD/APP in 3 months or sooner if needed for evaluation of symptoms.  Signed, Lee Kalt, NP

## 2023-10-07 NOTE — Patient Instructions (Signed)
 Medication Instructions:  Your physician recommends that you continue on your current medications as directed. Please refer to the Current Medication list given to you today.   *If you need a refill on your cardiac medications before your next appointment, please call your pharmacy*  Lab Work: No test ordered today  If you have labs (blood work) drawn today and your tests are completely normal, you will receive your results only by: MyChart Message (if you have MyChart) OR A paper copy in the mail If you have any lab test that is abnormal or we need to change your treatment, we will call you to review the results.  Testing/Procedures: Your physician has requested that you have a lower extremity arterial duplex. During this test, ultrasound is used to evaluate arterial blood flow in the legs. Allow one hour for this exam. There are no restrictions or special instructions. This will take place at [address] [facility] [address]) [address], [place] [address]  Please note: We ask at that you not bring children with you during ultrasound (echo/ vascular) testing. Due to room size and safety concerns, children are not allowed in the ultrasound rooms during exams. Our front office staff cannot provide observation of children in our lobby area while testing is being conducted. An adult accompanying a patient to their appointment will only be allowed in the ultrasound room at the discretion of the ultrasound technician under special circumstances. We apologize for any inconvenience.  Your physician has requested that you have an ankle brachial index (ABI). During this test an ultrasound and blood pressure cuff are used to evaluate the arteries that supply the arms and legs with blood.  Allow thirty minutes for this exam.  There are no restrictions or special instructions.  This will take place at [address] [facility] [address]) [address], [place] [address]  Please note: We ask at that you  not bring children with you during ultrasound (echo/ vascular) testing. Due to room size and safety concerns, children are not allowed in the ultrasound rooms during exams. Our front office staff cannot provide observation of children in our lobby area while testing is being conducted. An adult accompanying a patient to their appointment will only be allowed in the ultrasound room at the discretion of the ultrasound technician under special circumstances. We apologize for any inconvenience.   Follow-Up: At [facility], you and your health needs are our priority.  As part of our continuing mission to provide you with exceptional heart care, our providers are all part of one team.  This team includes your primary Cardiologist (physician) and Advanced Practice Providers or APPs (Physician Assistants and Nurse Practitioners) who all work together to provide you with the care you need, when you need it.  Your next appointment:   3 month(s)  Provider:   Antionette Kirks, MD or Ronald Cockayne, NP    We recommend signing up for the patient portal called "MyChart".  Sign up information is provided on this After Visit Summary.  MyChart is used to connect with patients for Virtual Visits (Telemedicine).  Patients are able to view lab/test results, encounter notes, upcoming appointments, etc.  Non-urgent messages can be sent to your provider as well.   To learn more about what you can do with MyChart, go to [email or web address].

## 2023-11-03 ENCOUNTER — Other Ambulatory Visit: Payer: Self-pay

## 2023-11-03 MED ORDER — LISINOPRIL 40 MG PO TABS: 40.0000 mg | ORAL_TABLET | Freq: Every day | ORAL | 3 refills | Status: AC

## 2023-11-10 ENCOUNTER — Ambulatory Visit: Attending: Cardiology

## 2023-11-10 ENCOUNTER — Ambulatory Visit

## 2023-11-10 DIAGNOSIS — I739 Peripheral vascular disease, unspecified: Secondary | ICD-10-CM | POA: Diagnosis not present

## 2023-11-12 LAB — VAS US ABI WITH/WO TBI
Left ABI: 1.24
Right ABI: 1.13

## 2023-11-15 ENCOUNTER — Ambulatory Visit: Payer: Self-pay | Admitting: Cardiology

## 2023-11-15 NOTE — Progress Notes (Signed)
 Right and left ABIs were considered normal.  No evidence of stenosis concerning for decreased blood flow.  Overall reassuring study.

## 2023-11-16 ENCOUNTER — Encounter: Payer: Self-pay | Admitting: Emergency Medicine

## 2023-12-30 ENCOUNTER — Other Ambulatory Visit: Payer: Self-pay | Admitting: Cardiovascular Disease

## 2023-12-30 DIAGNOSIS — I2510 Atherosclerotic heart disease of native coronary artery without angina pectoris: Secondary | ICD-10-CM

## 2024-01-06 NOTE — Progress Notes (Deleted)
 Cardiology Office Note   Date:  01/06/2024  ID:  Jeff Rios, DOB 10-08-1965, MRN [id number] PCP: Pcp, No  [facility] Providers Cardiologist:  Deatrice Cage, MD Cardiology APP:  Visser, Jacquelyn D, PA-C (Inactive) { Click to update primary MD,subspecialty MD or APP then REFRESH:1}    History of Present Illness Jeff Rios is a 58 y.o. male with a past medical history of coronary artery disease status post NSTEMI (03/2019) status post DES to the RCA and LAD, hypertension, hyperlipidemia, former tobacco abuse, idiopathic angioedema, who presents today for follow-up.   Previously been admitted in 03/2019 with inferior ST elevation myocardial infarction.  Reported severe substernal chest discomfort that woke him from sleep.  Cardiac catheterization showed significant two-vessel coronary artery disease with an occluded distal RCA and significant M LAD stenosis.  LV angiogram was suggestive of hypertrophic cardiomyopathy with mild cavity gradient.  He underwent successful PCI/DES to the distal RCA and staged LAD PCI.  Underwent Myoview  Lexiscan  study for shortness of breath in 05/2020 that was unrevealing.  His echo remained stable.  Repeat left heart catheterization in 07/30/2020 revealed previously placed mLAD DES stent was patent, RPDA 30% stenosis, P RCA 30% stenosis, nonstenotic distal RCA previously treated and noted.  No intervention performed widely patent RCA and LAD stents without significant in-stent restenosis and no significant change since recent left heart catheterization.  LV angiography was not performed echo noted to have normal EF.  LVEDP moderately elevated with systolic blood pressure elevated during left heart catheterization.  He was seen 09/04/2021 for preoperative risk for colonoscopy.  Echocardiogram was ordered for DOE however was not completed.  He was then again seen back in clinic 11/2021.  Echo revealed an LVEF of 60 to 60%, no RWMA, moderate LVH, G1 DD,  normal RV SF, mild MR, he was deemed okay to undergo colonoscopy.  06/08/2022 reported unchanged DOE clopidogrel  was stopped since then been a year since stent placement.  He was seen 10/23/2022 reports of lightheadedness and dizziness.  There were no medication changes made and no further testing that was ordered at that time.   He was last seen in clinic 10/03/2023 stating he been doing fairly well from a cardiac perspective.  Denied any chest pain, shortness of breath, dyspnea exertion lightheadedness/dizziness, or near syncope.  Stated he had pain to his bilateral lower extremities when he was walking that starts at the bottom of his feet and moved up his legs.  He was scheduled for ABIs and lower extremity arterial studies to rule out peripheral arterial disease.  He returns to clinic today  ROS: 10 point review of systems has been reviewed and considered negative except ones been listed in HPI  Studies Reviewed     ABI's 11/10/2023 Summary:  Right: Resting right ankle-brachial index is within normal range. The  right toe-brachial index is normal.   Left: Resting left ankle-brachial index is within normal range. The left  toe-brachial index is normal.   LHC 07/29/20 Previously placed Mid LAD drug eluting stent is widely patent. RPDA lesion is 30% stenosed. Prox RCA lesion is 30% stenosed. Non-stenotic Dist RCA lesion was previously treated. 1.  Widely patent RCA and LAD stents with no significant restenosis.  No significant change from most recent cardiac catheterization. 2.  Left ventricular angiography was not performed.  EF was normal by echo. 3.  Left ventricular end-diastolic pressure was moderately elevated in the 20s range but his systemic blood pressure was elevated during cardiac catheterization. Recommendations: Continue  medical therapy and blood pressure control. Coronary Diagrams     Diagnostic Dominance: Right       Echo 06/28/20  1. Left ventricular ejection  fraction, by estimation, is 60 to 65%. The  left ventricle has normal function. The left ventricle has no regional  wall motion abnormalities. There is mild left ventricular hypertrophy.  Left ventricular diastolic parameters  are consistent with Grade I diastolic dysfunction (impaired relaxation).   2. Right ventricular systolic function is normal. The right ventricular  size is normal. There is normal pulmonary artery systolic pressure. The  estimated right ventricular systolic pressure is 28.6 mmHg.   3. The mitral valve is normal in structure. Mild mitral valve  regurgitation.    NM Study 07/03/20 There was no ST segment deviation noted during stress. No T wave inversion was noted during stress. The study is normal. This is a low risk study. The left ventricular ejection fraction is hyperdynamic (>72%). There is no evidence for ischemia   PCI 03/20/2019 RPDA lesion is 40% stenosed. Prox RCA lesion is 30% stenosed. Previously placed Dist RCA drug eluting stent is widely patent. Balloon angioplasty was performed. Mid LAD lesion is 80% stenosed. Post intervention, there is a 0% residual stenosis. A drug-eluting stent was successfully placed using a STENT RESOLUTE ONYX 3.0X15. Successful angioplasty and drug-eluting stent placement to the mid LAD. Recommendations: Continue dual antiplatelet therapy for at least 1 year. Aggressive treatment of risk factors. Likely discharge home tomorrow. He is to stay off work for 2 weeks.    Echo 03/18/19  1. Left ventricular ejection fraction, by visual estimation, is 60 to  65%. The left ventricle has normal function. There is mildly increased  left ventricular hypertrophy.   2. Left ventricular diastolic parameters are consistent with Grade I  diastolic dysfunction (impaired relaxation).   3. Global right ventricle has normal systolic function.The right  ventricular size is normal. No increase in right ventricular wall  thickness.   4.  The mitral valve is normal in structure. No evidence of mitral valve  regurgitation. No evidence of mitral stenosis.   5. The tricuspid valve is normal in structure. Tricuspid valve  regurgitation is not demonstrated.   6. The aortic valve is normal in structure. Aortic valve regurgitation is  not visualized. No evidence of aortic valve sclerosis or stenosis.   7. The pulmonic valve was normal in structure. Pulmonic valve  regurgitation is trivial.   8. TR signal is inadequate for assessing pulmonary artery systolic  pressure.   9. The inferior vena cava is normal in size with greater than 50%  respiratory variability, suggesting right atrial pressure of 3 mmHg.    03/18/19 PCI The left ventricular systolic function is normal. LV end diastolic pressure is normal. The left ventricular ejection fraction is 55-65% by visual estimate. Mid LAD lesion is 80% stenosed. Prox RCA lesion is 30% stenosed. Dist RCA lesion is 100% stenosed. Post intervention, there is a 0% residual stenosis. A drug-eluting stent was successfully placed using a STENT RESOLUTE ONYX 2.25X22. RPDA lesion is 40% stenosed. 1.  Significant two-vessel coronary artery disease.  The culprit is an occluded distal right coronary artery with faint left-to-right collaterals.  The supplied area is not large and that is the likely reason for nondiagnostic EKG changes for inferior STEMI.  There is also significant mid LAD stenosis which appears to be hazy. 2.  Normal LV systolic function with Spade-like appearance of the left ventricle and mid cavity gradient suggestive of  variant form of hypertrophic cardiomyopathy.  Significantly elevated LVEDP in the low 30s. 3.  Successful angioplasty and drug-eluting stent placement to the distal RCA. Recommendations: Continue dual antiplatelet therapy for at least 1 year.  Aggressive treatment of risk factors. Recommend staged PCI of the LAD before hospital discharge.  This is planned for  Monday morning. Risk Assessment/Calculations   No BP recorded.  {Refresh Note OR Click here to enter BP  :1}***       Physical Exam VS:  There were no vitals taken for this visit.       Wt Readings from Last 3 Encounters:  10/07/23 187 lb 9.6 oz (85.1 kg)  10/23/22 190 lb 6.4 oz (86.4 kg)  06/08/22 186 lb (84.4 kg)    GEN: Well nourished, well developed in no acute distress NECK: No JVD; No carotid bruits CARDIAC: ***RRR, no murmurs, rubs, gallops RESPIRATORY:  Clear to auscultation without rales, wheezing or rhonchi  ABDOMEN: Soft, non-tender, non-distended EXTREMITIES:  No edema; No deformity   ASSESSMENT AND PLAN Coronary artery disease status post DES to the RCA mid LAD in 2020 Chronic HFpEF with last LVEF 60 to 65% in 06/2020. Primary hypertension Mixed hyperlipidemia Intermittent claudication    {Are you ordering a CV Procedure (e.g. stress test, cath, DCCV, TEE, etc)?   Press F2        :[id number]}  Dispo: ***  Signed, Quinto Tippy, NP

## 2024-01-07 ENCOUNTER — Ambulatory Visit: Attending: Cardiology | Admitting: Cardiology

## 2024-01-07 DIAGNOSIS — I2510 Atherosclerotic heart disease of native coronary artery without angina pectoris: Secondary | ICD-10-CM

## 2024-01-07 DIAGNOSIS — I5032 Chronic diastolic (congestive) heart failure: Secondary | ICD-10-CM

## 2024-01-07 DIAGNOSIS — I739 Peripheral vascular disease, unspecified: Secondary | ICD-10-CM

## 2024-01-07 DIAGNOSIS — I10 Essential (primary) hypertension: Secondary | ICD-10-CM

## 2024-01-07 DIAGNOSIS — E782 Mixed hyperlipidemia: Secondary | ICD-10-CM

## 2024-04-20 NOTE — Progress Notes (Deleted)
 Cardiology Office Note   Date:  04/20/2024  ID:  Jeff Rios, DOB [birth year], MRN [id number] PCP: Pcp, No  [facility] Providers Cardiologist:  Deatrice Cage, MD Cardiology APP:  Visser, Jacquelyn D, PA-C (Inactive) { Click to update primary MD,subspecialty MD or APP then REFRESH:1}    History of Present Illness Jeff Rios is a 58 y.o. male with past medical history of coronary disease status post NSTEMI (03/2019) status post PCI/DES to the RCA and LAD, hypertension, hyperlipidemia, former tobacco user, idiopathic angioedema, presents today for follow-up.   Previously been admitted in 03/2019 with inferior ST elevation myocardial infarction.  Reported severe substernal chest discomfort that woke him from sleep.  Cardiac catheterization showed significant two-vessel coronary artery disease with an occluded distal RCA and significant M LAD stenosis.  LV angiogram was suggestive of hypertrophic cardiomyopathy with mild cavity gradient.  He underwent successful PCI/DES to the distal RCA and staged LAD PCI.  Underwent Myoview  Lexiscan  study for shortness of breath in 05/2020 that was unrevealing.  His echo remained stable.  Repeat left heart catheterization in 07/30/2020 revealed previously placed mLAD DES stent was patent, RPDA 30% stenosis, P RCA 30% stenosis, nonstenotic distal RCA previously treated and noted.  No intervention performed widely patent RCA and LAD stents without significant in-stent restenosis and no significant change since recent left heart catheterization.  LV angiography was not performed echo noted to have normal EF.  LVEDP moderately elevated with systolic blood pressure elevated during left heart catheterization.  He was seen 09/04/2021 for preoperative risk for colonoscopy.  Echocardiogram was ordered for DOE however was not completed.  He was then again seen back in clinic 11/2021.  Echo revealed an LVEF of 60 to 60%, no RWMA, moderate LVH, G1 DD, normal RV  SF, mild MR, he was deemed okay to undergo colonoscopy.  06/08/2022 reported unchanged DOE clopidogrel  was stopped since then been a year since stent placement.   ROS: ***  Studies Reviewed      LHC 07/29/20 Previously placed Mid LAD drug eluting stent is widely patent. RPDA lesion is 30% stenosed. Prox RCA lesion is 30% stenosed. Non-stenotic Dist RCA lesion was previously treated. 1.  Widely patent RCA and LAD stents with no significant restenosis.  No significant change from most recent cardiac catheterization. 2.  Left ventricular angiography was not performed.  EF was normal by echo. 3.  Left ventricular end-diastolic pressure was moderately elevated in the 20s range but his systemic blood pressure was elevated during cardiac catheterization. Recommendations: Continue medical therapy and blood pressure control. Coronary Diagrams     Diagnostic Dominance: Right       Echo 06/28/20  1. Left ventricular ejection fraction, by estimation, is 60 to 65%. The  left ventricle has normal function. The left ventricle has no regional  wall motion abnormalities. There is mild left ventricular hypertrophy.  Left ventricular diastolic parameters  are consistent with Grade I diastolic dysfunction (impaired relaxation).   2. Right ventricular systolic function is normal. The right ventricular  size is normal. There is normal pulmonary artery systolic pressure. The  estimated right ventricular systolic pressure is 28.6 mmHg.   3. The mitral valve is normal in structure. Mild mitral valve  regurgitation.    NM Study 07/03/20 There was no ST segment deviation noted during stress. No T wave inversion was noted during stress. The study is normal. This is a low risk study. The left ventricular ejection fraction is hyperdynamic (>72%). There is no evidence for  ischemia   PCI 03/20/2019 RPDA lesion is 40% stenosed. Prox RCA lesion is 30% stenosed. Previously placed Dist RCA drug eluting  stent is widely patent. Balloon angioplasty was performed. Mid LAD lesion is 80% stenosed. Post intervention, there is a 0% residual stenosis. A drug-eluting stent was successfully placed using a STENT RESOLUTE ONYX 3.0X15. Successful angioplasty and drug-eluting stent placement to the mid LAD. Recommendations: Continue dual antiplatelet therapy for at least 1 year. Aggressive treatment of risk factors. Likely discharge home tomorrow. He is to stay off work for 2 weeks.    Echo 03/18/19  1. Left ventricular ejection fraction, by visual estimation, is 60 to  65%. The left ventricle has normal function. There is mildly increased  left ventricular hypertrophy.   2. Left ventricular diastolic parameters are consistent with Grade I  diastolic dysfunction (impaired relaxation).   3. Global right ventricle has normal systolic function.The right  ventricular size is normal. No increase in right ventricular wall  thickness.   4. The mitral valve is normal in structure. No evidence of mitral valve  regurgitation. No evidence of mitral stenosis.   5. The tricuspid valve is normal in structure. Tricuspid valve  regurgitation is not demonstrated.   6. The aortic valve is normal in structure. Aortic valve regurgitation is  not visualized. No evidence of aortic valve sclerosis or stenosis.   7. The pulmonic valve was normal in structure. Pulmonic valve  regurgitation is trivial.   8. TR signal is inadequate for assessing pulmonary artery systolic  pressure.   9. The inferior vena cava is normal in size with greater than 50%  respiratory variability, suggesting right atrial pressure of 3 mmHg.    03/18/19 PCI The left ventricular systolic function is normal. LV end diastolic pressure is normal. The left ventricular ejection fraction is 55-65% by visual estimate. Mid LAD lesion is 80% stenosed. Prox RCA lesion is 30% stenosed. Dist RCA lesion is 100% stenosed. Post intervention, there is a  0% residual stenosis. A drug-eluting stent was successfully placed using a STENT RESOLUTE ONYX 2.25X22. RPDA lesion is 40% stenosed. 1.  Significant two-vessel coronary artery disease.  The culprit is an occluded distal right coronary artery with faint left-to-right collaterals.  The supplied area is not large and that is the likely reason for nondiagnostic EKG changes for inferior STEMI.  There is also significant mid LAD stenosis which appears to be hazy. 2.  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  Significantly elevated LVEDP in the low 30s. 3.  Successful angioplasty and drug-eluting stent placement to the distal RCA. Recommendations: Continue dual antiplatelet therapy for at least 1 year.  Aggressive treatment of risk factors. Recommend staged PCI of the LAD before hospital discharge.  This is planned for Monday morning. Risk Assessment/Calculations   No BP recorded.  {Refresh Note OR Click here to enter BP  :1}***       Physical Exam VS:  There were no vitals taken for this visit.       Wt Readings from Last 3 Encounters:  10/07/23 187 lb 9.6 oz (85.1 kg)  10/23/22 190 lb 6.4 oz (86.4 kg)  06/08/22 186 lb (84.4 kg)    GEN: Well nourished, well developed in no acute distress NECK: No JVD; No carotid bruits CARDIAC: ***RRR, no murmurs, rubs, gallops RESPIRATORY:  Clear to auscultation without rales, wheezing or rhonchi  ABDOMEN: Soft, non-tender, non-distended EXTREMITIES:  No edema; No deformity   ASSESSMENT AND PLAN ***    {Are you ordering a CV Procedure (e.g. stress test, cath, DCCV, TEE, etc)?   Press F2        :[id number]}  Dispo: ***  Signed, Luciana Cammarata, NP

## 2024-04-21 ENCOUNTER — Ambulatory Visit: Attending: Cardiology | Admitting: Cardiology
